# Patient Record
Sex: Female | Born: 1960
Health system: Southern US, Community
[De-identification: ages and names within clinical notes are randomized; demographics above are authoritative.]

## PROBLEM LIST (undated history)

## (undated) DIAGNOSIS — M1990 Unspecified osteoarthritis, unspecified site: Secondary | ICD-10-CM

## (undated) DIAGNOSIS — K5792 Diverticulitis of intestine, part unspecified, without perforation or abscess without bleeding: Secondary | ICD-10-CM

## (undated) DIAGNOSIS — F329 Major depressive disorder, single episode, unspecified: Secondary | ICD-10-CM

## (undated) DIAGNOSIS — M25551 Pain in right hip: Secondary | ICD-10-CM

## (undated) DIAGNOSIS — F419 Anxiety disorder, unspecified: Secondary | ICD-10-CM

## (undated) DIAGNOSIS — R51 Headache: Secondary | ICD-10-CM

## (undated) DIAGNOSIS — M549 Dorsalgia, unspecified: Secondary | ICD-10-CM

## (undated) DIAGNOSIS — M5030 Other cervical disc degeneration, unspecified cervical region: Secondary | ICD-10-CM

## (undated) DIAGNOSIS — M542 Cervicalgia: Secondary | ICD-10-CM

## (undated) DIAGNOSIS — G8929 Other chronic pain: Secondary | ICD-10-CM

## (undated) DIAGNOSIS — K219 Gastro-esophageal reflux disease without esophagitis: Secondary | ICD-10-CM

## (undated) DIAGNOSIS — D649 Anemia, unspecified: Secondary | ICD-10-CM

## (undated) DIAGNOSIS — K859 Acute pancreatitis without necrosis or infection, unspecified: Secondary | ICD-10-CM

## (undated) DIAGNOSIS — M797 Fibromyalgia: Secondary | ICD-10-CM

## (undated) DIAGNOSIS — R519 Headache, unspecified: Secondary | ICD-10-CM

## (undated) DIAGNOSIS — I10 Essential (primary) hypertension: Secondary | ICD-10-CM

## (undated) DIAGNOSIS — F32A Depression, unspecified: Secondary | ICD-10-CM

## (undated) HISTORY — DX: Acute pancreatitis without necrosis or infection, unspecified: K85.90

## (undated) HISTORY — PX: OOPHORECTOMY: SHX86

## (undated) HISTORY — DX: Dorsalgia, unspecified: M54.9

## (undated) HISTORY — DX: Other chronic pain: G89.29

## (undated) HISTORY — DX: Cervicalgia: M54.2

---

## 2011-04-19 ENCOUNTER — Emergency Department (HOSPITAL_COMMUNITY)
Admission: EM | Admit: 2011-04-19 | Discharge: 2011-04-19 | Disposition: A | Discharge status: Home / Self Care | Payer: BC Managed Care – PPO | Attending: Emergency Medicine | Admitting: Emergency Medicine

## 2011-04-19 ENCOUNTER — Encounter (HOSPITAL_COMMUNITY): Payer: Self-pay

## 2011-04-19 DIAGNOSIS — L259 Unspecified contact dermatitis, unspecified cause: Secondary | ICD-10-CM | POA: Insufficient documentation

## 2011-04-19 HISTORY — DX: Depression, unspecified: F32.A

## 2011-04-19 HISTORY — DX: Major depressive disorder, single episode, unspecified: F32.9

## 2011-04-19 MED ORDER — PREDNISONE 10 MG PO TABS: ORAL_TABLET | ORAL | Status: DC

## 2011-04-19 MED ORDER — DIPHENHYDRAMINE HCL 25 MG PO TABS: ORAL_TABLET | ORAL | Status: DC

## 2011-04-19 MED ORDER — PREDNISONE 20 MG PO TABS
60.0000 mg | ORAL_TABLET | Freq: Once | ORAL | Status: AC
  Administered 2011-04-19: 60 mg via ORAL
  Filled 2011-04-19: qty 3

## 2011-04-19 MED ORDER — DIPHENHYDRAMINE HCL 25 MG PO CAPS
25.0000 mg | ORAL_CAPSULE | Freq: Once | ORAL | Status: AC
  Administered 2011-04-19: 25 mg via ORAL
  Filled 2011-04-19: qty 1

## 2011-04-19 MED ORDER — PREDNISONE 20 MG PO TABS: ORAL_TABLET | ORAL | Status: DC

## 2011-04-19 MED ORDER — PREDNISONE 10 MG PO TABS: 20.0000 mg | ORAL_TABLET | Freq: Every day | ORAL | Status: DC

## 2011-04-19 NOTE — Discharge Instructions (Signed)
Contact Dermatitis Contact dermatitis is a reaction to certain substances that touch the skin. Contact dermatitis can be either irritant contact dermatitis or allergic contact dermatitis. Irritant contact dermatitis does not require previous exposure to the substance for a reaction to occur.Allergic contact dermatitis only occurs if you have been exposed to the substance before. Upon a repeat exposure, your body reacts to the substance.  CAUSES  Many substances can cause contact dermatitis. Irritant dermatitis is most commonly caused by repeated exposure to mildly irritating substances, such as:  Makeup.   Soaps.   Detergents.   Bleaches.   Acids.   Metal salts, such as nickel.  Allergic contact dermatitis is most commonly caused by exposure to:  Poisonous plants.   Chemicals (deodorants, shampoos).   Jewelry.   Latex.   Neomycin in triple antibiotic cream.   Preservatives in products, including clothing.  SYMPTOMS  The area of skin that is exposed may develop:  Dryness or flaking.   Redness.   Cracks.   Itching.   Pain or a burning sensation.   Blisters.  With allergic contact dermatitis, there may also be swelling in areas such as the eyelids, mouth, or genitals.  DIAGNOSIS  Your caregiver can usually tell what the problem is by doing a physical exam. In cases where the cause is uncertain and an allergic contact dermatitis is suspected, a patch skin test may be performed to help determine the cause of your dermatitis. TREATMENT Treatment includes protecting the skin from further contact with the irritating substance by avoiding that substance if possible. Barrier creams, powders, and gloves may be helpful. Your caregiver may also recommend:  Steroid creams or ointments applied 2 times daily. For best results, soak the rash area in cool water for 20 minutes. Then apply the medicine. Cover the area with a plastic wrap. You can store the steroid cream in the  refrigerator for a "chilly" effect on your rash. That may decrease itching. Oral steroid medicines may be needed in more severe cases.   Antibiotics or antibacterial ointments if a skin infection is present.   Antihistamine lotion or an antihistamine taken by mouth to ease itching.   Lubricants to keep moisture in your skin.   Burow's solution to reduce redness and soreness or to dry a weeping rash. Mix one packet or tablet of solution in 2 cups cool water. Dip a clean washcloth in the mixture, wring it out a bit, and put it on the affected area. Leave the cloth in place for 30 minutes. Do this as often as possible throughout the day.   Taking several cornstarch or baking soda baths daily if the area is too large to cover with a washcloth.  Harsh chemicals, such as alkalis or acids, can cause skin damage that is like a burn. You should flush your skin for 15 to 20 minutes with cold water after such an exposure. You should also seek immediate medical care after exposure. Bandages (dressings), antibiotics, and pain medicine may be needed for severely irritated skin.  HOME CARE INSTRUCTIONS  Avoid the substance that caused your reaction.   Keep the area of skin that is affected away from hot water, soap, sunlight, chemicals, acidic substances, or anything else that would irritate your skin.   Do not scratch the rash. Scratching may cause the rash to become infected.   You may take cool baths to help stop the itching.   Only take over-the-counter or prescription medicines as directed by your caregiver.     See your caregiver for follow-up care as directed to make sure your skin is healing properly.  SEEK MEDICAL CARE IF:   Your condition is not better after 3 days of treatment.   You seem to be getting worse.   You see signs of infection such as swelling, tenderness, redness, soreness, or warmth in the affected area.   You have any problems related to your medicines.  Document Released:  12/20/1999 Document Revised: 12/11/2010 Document Reviewed: 05/27/2010 [facility] Patient Information 2012 [place], [place].   Take your next dose of prednisone tomorrow afternoon and continue to use the Benadryl as needed for relief of itching.  You may also try topical Benadryl for times when you don't want to be drowsy as Benadryl can make you pretty sleepy.  Please call Dr. Margo Aye for further evaluation of this rash if this is not improving.  Expect gradual improvement over the next 5 days, if no improvement is noted please call for recheck appointment.

## 2011-04-19 NOTE — ED Notes (Signed)
Pt presents with red, hot, burning spots on right arm, ribs, buttocks, hip, and head. Pt believes symptoms are shingles.

## 2011-04-21 NOTE — ED Provider Notes (Signed)
History     CSN: [id number]  Arrival date & time 04/19/11  1417   First MD Initiated Contact with Patient 04/19/11 1511      Chief Complaint  Patient presents with  . Possible Shingles     (Consider location/radiation/quality/duration/timing/severity/associated sxs/prior treatment) HPI Comments: Patient presents for evaluation of rash.  She describes a chronic sore papular rash on her posterior neck and in her scalp which has been present for months.  She does endorse increased stress and finds herself picking at her scalp very frequently and does wonder if she is causing self-inflicted lesions to her scalp.  However, she has had a new rash developing on her right lateral chest, her right arm, and right hip and right buttock area.  These new areas started this past week and has been itchy.  She has used calamine lotion with no relief of symptoms.  Patient is a 51 y.o. female presenting with rash. The history is provided by the patient.  Rash  This is a new problem. The current episode started more than 2 days ago. The problem has been gradually worsening. The problem is associated with an unknown (She has spent more time outdoors, but no known exposures to poison ivy or poison oak.) factor. There has been no fever. The rash is present on the scalp, right arm and right buttock (Also affecting right lateral thigh and right chest wall.). The pain is at a severity of 5/10. Associated symptoms include itching, pain and weeping. Associated symptoms comments: The rash on her right arm specifically has been draining clear fluid, but has cleared since applying calamine lotion.. The treatment provided mild relief.    Past Medical History  Diagnosis Date  . Depression     History reviewed. No pertinent past surgical history.  No family history on file.  History  Substance Use Topics  . Smoking status: Never Smoker   . Smokeless tobacco: Not on file  . Alcohol Use: No    OB History    Grav  Para Term Preterm Abortions TAB SAB Ect Mult Living                  Review of Systems  Constitutional: Negative for fever.  HENT: Negative for congestion, sore throat and neck pain.   Eyes: Negative.   Respiratory: Negative for chest tightness and shortness of breath.   Cardiovascular: Negative for chest pain.  Gastrointestinal: Negative for nausea and abdominal pain.  Genitourinary: Negative.   Musculoskeletal: Negative for joint swelling and arthralgias.  Skin: Positive for itching and rash. Negative for wound.  Neurological: Negative for dizziness, weakness, light-headedness, numbness and headaches.  Hematological: Negative.   Psychiatric/Behavioral: Negative.     Allergies  Review of patient's allergies indicates no known allergies.  Home Medications   Current Outpatient Rx  Name Route Sig Dispense Refill  . DIPHENHYDRAMINE HCL 25 MG PO TABS  Take one or two tablets every 6 hours if needed for itching. 30 tablet 0  . PREDNISONE 10 MG PO TABS  Take 6 tabs daily by mouth for 1 day,  Then 5 tabs daily for 2 days,  4 tabs daily for 2 days,  3 tabs daily for 2 days,  2 tabs daily for 2 days,  Then 1 tab daily for 2 days. 36 tablet 0    BP 119/73  Pulse 96  Temp(Src) 98 F (36.7 C) (Oral)  Resp 18  Ht 5\' 3"  (1.6 m)  Wt 150 lb (68.04 kg)  BMI 26.57 kg/m2  SpO2 98%  LMP 04/03/2011  Physical Exam  Nursing note and vitals reviewed. Constitutional: She is oriented to person, place, and time. She appears well-developed and well-nourished.  HENT:  Head: Normocephalic and atraumatic.  Eyes: Conjunctivae are normal.  Neck: Normal range of motion.  Cardiovascular: Normal rate, regular rhythm, normal heart sounds and intact distal pulses.   Pulmonary/Chest: Effort normal and breath sounds normal. She has no wheezes.  Abdominal: Soft. Bowel sounds are normal. There is no tenderness.  Musculoskeletal: Normal range of motion.  Neurological: She is alert and oriented to person,  place, and time.  Skin: Skin is warm and dry.       Small scattered papules noted on posterior scalp and along the hairline at posterior neck most of them scabbed over with no drainage, surrounding erythema pustules or induration.  Linear mostly stable off vesicular-like rash on right volar forearm.  No current drainage, calamine lotion in place.  Similar lesions noted on right lateral thigh and.  Psychiatric: She has a normal mood and affect.    ED Course  Procedures (including critical care time)  Labs Reviewed - No data to display No results found.   1. Contact dermatitis       MDM  Suspect contact dermatitis such as poison ivy or poison oak on patient's right forearm and thigh.  Chronic papular lesions on scalp of unclear etiology.  Could very likely be a chronic condition from nervous habit scratching and picking at scalp.  No signs suggesting infectious etiology.  Patient also denies any new chemicals, shampoos or hair products.  She will be treated with prednisone and Benadryl for the contact dermatitis-like lesions on her arm and thigh.  She may need to see dermatology if her scalp lesions do not clear.  She has been referred for followup as needed.       Candis Musa, PA 04/21/11 1844

## 2011-04-21 NOTE — ED Provider Notes (Signed)
Medical screening examination/treatment/procedure(s) were performed by non-physician practitioner and as supervising physician I was immediately available for consultation/collaboration.   Jonita Hirota, MD 04/21/11 1931 

## 2012-07-22 ENCOUNTER — Ambulatory Visit: Payer: BC Managed Care – PPO | Admitting: *Deleted

## 2013-01-07 ENCOUNTER — Emergency Department (HOSPITAL_COMMUNITY)
Admission: EM | Admit: 2013-01-07 | Discharge: 2013-01-07 | Disposition: A | Discharge status: Home / Self Care | Payer: No Typology Code available for payment source | Attending: Emergency Medicine | Admitting: Emergency Medicine

## 2013-01-07 ENCOUNTER — Encounter (HOSPITAL_COMMUNITY): Payer: Self-pay | Admitting: Emergency Medicine

## 2013-01-07 DIAGNOSIS — Z791 Long term (current) use of non-steroidal anti-inflammatories (NSAID): Secondary | ICD-10-CM | POA: Insufficient documentation

## 2013-01-07 DIAGNOSIS — I10 Essential (primary) hypertension: Secondary | ICD-10-CM | POA: Insufficient documentation

## 2013-01-07 DIAGNOSIS — R519 Headache, unspecified: Secondary | ICD-10-CM

## 2013-01-07 DIAGNOSIS — F329 Major depressive disorder, single episode, unspecified: Secondary | ICD-10-CM | POA: Insufficient documentation

## 2013-01-07 DIAGNOSIS — F3289 Other specified depressive episodes: Secondary | ICD-10-CM | POA: Insufficient documentation

## 2013-01-07 DIAGNOSIS — R51 Headache: Secondary | ICD-10-CM | POA: Insufficient documentation

## 2013-01-07 DIAGNOSIS — Z79899 Other long term (current) drug therapy: Secondary | ICD-10-CM | POA: Insufficient documentation

## 2013-01-07 DIAGNOSIS — F411 Generalized anxiety disorder: Secondary | ICD-10-CM | POA: Insufficient documentation

## 2013-01-07 DIAGNOSIS — R609 Edema, unspecified: Secondary | ICD-10-CM | POA: Insufficient documentation

## 2013-01-07 HISTORY — DX: Anxiety disorder, unspecified: F41.9

## 2013-01-07 HISTORY — DX: Essential (primary) hypertension: I10

## 2013-01-07 MED ORDER — DIAZEPAM 5 MG PO TABS: 5.0000 mg | ORAL_TABLET | Freq: Four times a day (QID) | ORAL | Status: DC | PRN

## 2013-01-07 MED ORDER — OXYCODONE-ACETAMINOPHEN 5-325 MG PO TABS
1.0000 | ORAL_TABLET | Freq: Once | ORAL | Status: AC
  Administered 2013-01-07: 1 via ORAL
  Filled 2013-01-07: qty 1

## 2013-01-07 MED ORDER — PROMETHAZINE HCL 25 MG/ML IJ SOLN
12.5000 mg | Freq: Once | INTRAMUSCULAR | Status: AC
  Administered 2013-01-07: 12.5 mg via INTRAMUSCULAR
  Filled 2013-01-07: qty 1

## 2013-01-07 MED ORDER — OXYCODONE-ACETAMINOPHEN 5-325 MG PO TABS: 1.0000 | ORAL_TABLET | Freq: Four times a day (QID) | ORAL | Status: DC | PRN

## 2013-01-07 NOTE — Discharge Instructions (Signed)
Continue to take the Mobic for inflammation. Take the Percocet and Valium as needed for pain and muscle spasms.

## 2013-01-07 NOTE — ED Notes (Signed)
Pt c/o "knot" to left posterior head area that she noticed a few days ago, pt states that the pain is causing her to have a headache and nausea, denies any injury. Dr. Alvino Chapel at bedside, see edp assessment for further,

## 2013-01-07 NOTE — ED Notes (Signed)
Pt c/o pain/swelling to left posterior head. Pt also c/o nausea. nad noted.

## 2013-01-07 NOTE — ED Provider Notes (Signed)
CSN: [id number]     Arrival date & time 01/07/13  1258 History  This chart was scribed for [employer]. Alvino Chapel, MD,  by Stacy Gardner, ED Scribe. The patient was seen in room APA02/APA02 and the patient's care was started at 2:46 PM.    First MD Initiated Contact with Patient 01/07/13 1440     Chief Complaint  Patient presents with  . Edema  . Nausea   (Consider location/radiation/quality/duration/timing/severity/associated sxs/prior Treatment) The history is provided by the patient, medical records and a relative. No language interpreter was used.   HPI Comments: Brianna [name] is a 53 y.o. female who presents to the Emergency Department complaining of edema to the left base of her head that presented three days ago.  Pt states the edema feels like a "toothache" that radiates to the top of her head.  Pt has tried Excedrin Migraine for symptoms but she is not experiencing any relief. She has also tried massaging the area and applying cool compresses without any relief.She has the associated symptoms of nausea that presented today. Pt [facility] and he is currently taking medications to treat it.  She has a hx of migraines but she denies the pain is similar in nature. Pt denies vomiting.  Past Medical History  Diagnosis Date  . Depression   . Hypertension   . Anxiety    Past Surgical History  Procedure Laterality Date  . C sec    . Cesarean section    . Oophorectomy     No family history on file. History  Substance Use Topics  . Smoking status: Never Smoker   . Smokeless tobacco: Not on file  . Alcohol Use: No   OB History   Grav Para Term Preterm Abortions TAB SAB Ect Mult Living                 Review of Systems  Gastrointestinal: Positive for nausea. Negative for vomiting.  Skin:       Edema to the base of her head  All other systems reviewed and are negative.    Allergies  Review of patient's allergies indicates no known  allergies.  Home Medications   Current Outpatient Rx  Name  Route  Sig  Dispense  Refill  . citalopram (CELEXA) 20 MG tablet   Oral   Take 20 mg by mouth daily.         . hydrochlorothiazide (HYDRODIURIL) 25 MG tablet   Oral   Take 25 mg by mouth daily.         . meloxicam (MOBIC) 7.5 MG tablet   Oral   Take 7.5 mg by mouth daily.         . pantoprazole (PROTONIX) 40 MG tablet   Oral   Take 40 mg by mouth daily.         . diazepam (VALIUM) 5 MG tablet   Oral   Take 1 tablet (5 mg total) by mouth every 6 (six) hours as needed for muscle spasms.   10 tablet   0   . oxyCODONE-acetaminophen (PERCOCET/ROXICET) 5-325 MG per tablet   Oral   Take 1-2 tablets by mouth every 6 (six) hours as needed for severe pain.   8 tablet   0    BP 123/73  Pulse 77  Temp(Src) 97.5 F (36.4 C) (Oral)  Resp 20  Ht 5\' 3"  (1.6 m)  Wt 170 lb (77.111 kg)  BMI 30.12  kg/m2  SpO2 100% Physical Exam  Nursing note and vitals reviewed. Constitutional: She is oriented to person, place, and time. She appears well-developed and well-nourished. No distress.  HENT:  Head: Normocephalic and atraumatic.  Mouth/Throat: Oropharynx is clear and moist.  Tenderness to the base of the left occipital region of the skull  Eyes: Conjunctivae are normal. Pupils are equal, round, and reactive to light. No scleral icterus.  Neck: Neck supple.  Cardiovascular: Normal rate, regular rhythm, normal heart sounds and intact distal pulses.   No murmur heard. Pulmonary/Chest: Effort normal and breath sounds normal. No stridor. No respiratory distress. She has no rales.  Abdominal: Soft. Bowel sounds are normal. She exhibits no distension. There is no tenderness.  Musculoskeletal: Normal range of motion.  Neurological: She is alert and oriented to person, place, and time.  Skin: Skin is warm and dry. No rash noted.   No rash  Psychiatric: She has a normal mood and affect. Her behavior is normal.    ED  Course  Procedures (including critical care time) DIAGNOSTIC STUDIES: Oxygen Saturation is 99% on room air, normal by my interpretation.    COORDINATION OF CARE:  2:50 PM Discussed course of care with pt . Pt understands and agrees.   Labs Review Labs Reviewed - No data to display Imaging Review No results found.  EKG Interpretation   None       MDM   1. Headache    Patient with headache. Likely muscle spasm there is tenderness in the occipital area. Worse with movement. Feels better after treatment. The nausea is likely related to the pain and headache. Feels better after treatment and will be discharged home I personally performed the services described in this documentation, which was scribed in my presence. The recorded information has been reviewed and is accurate.      Jasper Riling. Alvino Chapel, MD 01/07/13 [id number]

## 2013-02-02 ENCOUNTER — Emergency Department (HOSPITAL_COMMUNITY): Payer: No Typology Code available for payment source

## 2013-02-02 ENCOUNTER — Encounter (HOSPITAL_COMMUNITY): Payer: Self-pay | Admitting: Emergency Medicine

## 2013-02-02 ENCOUNTER — Emergency Department (HOSPITAL_COMMUNITY)
Admission: EM | Admit: 2013-02-02 | Discharge: 2013-02-02 | Disposition: A | Discharge status: Home / Self Care | Payer: No Typology Code available for payment source | Attending: Emergency Medicine | Admitting: Emergency Medicine

## 2013-02-02 DIAGNOSIS — F411 Generalized anxiety disorder: Secondary | ICD-10-CM | POA: Insufficient documentation

## 2013-02-02 DIAGNOSIS — F3289 Other specified depressive episodes: Secondary | ICD-10-CM | POA: Insufficient documentation

## 2013-02-02 DIAGNOSIS — M25559 Pain in unspecified hip: Secondary | ICD-10-CM | POA: Insufficient documentation

## 2013-02-02 DIAGNOSIS — F329 Major depressive disorder, single episode, unspecified: Secondary | ICD-10-CM | POA: Insufficient documentation

## 2013-02-02 DIAGNOSIS — I10 Essential (primary) hypertension: Secondary | ICD-10-CM | POA: Insufficient documentation

## 2013-02-02 DIAGNOSIS — R51 Headache: Secondary | ICD-10-CM | POA: Insufficient documentation

## 2013-02-02 DIAGNOSIS — M129 Arthropathy, unspecified: Secondary | ICD-10-CM | POA: Insufficient documentation

## 2013-02-02 DIAGNOSIS — M25551 Pain in right hip: Secondary | ICD-10-CM

## 2013-02-02 DIAGNOSIS — K219 Gastro-esophageal reflux disease without esophagitis: Secondary | ICD-10-CM | POA: Insufficient documentation

## 2013-02-02 DIAGNOSIS — G8929 Other chronic pain: Secondary | ICD-10-CM | POA: Insufficient documentation

## 2013-02-02 DIAGNOSIS — R519 Headache, unspecified: Secondary | ICD-10-CM

## 2013-02-02 DIAGNOSIS — Z792 Long term (current) use of antibiotics: Secondary | ICD-10-CM | POA: Insufficient documentation

## 2013-02-02 DIAGNOSIS — G8921 Chronic pain due to trauma: Secondary | ICD-10-CM | POA: Insufficient documentation

## 2013-02-02 DIAGNOSIS — Z79899 Other long term (current) drug therapy: Secondary | ICD-10-CM | POA: Insufficient documentation

## 2013-02-02 DIAGNOSIS — Z791 Long term (current) use of non-steroidal anti-inflammatories (NSAID): Secondary | ICD-10-CM | POA: Insufficient documentation

## 2013-02-02 HISTORY — DX: Headache: R51

## 2013-02-02 HISTORY — DX: Unspecified osteoarthritis, unspecified site: M19.90

## 2013-02-02 HISTORY — DX: Gastro-esophageal reflux disease without esophagitis: K21.9

## 2013-02-02 HISTORY — DX: Pain in right hip: M25.551

## 2013-02-02 HISTORY — DX: Other chronic pain: G89.29

## 2013-02-02 HISTORY — DX: Headache, unspecified: R51.9

## 2013-02-02 MED ORDER — DIPHENHYDRAMINE HCL 50 MG/ML IJ SOLN
50.0000 mg | Freq: Once | INTRAMUSCULAR | Status: AC
  Administered 2013-02-02: 50 mg via INTRAMUSCULAR
  Filled 2013-02-02: qty 1

## 2013-02-02 MED ORDER — METOCLOPRAMIDE HCL 5 MG/ML IJ SOLN
10.0000 mg | Freq: Once | INTRAMUSCULAR | Status: AC
  Administered 2013-02-02: 10 mg via INTRAMUSCULAR
  Filled 2013-02-02: qty 2

## 2013-02-02 MED ORDER — KETOROLAC TROMETHAMINE 60 MG/2ML IM SOLN
60.0000 mg | Freq: Once | INTRAMUSCULAR | Status: AC
  Administered 2013-02-02: 60 mg via INTRAMUSCULAR
  Filled 2013-02-02: qty 2

## 2013-02-02 MED ORDER — METHOCARBAMOL 500 MG PO TABS: 1000.0000 mg | ORAL_TABLET | Freq: Four times a day (QID) | ORAL | Status: DC | PRN

## 2013-02-02 NOTE — ED Notes (Signed)
Patient with no complaints at this time. Respirations even and unlabored. Skin warm/dry. Discharge instructions reviewed with patient at this time. Patient given opportunity to voice concerns/ask questions. Patient discharged at this time and left Emergency Department with steady gait.   

## 2013-02-02 NOTE — ED Provider Notes (Signed)
CSN: [id number]     Arrival date & time 02/02/13  1126 History   First MD Initiated Contact with Patient 02/02/13 1308     Chief Complaint  Patient presents with  . Headache  . Hip Pain    HPI Pt was seen at 1320. Per pt, c/o gradual onset and persistence of constant acute flair of her chronic migraine headache since last night.  Describes the headache as per her usual chronic migraine headache pain pattern for several years.  Denies headache was sudden or maximal in onset or at any time.  Denies visual changes, no focal motor weakness, no tingling/numbness in extremities, no fevers, no neck pain, no rash.  Pt also c/o gradual onset and persistence of constant right hip "pain" for the past 4 months. Pt states the pain in her hip began after she "slipped" 4 months ago. Denies falling on right hip. Pt has been ambulatory immediately afterwards and since the incident. Pain worsens with palpation of the area and body position changes.States she has [facility], referred to an Orthopedic doctor, but has not f/u due to financial reasons. Denies new injury, no rash, no fevers, no abd pain, no low back pain, no incont/retention of bowel or bladder, no saddle anesthesia, no focal motor weakness, no tingling/numbness in extremities.    Past Medical History  Diagnosis Date  . Depression   . Hypertension   . Anxiety   . GERD (gastroesophageal reflux disease)   . Headache   . Arthritis   . Chronic right hip pain since 09/2012   Past Surgical History  Procedure Laterality Date  . Cesarean section    . Oophorectomy      History  Substance Use Topics  . Smoking status: Never Smoker   . Smokeless tobacco: Not on file  . Alcohol Use: No    Review of Systems ROS: Statement: All systems negative except as marked or noted in the HPI; Constitutional: Negative for fever and chills. ; ; Eyes: Negative for eye pain, redness and discharge. ; ; ENMT: Negative for ear pain,  hoarseness, nasal congestion, sinus pressure and sore throat. ; ; Cardiovascular: Negative for chest pain, palpitations, diaphoresis, dyspnea and peripheral edema. ; ; Respiratory: Negative for cough, wheezing and stridor. ; ; Gastrointestinal: Negative for nausea, vomiting, diarrhea, abdominal pain, blood in stool, hematemesis, jaundice and rectal bleeding. . ; ; Genitourinary: Negative for dysuria, flank pain and hematuria. ; ; Musculoskeletal: +right hip pain. Negative for back pain and neck pain. Negative for swelling and deformity.; ; Skin: Negative for pruritus, rash, abrasions, blisters, bruising and skin lesion.; ; Neuro: +headache. Negative for lightheadedness and neck stiffness. Negative for weakness, altered level of consciousness , altered mental status, extremity weakness, paresthesias, involuntary movement, seizure and syncope.      Allergies  Review of patient's allergies indicates no known allergies.  Home Medications   Current Outpatient Rx  Name  Route  Sig  Dispense  Refill  . cephALEXin (KEFLEX) 500 MG capsule   Oral   Take 500 mg by mouth 3 (three) times daily. Starting 01/09/2013 x 10 days.         . citalopram (CELEXA) 40 MG tablet   Oral   Take 40 mg by mouth daily.         . hydrochlorothiazide (HYDRODIURIL) 25 MG tablet   Oral   Take 25 mg by mouth daily.         Marland Kitchen ibuprofen (ADVIL,MOTRIN)  200 MG tablet   Oral   Take 400 mg by mouth every 6 (six) hours as needed for headache or moderate pain.         . meloxicam (MOBIC) 7.5 MG tablet   Oral   Take 7.5 mg by mouth daily.         . pantoprazole (PROTONIX) 40 MG tablet   Oral   Take 40 mg by mouth daily.          BP 107/82  Pulse 82  Temp(Src) 97.2 F (36.2 C) (Oral)  Resp 16  SpO2 99% Physical Exam 1325: Physical examination:  Nursing notes reviewed; Vital signs and O2 SAT reviewed;  Constitutional: Well developed, Well nourished, Well hydrated, In no acute distress; Head:  Normocephalic,  atraumatic; Eyes: EOMI, PERRL, No scleral icterus; ENMT: Mouth and pharynx normal, Mucous membranes moist; Neck: Supple, Full range of motion, No lymphadenopathy. No meningeal signs.; Cardiovascular: Regular rate and rhythm, No gallop; Respiratory: Breath sounds clear & equal bilaterally, No wheezes.  Speaking full sentences with ease, Normal respiratory effort/excursion; Chest: Nontender, Movement normal; Abdomen: Soft, Nontender, Nondistended, Normal bowel sounds; Genitourinary: No CVA tenderness; Spine:  No midline CS, TS, LS tenderness. +TTP hypertonic left cervical paraspinal muscles;; Extremities: Pulses normal, Pelvis stable. No deformity. No rash. No tenderness, No edema, No calf edema or asymmetry.; Neuro: AA&Ox3, Major CN grossly intact.  Speech clear. Strength 5/5 equal bilat UE's and LE's, including great toe dorsiflexion.  DTR 2/4 equal bilat UE's and LE's.  No gross sensory deficits.  Neg straight leg raises bilat. Climbs on and off stretcher easily by herself. Gait steady..; Skin: Color normal, Warm, Dry.   ED Course  Procedures    EKG Interpretation   None       MDM  MDM Reviewed: previous chart, nursing note and vitals Interpretation: x-ray    Dg Hip Complete Right 02/02/2013   CLINICAL DATA:  Fall  EXAM: RIGHT HIP - COMPLETE 2+ VIEW  COMPARISON:  None.  FINDINGS: No fracture or dislocation.  Unremarkable soft tissues.  IMPRESSION: No acute bony pathology.   Electronically Signed   By: Maryclare Bean M.D.   On: 02/02/2013 14:25    1540:  No acute findings on XR; will have pt f/u with Ortho. Headache improving after meds and wants to go home now. Hypertonic left cervical paraspinal muscles on exam that pt describes as "that feels like a knot." No LAN, no rash, neck supple. No red flags on H&P; will continue to tx symptomatically. Dx and testing d/w pt.  Questions answered.  Verb understanding, agreeable to d/c home with outpt f/u.   Alfonzo Feller, DO 02/05/13 [phone]

## 2013-02-02 NOTE — ED Notes (Signed)
persistent headache since beginning of this month and reports, " knot to back of my left head. [facility] and they reported they thought it was a swollen lymph node. My headaches are getting worse" c/o blurred vision. Pt also reports rt hip pain. Ambulated to triage without difficulty.

## 2013-02-02 NOTE — Discharge Instructions (Signed)
°Emergency Department Resource Guide °1) Find a Doctor and Pay Out of Pocket °Although you won't have to find out who is covered by your insurance plan, it is a good idea to ask around and get recommendations. You will then need to call the office and see if the doctor you have chosen will accept you as a new patient and what types of options they offer for patients who are self-pay. Some doctors offer discounts or will set up payment plans for their patients who do not have insurance, but you will need to ask so you aren't surprised when you get to your appointment. ° °2) Contact Your Local Health Department °Not all health departments have doctors that can see patients for sick visits, but many do, so it is worth a call to see if yours does. If you don't know where your local health department is, you can check in your phone book. The CDC also has a tool to help you locate your state's health department, and many state websites also have listings of all of their local health departments. ° °3) Find a Walk-in Clinic °If your illness is not likely to be very severe or complicated, you may want to try a walk in clinic. These are popping up all over the country in pharmacies, drugstores, and shopping centers. They're usually staffed by nurse practitioners or physician assistants that have been trained to treat common illnesses and complaints. They're usually fairly quick and inexpensive. However, if you have serious medical issues or chronic medical problems, these are probably not your best option. ° °No Primary Care Doctor: °- Call [facility] Connect at  832-8000 - they can help you locate a primary care doctor that  accepts your insurance, provides certain services, etc. °- Physician Referral Service- 1-800-533-3463 ° °Chronic Pain Problems: °Organization         Address  Phone   Notes  °[facility]  (336) 297-2271 Patients need to be referred by their primary care doctor.  ° °Medication  Assistance: °Organization         Address  Phone   Notes  °[place] [facility] [address] °[place], [place] [address] (336) 641-8030 --Must be a resident of [place] °-- Must have NO insurance coverage whatsoever (no Medicaid/ Medicare, etc.) °-- The pt. MUST have a primary care doctor that directs their care regularly and follows them in the community °  °[facility]  (866) 331-1348   °[employer]  (888) 892-1162   ° °Agencies that provide inexpensive medical care: °Organization         Address  Phone   Notes  °[facility]  (336) 832-8035   °[facility]    (336) 832-7272   °[facility] [address] °[place], [place] [address] (336) 832-4777   °[facility] [address], °[place] (336) 271-4999   °[facility]    (336) 373-0678   °[facility]    (336) 272-1050   °[facility] ° [address], [place] Phone:  (336) 832-4444, Fax:  (336) 832-4440 Hours of Operation:  9 am - 6 pm, M-F.  Also accepts Medicaid/Medicare and self-pay.  °[facility] ° [address], [place] Phone: (336) 832-3150, Fax: (336) [phone]. Hours of Operation:  8:30 am - 5:30 pm, M-F.  Also accepts Medicaid and self-pay.  °[facility] [place] [address], [place] Phone: (336) 878-6027   °[facility] [address], [place], [place] (336)723-1848, Ext. 123 Mondays & Thursdays: 7-9 AM.  First 15 patients are seen on a first come, first serve basis. °  ° °Medicaid-accepting [place] Providers: ° °Organization         Address  Phone   Notes  °[facility] [address], [place] (336) 641-2100 Also accepts self-pay patients.  °[facility] [address], [place] ° (336) 856-9996   °[facility] [address], [place]  (336) 288-8857   °[facility] [address] [place] [address], [place] (336) 299-7000   °Veita Bland [address], [place]  ° (336) 373-1557 Only accepts [place] Access Medicaid patients after they have their name applied to their card.  ° °Self-Pay (no insurance) in [place]: ° °Organization         Address  Phone   Notes  °Sickle Cell Patients, [facility] [address], [place] (336) 832-1970   °[facility] [address], [place] (336) 832-4400   °[facility] ° [address], [place] (336) 992-4800   °[facility]/Dr. Osei-Bonsu ° [address] [place] [address], [place] or [address], [place] (336) 841-8500 Phone number for both [place] and [place] locations is the same.  °[facility] [address], [place] (336) 299-0000   °[facility] [address] [place] [address], [place] or [address] (336) 852-7530 °(336) 878-2260   °[facility] [address], [place] (336) 350-1642, phone; (336) 294-5005, fax Sees patients 1st and 3rd Saturday of every month.  Must not qualify for public or private insurance (i.e. Medicaid, Medicare, [place] Health Choice, Veterans' Benefits) • Household income should be no more than 200% of the poverty level •The clinic cannot treat you if you are pregnant or think you are pregnant • Sexually transmitted diseases are not treated at the clinic.  ° ° °Dental Care: °Organization         Address  Phone  Notes  °[place] [facility] [address], [place] (336) 641-6152 Accepts children up to age 21 who are enrolled in Medicaid or [place] Health Choice; pregnant women with a Medicaid card; and children who have applied for Medicaid or [place] Health Choice, but were declined, whose parents can pay a reduced fee at time of service.  °[place]  [facility] [place]  [address], [place] (336) 641-7733 Accepts children up to age 21 who are enrolled in Medicaid or [place] Health Choice; pregnant women with a Medicaid card; and children who have applied for Medicaid or [place] Health Choice, but were declined, whose parents can pay a reduced fee at time of service.  °[facility] ° [address], [place] (336) 641-4533 Patients are seen by appointment only. Walk-ins are not accepted. [facility] will see patients 18 years of age and older. °Monday - Tuesday (8am-5pm) °Most Wednesdays (8:30-5pm) °$30 per visit, cash only  °[facility] ° [address], [place] (336) 641-4533 Patients are seen by appointment only. Walk-ins are not accepted. [facility] will see patients 18 years of age and older. °One   Wednesday Evening (Monthly: Volunteer Based).  $30 per visit, cash only  °[facility]  (919) 537-3737 for adults; Children under age 4, call Graduate Pediatric Dentistry at (919) [phone]. Children aged 4-14, please call (919) [phone] to request a pediatric application. ° Dental services are provided in all areas of dental care including fillings, crowns and bridges, complete and partial dentures, implants, gum treatment, root canals, and extractions. Preventive care is also provided. Treatment is provided to both adults and children. °Patients are selected via a lottery and there is often a waiting list. °  °[facility] [address], °[place] ° (336) 763-8833 [email or web address] °  °[facility] [address], [place], [place] (336)723-1848, Ext. 123 Second and Fourth Thursday of each month, opens at 6:30 AM; Clinic ends at 9 AM.  Patients are seen on a first-come first-served basis, and a limited number are seen during each clinic.  ° °[facility] ° [address], [place], [place] (336) 723-7904    Eligibility Requirements °You must have lived in [place], or [place] counties for at least the last three months. °  You cannot be eligible for state or federal sponsored healthcare insurance, including Veterans Administration, Medicaid, or Medicare. °  You generally cannot be eligible for healthcare insurance through your employer.  °  How to apply: °Eligibility screenings are held every Tuesday and Wednesday afternoon from 1:00 pm until 4:00 pm. You do not need an appointment for the interview!  °[facility] [address], [place], [place] 336-631-2330   °[facility]  336-342-8273   °[facility]  336-703-3100   °[facility]  336-570-6415   ° °Behavioral Health Resources in the Community: °Intensive Outpatient Programs °Organization         Address  Phone  Notes  °[facility] [address], [place], [place] 336-878-6098   °[facility] [address], [place], [place] 336-832-9800   °[facility] [address], [place], [place] ° 336-882-2125   °[facility] [address],  °[place], [place] 1-800-853-5163 or 336-641-4981   °Substance Abuse Resources °Organization         Address  Phone  Notes  °[facility]  336-882-2125   °[facility]  336-784-9470   °[facility]  336-285-9073   °[facility]  336-845-3988   °Residential & Outpatient Substance Abuse Program  1-800-659-3381   °Psychological Services °Organization         Address  Phone  Notes  °[facility]  336- 832-9600   °[facility]  336- 378-7881   °[facility] [address], [place] 1-800-853-5163 or 336-641-4981   ° °Mobile Crisis Teams °Organization         Address  Phone  Notes  °[facility], Mobile Crisis Care Unit  1-877-626-1772   °Assertive °[facility] ° [address]  [place], [place] 336-834-9664   °Sharon DeEsch [address] °[place] 336-554-5454   ° °Self-Help/Support Groups °Organization         Address  Phone             Notes  °[facility] [place] - variety of support groups  336- 373-1402 Call for more information  °Narcotics Anonymous (NA), [facility] [address], °[place]  2 meetings at this location  ° °  Residential Treatment Programs Organization         Address  Phone  Notes  [facility] [address]    [place]  1-(458) 830-6292   [facility]  [address] [place] [address], [place], [place]   [place], [place] (207)400-2968 Admissions: 8am-3pm M-F  [facility] [address]    [place], [place] 062-376-2831   [facility] [address] [place], [place], [place]   [facility] [address],  [place], [place]   [facility] Intensive Outpatient [address], [place], [place]   [facility] [address].,  [place], [place] 1-5701108564 or 646-813-3354   [facility] [address] [place], [place] Accepts Medicaid  [facility] [address]  [place] 1-847-784-5047 Substance Abuse/Addiction Treatment   [facility]         Address  Phone  Notes  [facility]  878-553-6768   Domenic Schwab, PhD [address] [place], [place]   (726)328-4804 or 281-465-7204   [facility] [place], [place] 445-711-4545   [facility] [address], [place], [place] (562)464-2062 Insurance/Medicaid/sponsorship through [facility] [address] [place]                                    [place], [place] [phone] [place] [address] [place] [address], [place] [phone]    Dr. Adele Schilder  [phone]   [facility]. 1) [address] [place] 2) [place] 3)  [address], [place] [phone]  [phone]   [place] 830-296-1851 or (737) 703-3446 (After Hours)       Take the prescription as directed.  Apply moist heat or ice to the area(s) of discomfort, for 15 minutes at a time, several times per day for the next few days.  Do not fall asleep on a heating or ice pack.  Take over the counter tylenol and ibuprofen (OR Excedrin) and benadryl, as directed on packaging, with the prescription given to you today, as needed for headache.  Keep a headache diary, as discussed.  Call your regular medical doctor today to schedule a follow up appointment within the next 3  days.  Call the Orthopedic doctor and the Headache Management doctor today to schedule a follow up appointment within the next week. Return to the Emergency Department immediately sooner if worsening.

## 2013-07-27 ENCOUNTER — Encounter (HOSPITAL_COMMUNITY): Payer: Self-pay | Admitting: Emergency Medicine

## 2013-07-27 ENCOUNTER — Emergency Department (HOSPITAL_COMMUNITY): Payer: No Typology Code available for payment source

## 2013-07-27 ENCOUNTER — Emergency Department (HOSPITAL_COMMUNITY)
Admission: EM | Admit: 2013-07-27 | Discharge: 2013-07-27 | Disposition: A | Discharge status: Home / Self Care | Payer: No Typology Code available for payment source | Attending: Emergency Medicine | Admitting: Emergency Medicine

## 2013-07-27 DIAGNOSIS — R1084 Generalized abdominal pain: Secondary | ICD-10-CM

## 2013-07-27 DIAGNOSIS — K219 Gastro-esophageal reflux disease without esophagitis: Secondary | ICD-10-CM | POA: Insufficient documentation

## 2013-07-27 DIAGNOSIS — F3289 Other specified depressive episodes: Secondary | ICD-10-CM | POA: Insufficient documentation

## 2013-07-27 DIAGNOSIS — F329 Major depressive disorder, single episode, unspecified: Secondary | ICD-10-CM | POA: Insufficient documentation

## 2013-07-27 DIAGNOSIS — R109 Unspecified abdominal pain: Secondary | ICD-10-CM | POA: Insufficient documentation

## 2013-07-27 DIAGNOSIS — Z791 Long term (current) use of non-steroidal anti-inflammatories (NSAID): Secondary | ICD-10-CM | POA: Insufficient documentation

## 2013-07-27 DIAGNOSIS — G8929 Other chronic pain: Secondary | ICD-10-CM | POA: Insufficient documentation

## 2013-07-27 DIAGNOSIS — F411 Generalized anxiety disorder: Secondary | ICD-10-CM | POA: Insufficient documentation

## 2013-07-27 DIAGNOSIS — Z3202 Encounter for pregnancy test, result negative: Secondary | ICD-10-CM | POA: Insufficient documentation

## 2013-07-27 DIAGNOSIS — Z79899 Other long term (current) drug therapy: Secondary | ICD-10-CM | POA: Insufficient documentation

## 2013-07-27 DIAGNOSIS — Z9889 Other specified postprocedural states: Secondary | ICD-10-CM | POA: Insufficient documentation

## 2013-07-27 DIAGNOSIS — I10 Essential (primary) hypertension: Secondary | ICD-10-CM | POA: Insufficient documentation

## 2013-07-27 DIAGNOSIS — M129 Arthropathy, unspecified: Secondary | ICD-10-CM | POA: Insufficient documentation

## 2013-07-27 LAB — URINALYSIS, ROUTINE W REFLEX MICROSCOPIC
BILIRUBIN URINE: NEGATIVE
Glucose, UA: NEGATIVE mg/dL
KETONES UR: NEGATIVE mg/dL
Leukocytes, UA: NEGATIVE
NITRITE: NEGATIVE
PROTEIN: NEGATIVE mg/dL
Specific Gravity, Urine: 1.025 (ref 1.005–1.030)
UROBILINOGEN UA: 0.2 mg/dL (ref 0.0–1.0)
pH: 5 (ref 5.0–8.0)

## 2013-07-27 LAB — COMPREHENSIVE METABOLIC PANEL
ALT: 30 U/L (ref 0–35)
ANION GAP: 12 (ref 5–15)
AST: 30 U/L (ref 0–37)
Albumin: 3.6 g/dL (ref 3.5–5.2)
Alkaline Phosphatase: 90 U/L (ref 39–117)
BILIRUBIN TOTAL: 0.3 mg/dL (ref 0.3–1.2)
BUN: 21 mg/dL (ref 6–23)
CALCIUM: 9.6 mg/dL (ref 8.4–10.5)
CHLORIDE: 106 meq/L (ref 96–112)
CO2: 25 meq/L (ref 19–32)
CREATININE: 0.85 mg/dL (ref 0.50–1.10)
GFR calc Af Amer: 90 mL/min — ABNORMAL LOW (ref >90)
GFR, EST NON AFRICAN AMERICAN: 77 mL/min — AB (ref >90)
Glucose, Bld: 95 mg/dL (ref 70–99)
Potassium: 4.4 mEq/L (ref 3.7–5.3)
Sodium: 143 mEq/L (ref 137–147)
Total Protein: 7.2 g/dL (ref 6.0–8.3)

## 2013-07-27 LAB — CBC WITH DIFFERENTIAL/PLATELET
BASOS PCT: 1 % (ref 0–1)
Basophils Absolute: 0 10*3/uL (ref 0.0–0.1)
EOS PCT: 2 % (ref 0–5)
Eosinophils Absolute: 0.1 10*3/uL (ref 0.0–0.7)
HEMATOCRIT: 38.8 % (ref 36.0–46.0)
HEMOGLOBIN: 13.4 g/dL (ref 12.0–15.0)
LYMPHS PCT: 45 % (ref 12–46)
Lymphs Abs: 2.4 10*3/uL (ref 0.7–4.0)
MCH: 29.7 pg (ref 26.0–34.0)
MCHC: 34.5 g/dL (ref 30.0–36.0)
MCV: 86 fL (ref 78.0–100.0)
MONO ABS: 0.5 10*3/uL (ref 0.1–1.0)
MONOS PCT: 10 % (ref 3–12)
NEUTROS ABS: 2.2 10*3/uL (ref 1.7–7.7)
Neutrophils Relative %: 42 % — ABNORMAL LOW (ref 43–77)
Platelets: 289 10*3/uL (ref 150–400)
RBC: 4.51 MIL/uL (ref 3.87–5.11)
RDW: 12.6 % (ref 11.5–15.5)
WBC: 5.3 10*3/uL (ref 4.0–10.5)

## 2013-07-27 LAB — PREGNANCY, URINE: Preg Test, Ur: NEGATIVE

## 2013-07-27 LAB — URINE MICROSCOPIC-ADD ON

## 2013-07-27 LAB — LIPASE, BLOOD: LIPASE: 19 U/L (ref 11–59)

## 2013-07-27 MED ORDER — ONDANSETRON HCL 4 MG/2ML IJ SOLN
4.0000 mg | Freq: Once | INTRAMUSCULAR | Status: AC
  Administered 2013-07-27: 4 mg via INTRAVENOUS
  Filled 2013-07-27: qty 2

## 2013-07-27 MED ORDER — GI COCKTAIL ~~LOC~~
30.0000 mL | Freq: Once | ORAL | Status: AC
  Administered 2013-07-27: 30 mL via ORAL
  Filled 2013-07-27: qty 30

## 2013-07-27 MED ORDER — ONDANSETRON 4 MG PO TBDP: 4.0000 mg | ORAL_TABLET | Freq: Three times a day (TID) | ORAL | Status: DC | PRN

## 2013-07-27 MED ORDER — SODIUM CHLORIDE 0.9 % IV SOLN
1000.0000 mL | Freq: Once | INTRAVENOUS | Status: AC
  Administered 2013-07-27: 1000 mL via INTRAVENOUS

## 2013-07-27 NOTE — ED Notes (Signed)
Pt states pain was better for 20 minutes following GI cocktail, but now pain has returned.

## 2013-07-27 NOTE — ED Notes (Signed)
Pt alert & oriented x4, stable gait. Patient given discharge instructions, paperwork & prescription(s). Patient  instructed to stop at the registration desk to finish any additional paperwork. Patient verbalized understanding. Pt left department w/ no further questions. 

## 2013-07-27 NOTE — ED Provider Notes (Signed)
CSN: [id number]     Arrival date & time 07/27/13  1232 History   First MD Initiated Contact with Patient 07/27/13 1235     Chief Complaint  Patient presents with  . Abdominal Pain     HPI  Patient presents with concerns of ongoing abdominal pain, nausea, anorexia, weight gain. Symptoms have been present for approximately 7 months and she presents today 2 to sleeplessness last night, increasing discomfort. She denies new fevers, chills, vomiting. She had some relief previously with PPI, but symptoms have come progressive recently. She has seen her primary care physicians, no specialists. She states that prior to the onset of symptoms she was generally well.     Past Medical History  Diagnosis Date  . Depression   . Hypertension   . Anxiety   . GERD (gastroesophageal reflux disease)   . Headache   . Arthritis   . Chronic right hip pain since 09/2012   Past Surgical History  Procedure Laterality Date  . Cesarean section    . Oophorectomy     History reviewed. No pertinent family history. History  Substance Use Topics  . Smoking status: Never Smoker   . Smokeless tobacco: Not on file  . Alcohol Use: No   OB History   Grav Para Term Preterm Abortions TAB SAB Ect Mult Living                 Review of Systems  Constitutional:       Per HPI, otherwise negative  HENT:       Per HPI, otherwise negative  Respiratory:       Per HPI, otherwise negative  Cardiovascular:       Per HPI, otherwise negative  Gastrointestinal: Negative for vomiting.  Endocrine:       Negative aside from HPI  Genitourinary:       Neg aside from HPI   Musculoskeletal:       Per HPI, otherwise negative  Skin: Negative.   Neurological: Negative for syncope.      Allergies  Review of patient's allergies indicates no known allergies.  Home Medications   Prior to Admission medications   Medication Sig Start Date End Date Taking? Authorizing Provider  citalopram (CELEXA) 40 MG tablet  Take 40 mg by mouth daily.   Yes Historical Provider, MD  hydrochlorothiazide (HYDRODIURIL) 25 MG tablet Take 25 mg by mouth daily.   Yes Historical Provider, MD  meloxicam (MOBIC) 7.5 MG tablet Take 7.5 mg by mouth daily.   Yes Historical Provider, MD  pantoprazole (PROTONIX) 40 MG tablet Take 40 mg by mouth daily.   Yes Historical Provider, MD   BP 134/86  Pulse 67  Temp(Src) 97.1 F (36.2 C) (Oral)  Resp 17  Ht 5\' 3"  (1.6 m)  Wt 180 lb (81.647 kg)  BMI 31.89 kg/m2  SpO2 99% Physical Exam  Nursing note and vitals reviewed. Constitutional: She is oriented to person, place, and time. She appears well-developed and well-nourished. No distress.  HENT:  Head: Normocephalic and atraumatic.  Eyes: Conjunctivae and EOM are normal.  Cardiovascular: Normal rate and regular rhythm.   Pulmonary/Chest: Effort normal and breath sounds normal. No stridor. No respiratory distress.  Abdominal: She exhibits no distension. There is no hepatosplenomegaly. There is tenderness in the epigastric area. There is no rigidity, no rebound and no guarding.  Musculoskeletal: She exhibits no edema.  Neurological: She is alert and oriented to person, place, and time. No cranial nerve deficit.  Skin:  Skin is warm and dry.  Psychiatric: She has a normal mood and affect.    ED Course  Procedures (including critical care time) Labs Review Labs Reviewed  CBC WITH DIFFERENTIAL - Abnormal; Notable for the following:    Neutrophils Relative % 42 (*)    All other components within normal limits  COMPREHENSIVE METABOLIC PANEL - Abnormal; Notable for the following:    GFR calc non Af Amer 77 (*)    GFR calc Af Amer 90 (*)    All other components within normal limits  URINALYSIS, ROUTINE W REFLEX MICROSCOPIC - Abnormal; Notable for the following:    Hgb urine dipstick TRACE (*)    All other components within normal limits  URINE MICROSCOPIC-ADD ON - Abnormal; Notable for the following:    Squamous Epithelial / LPF  FEW (*)    All other components within normal limits  LIPASE, BLOOD  PREGNANCY, URINE    Imaging Review Dg Abd Acute W/chest  07/27/2013   CLINICAL DATA:  Abdominal pain and constipation  EXAM: ACUTE ABDOMEN SERIES (ABDOMEN 2 VIEW & CHEST 1 VIEW)  COMPARISON:  02/23/2013  FINDINGS: Normal heart size.  Minimal atelectasis at the lateral left base.  No free intraperitoneal gas. No disproportionate dilatation of bowel. Phleboliths project over the pelvis.  IMPRESSION: Negative abdominal radiographs.  No acute cardiopulmonary disease.   Electronically Signed   By: Maryclare Bean M.D.   On: 07/27/2013 14:55     EKG Interpretation   Date/Time:  Thursday July 27 2013 12:43:22 EDT Ventricular Rate:  69 PR Interval:  147 QRS Duration: 93 QT Interval:  426 QTC Calculation: 456 R Axis:   30 Text Interpretation:  Sinus rhythm Low voltage, precordial leads Sinus  rhythm Low voltage QRS Artifact Abnormal ekg Confirmed by Carmin Muskrat   MD ([id number]) on 07/27/2013 1:09:25 PM     3:12 PM On repeat exam the patient is awake and alert, and in no distress.  I discussed all findings with the patient and her husband.  MDM   Patient presents with almost half year of abdominal pain.  The patient is awake and alert, was soft, non-peritoneal abdomen, no fever, no evidence of distress. Patient's evaluation here is largely reassuring, with only minor lab abnormalities. Patient describes no urinary symptoms, no vomiting, diarrhea or other suggestion of occult infection. With reassuring findings, the patient was provided referral to gastroenterology for further evaluation and management.  Carmin Muskrat, MD 07/27/13 [phone]

## 2013-07-27 NOTE — ED Notes (Signed)
Pt co upper abdominal/epigastric pain x 2 months, [facility]. Pt states burning is worse after eating or drinking. Pt states she has been nauseated and has "hot spells" often. Denies emesis at this time.

## 2013-07-27 NOTE — Discharge Instructions (Signed)
As discussed, your evaluation today has been largely reassuring.  But, it is important that you monitor your condition carefully, and do not hesitate to return to the ED if you develop new, or concerning changes in your condition. ° °Otherwise, please follow-up with your physician for appropriate ongoing care. ° ° °Abdominal Pain °Many things can cause abdominal pain. Usually, abdominal pain is not caused by a disease and will improve without treatment. It can often be observed and treated at home. Your health care provider will do a physical exam and possibly order blood tests and X-rays to help determine the seriousness of your pain. However, in many cases, more time must pass before a clear cause of the pain can be found. Before that point, your health care provider may not know if you need more testing or further treatment. °HOME CARE INSTRUCTIONS  °Monitor your abdominal pain for any changes. The following actions may help to alleviate any discomfort you are experiencing: °· Only take over-the-counter or prescription medicines as directed by your health care provider. °· Do not take laxatives unless directed to do so by your health care provider. °· Try a clear liquid diet (broth, tea, or water) as directed by your health care provider. Slowly move to a bland diet as tolerated. °SEEK MEDICAL CARE IF: °· You have unexplained abdominal pain. °· You have abdominal pain associated with nausea or diarrhea. °· You have pain when you urinate or have a bowel movement. °· You experience abdominal pain that wakes you in the night. °· You have abdominal pain that is worsened or improved by eating food. °· You have abdominal pain that is worsened with eating fatty foods. °· You have a fever. °SEEK IMMEDIATE MEDICAL CARE IF:  °· Your pain does not go away within 2 hours. °· You keep throwing up (vomiting). °· Your pain is felt only in portions of the abdomen, such as the right side or the left lower portion of the  abdomen. °· You pass bloody or black tarry stools. °MAKE SURE YOU: °· Understand these instructions.   °· Will watch your condition.   °· Will get help right away if you are not doing well or get worse.   °Document Released: 10/01/2004 Document Revised: 12/27/2012 Document Reviewed: 08/31/2012 °ExitCare® Patient Information ©2015 [employer]. This information is not intended to replace advice given to you by your health care provider. Make sure you discuss any questions you have with your health care provider. ° °

## 2013-08-03 ENCOUNTER — Telehealth: Payer: Self-pay | Admitting: Gastroenterology

## 2013-08-03 NOTE — Telephone Encounter (Signed)
Patient is scheduled here on 09/04/13 first available, shes been in the ER 3 times, c/o abdominal pain/burning/swelling/blood in stool/constipation, NEW PATIENT, ER  Referral

## 2013-08-04 NOTE — Telephone Encounter (Signed)
I called and scheduled pt for first available urgent OV on 08/11/2013 at 10:30 AM. I told her to keep her phone close by and if we have cancellation early in week next week we can give her a call.

## 2013-08-04 NOTE — Telephone Encounter (Signed)
Offer urgent ov

## 2013-08-04 NOTE — Telephone Encounter (Signed)
I called pt. She said she has a lot of heartburn when she eats, and hurts in the chest and all the way down.  Has a lot of swelling. Irregular BM's and yesterday she saw bright red blood in stool x 1 .  Brianna [name] she is just miserable and would like appt earlier than 09/04/2013.  She said she has ben to the ED at [facility] x 2 in the last several months. She also went to [facility] ED. They did a CT and it was normal. Please advise!

## 2013-08-04 NOTE — Telephone Encounter (Signed)
I have added pt to the schedule on 8/7 at 1030 with AS and cancelled previous OV

## 2013-08-11 ENCOUNTER — Ambulatory Visit (INDEPENDENT_AMBULATORY_CARE_PROVIDER_SITE_OTHER): Payer: Self-pay | Admitting: Gastroenterology

## 2013-08-11 ENCOUNTER — Encounter: Payer: Self-pay | Admitting: Gastroenterology

## 2013-08-11 ENCOUNTER — Other Ambulatory Visit: Payer: Self-pay | Admitting: Internal Medicine

## 2013-08-11 VITALS — BP 106/75 | HR 77 | Temp 98.1°F | Ht 63.0 in | Wt 180.2 lb

## 2013-08-11 DIAGNOSIS — G8929 Other chronic pain: Secondary | ICD-10-CM

## 2013-08-11 DIAGNOSIS — R1013 Epigastric pain: Secondary | ICD-10-CM

## 2013-08-11 DIAGNOSIS — K219 Gastro-esophageal reflux disease without esophagitis: Secondary | ICD-10-CM

## 2013-08-11 DIAGNOSIS — K5900 Constipation, unspecified: Secondary | ICD-10-CM

## 2013-08-11 DIAGNOSIS — Z1211 Encounter for screening for malignant neoplasm of colon: Secondary | ICD-10-CM

## 2013-08-11 MED ORDER — PEG 3350-KCL-NA BICARB-NACL 420 G PO SOLR: 4000.0000 mL | ORAL | Status: DC

## 2013-08-11 MED ORDER — LINACLOTIDE 145 MCG PO CAPS: 145.0000 ug | ORAL_CAPSULE | Freq: Every day | ORAL | Status: DC

## 2013-08-11 NOTE — Progress Notes (Signed)
Primary Care Physician:  No PCP Per Patient Primary Gastroenterologist:  Dr. Gala Romney   Chief Complaint  Patient presents with  . Abdominal Pain    HPI:   Brianna [name] is a 53 year old female referred by the ED secondary to abdominal pain, nausea.. Symptoms over a year. Every time she eats notes "hurting, burning, or uncomfortable". Has gone from a size 8 to a 14 over the past year. States she is bloated, abdomen feels swollen. Told to take fiber. On Protonix once daily since Jan 2015.  Abdominal pain located in epigastric region, feels like something is stuck in there and can't breathe. Notes significant esophageal burning, feels like entire upper abdomen is burning. Any time she eats, it "blows up". Feels nauseated all the time. No significant vomiting. Nausea worsened in past 6 months. Notes history of chronic intermittent constipation. Mostly hard balls, has to manually disimpact. Had blood in stool a few times last week.  No melena. No prior colonoscopy or EGD.   Mobic daily. Every once in awhile will take Excedrin migraine.   Past Medical History  Diagnosis Date  . Depression   . Hypertension   . Anxiety   . GERD (gastroesophageal reflux disease)   . Headache   . Arthritis   . Chronic right hip pain since 09/2012    Past Surgical History  Procedure Laterality Date  . Cesarean section    . Oophorectomy      Current Outpatient Prescriptions  Medication Sig Dispense Refill  . citalopram (CELEXA) 40 MG tablet Take 40 mg by mouth daily.      . hydrochlorothiazide (HYDRODIURIL) 25 MG tablet Take 25 mg by mouth daily.      . meloxicam (MOBIC) 7.5 MG tablet Take 7.5 mg by mouth daily.      . pantoprazole (PROTONIX) 40 MG tablet Take 40 mg by mouth daily.       No current facility-administered medications for this visit.    Allergies as of 08/11/2013  . (No Known Allergies)    Family History  Problem Relation Age of Onset  . Colon cancer Neg Hx   . Ovarian  cancer Mother     History   Social History  . Marital Status: Widowed    Spouse Name: N/A    Number of Children: N/A  . Years of Education: N/A   Occupational History  .      hasn't worked since October 2014. Filing for disability   Social History Main Topics  . Smoking status: Never Smoker   . Smokeless tobacco: Not on file     Comment: Quit in 2009  . Alcohol Use: No  . Drug Use: No  . Sexual Activity: Not on file   Other Topics Concern  . Not on file   Social History Narrative  . No narrative on file    Review of Systems: Gen: see HPI CV: Denies chest pain, heart palpitations, peripheral edema, syncope.  Resp: Denies shortness of breath at rest or with exertion. Denies wheezing or cough.  GI: see HPI GU : Denies urinary burning, urinary frequency, urinary hesitancy MS: right hip pain Derm: Denies rash, itching, dry skin Psych: has anxiety attacks Heme: Denies bruising, bleeding, and enlarged lymph nodes.  Physical Exam: BP 106/75  Pulse 77  Temp(Src) 98.1 F (36.7 C) (Oral)  Ht 5\' 3"  (1.6 m)  Wt 180 lb 3.2 oz (81.738 kg)  BMI 31.93 kg/m2 General:   Alert and oriented.  Pleasant and cooperative. Well-nourished and well-developed.  Head:  Normocephalic and atraumatic. Eyes:  Without icterus, sclera clear and conjunctiva pink.  Ears:  Normal auditory acuity. Nose:  No deformity, discharge,  or lesions. Mouth:  No deformity or lesions, oral mucosa pink.  Lungs:  Clear to auscultation bilaterally. No wheezes, rales, or rhonchi. No distress.  Heart:  S1, S2 present without murmurs appreciated.  Abdomen:  +BS, soft, TTP diffusely but without peritoneal signs.  non-distended. No HSM noted. No guarding or rebound. No masses appreciated.  Rectal:  Deferred  Msk:  Symmetrical without gross deformities. Normal posture. Extremities:  Without clubbing or edema. Neurologic:  Alert and  oriented x4;  grossly normal neurologically. Skin:  Intact without significant  lesions or rashes. Psych:  Alert and cooperative. Normal mood and affect.  Lab Results  Component Value Date   WBC 5.3 07/27/2013   HGB 13.4 07/27/2013   HCT 38.8 07/27/2013   MCV 86.0 07/27/2013   PLT 289 07/27/2013   Lab Results  Component Value Date   ALT 30 07/27/2013   AST 30 07/27/2013   ALKPHOS 90 07/27/2013   BILITOT 0.3 07/27/2013   Imaging at [facility]: requested records.

## 2013-08-11 NOTE — Patient Instructions (Signed)
For reflux: stop Protonix. Start samples of Dexilant once daily each morning. We have provided patient assistance forms to complete for this.  For constipation: start Linzess 1 capsule each morning, 30 minutes before breakfast to avoid diarrhea. We have provided a voucher for a month supply free.   Start taking IBgard daily or as needed.   Please complete blood work when [employer] is processed.  We have scheduled you for a colonoscopy and upper endoscopy with Dr. Gala Romney in the near future!

## 2013-08-14 NOTE — Assessment & Plan Note (Signed)
Start Dexilant once daily.

## 2013-08-14 NOTE — Assessment & Plan Note (Signed)
With low-volume hematochezia, likely benign anorectal source. No prior colonoscopy . Linzess 145 mcg daily prescribed.   Proceed with TCS with Dr. Gala Romney in near future: the risks, benefits, and alternatives have been discussed with the patient in detail. The patient states understanding and desires to proceed.

## 2013-08-14 NOTE — Assessment & Plan Note (Addendum)
53 year old female with epigastric pain since at least Jan 2015, exacerbated significantly by eating and associated with chronic underlying nausea. Bloating symptoms significant. No improvement with Protonix daily since Jan 2015. On Mobic daily. Differentials broad, especially with chronic constipation underlying. In setting of NSAIDs, query gastritis, possible PUD, possible abdominal pain and bloating secondary to chronic constipation, less likely biliary etiology but gallbladder does remain in situ. Needs EGD for further evaluation along with changing PPI and aggressive constipation regimen.   Start Dexilant once daily. Samples provided and patient assistance forms due to lack of insurance (has [employer]).  Linzess 145 mcg voucher. Patient assistance forms provided. Trial of IBgard Obtain outside imaging from [facility] Proceed with upper endoscopy in the near future with Dr. Gala Romney. The risks, benefits, and alternatives have been discussed in detail with patient. They have stated understanding and desire to proceed.

## 2013-08-15 ENCOUNTER — Encounter (HOSPITAL_COMMUNITY): Payer: Self-pay | Admitting: Pharmacy Technician

## 2013-08-15 NOTE — Progress Notes (Signed)
NO PCP

## 2013-08-16 ENCOUNTER — Encounter (HOSPITAL_COMMUNITY): Payer: Self-pay

## 2013-08-22 ENCOUNTER — Encounter (HOSPITAL_COMMUNITY)
Admission: RE | Disposition: A | Discharge status: Home / Self Care | Payer: Self-pay | Source: Ambulatory Visit | Attending: Internal Medicine

## 2013-08-22 ENCOUNTER — Ambulatory Visit (HOSPITAL_COMMUNITY)
Admission: RE | Admit: 2013-08-22 | Discharge: 2013-08-22 | Disposition: A | Discharge status: Home / Self Care | Payer: Self-pay | Source: Ambulatory Visit | Attending: Internal Medicine | Admitting: Internal Medicine

## 2013-08-22 ENCOUNTER — Encounter (HOSPITAL_COMMUNITY): Payer: Self-pay | Admitting: *Deleted

## 2013-08-22 DIAGNOSIS — R1013 Epigastric pain: Secondary | ICD-10-CM | POA: Insufficient documentation

## 2013-08-22 DIAGNOSIS — K449 Diaphragmatic hernia without obstruction or gangrene: Secondary | ICD-10-CM | POA: Insufficient documentation

## 2013-08-22 DIAGNOSIS — F341 Dysthymic disorder: Secondary | ICD-10-CM | POA: Insufficient documentation

## 2013-08-22 DIAGNOSIS — K5730 Diverticulosis of large intestine without perforation or abscess without bleeding: Secondary | ICD-10-CM | POA: Insufficient documentation

## 2013-08-22 DIAGNOSIS — K219 Gastro-esophageal reflux disease without esophagitis: Secondary | ICD-10-CM

## 2013-08-22 DIAGNOSIS — K2100 Gastro-esophageal reflux disease with esophagitis, without bleeding: Secondary | ICD-10-CM | POA: Insufficient documentation

## 2013-08-22 DIAGNOSIS — Z79899 Other long term (current) drug therapy: Secondary | ICD-10-CM | POA: Insufficient documentation

## 2013-08-22 DIAGNOSIS — K222 Esophageal obstruction: Secondary | ICD-10-CM | POA: Insufficient documentation

## 2013-08-22 DIAGNOSIS — K3189 Other diseases of stomach and duodenum: Secondary | ICD-10-CM | POA: Insufficient documentation

## 2013-08-22 DIAGNOSIS — Z1211 Encounter for screening for malignant neoplasm of colon: Secondary | ICD-10-CM

## 2013-08-22 DIAGNOSIS — K921 Melena: Secondary | ICD-10-CM | POA: Insufficient documentation

## 2013-08-22 DIAGNOSIS — K648 Other hemorrhoids: Secondary | ICD-10-CM | POA: Insufficient documentation

## 2013-08-22 DIAGNOSIS — K5900 Constipation, unspecified: Secondary | ICD-10-CM | POA: Insufficient documentation

## 2013-08-22 DIAGNOSIS — R110 Nausea: Secondary | ICD-10-CM | POA: Insufficient documentation

## 2013-08-22 HISTORY — PX: ESOPHAGOGASTRODUODENOSCOPY: SHX5428

## 2013-08-22 HISTORY — PX: COLONOSCOPY: SHX5424

## 2013-08-22 SURGERY — COLONOSCOPY
Anesthesia: Moderate Sedation

## 2013-08-22 MED ORDER — MEPERIDINE HCL 100 MG/ML IJ SOLN
INTRAMUSCULAR | Status: DC | PRN
  Administered 2013-08-22: 50 mg via INTRAVENOUS
  Administered 2013-08-22: 25 mg via INTRAVENOUS

## 2013-08-22 MED ORDER — ONDANSETRON HCL 4 MG/2ML IJ SOLN
INTRAMUSCULAR | Status: DC | PRN
  Administered 2013-08-22: 4 mg via INTRAVENOUS

## 2013-08-22 MED ORDER — ONDANSETRON HCL 4 MG/2ML IJ SOLN
INTRAMUSCULAR | Status: AC
  Filled 2013-08-22: qty 2

## 2013-08-22 MED ORDER — MEPERIDINE HCL 100 MG/ML IJ SOLN
INTRAMUSCULAR | Status: AC
  Filled 2013-08-22: qty 2

## 2013-08-22 MED ORDER — MIDAZOLAM HCL 5 MG/5ML IJ SOLN
INTRAMUSCULAR | Status: DC | PRN
  Administered 2013-08-22 (×4): 1 mg via INTRAVENOUS
  Administered 2013-08-22 (×2): 2 mg via INTRAVENOUS

## 2013-08-22 MED ORDER — MIDAZOLAM HCL 5 MG/5ML IJ SOLN
INTRAMUSCULAR | Status: AC
  Filled 2013-08-22: qty 10

## 2013-08-22 MED ORDER — LIDOCAINE VISCOUS 2 % MT SOLN
OROMUCOSAL | Status: AC
  Filled 2013-08-22: qty 15

## 2013-08-22 MED ORDER — STERILE WATER FOR IRRIGATION IR SOLN
Status: DC | PRN
  Administered 2013-08-22: 13:00:00

## 2013-08-22 MED ORDER — LIDOCAINE VISCOUS 2 % MT SOLN
OROMUCOSAL | Status: DC | PRN
  Administered 2013-08-22: 3 mL via OROMUCOSAL

## 2013-08-22 MED ORDER — SODIUM CHLORIDE 0.9 % IV SOLN
INTRAVENOUS | Status: DC
  Administered 2013-08-22: 12:00:00 via INTRAVENOUS

## 2013-08-22 NOTE — Discharge Instructions (Signed)
Colonoscopy Discharge Instructions  Read the instructions outlined below and refer to this sheet in the next few weeks. These discharge instructions provide you with general information on caring for yourself after you leave the hospital. Your doctor may also give you specific instructions. While your treatment has been planned according to the most current medical practices available, unavoidable complications occasionally occur. If you have any problems or questions after discharge, call Dr. Gala Romney at [phone]. ACTIVITY  You may resume your regular activity, but move at a slower pace for the next 24 hours.   Take frequent rest periods for the next 24 hours.   Walking will help get rid of the air and reduce the bloated feeling in your belly (abdomen).   No driving for 24 hours (because of the medicine (anesthesia) used during the test).    Do not sign any important legal documents or operate any machinery for 24 hours (because of the anesthesia used during the test).  NUTRITION  Drink plenty of fluids.   You may resume your normal diet as instructed by your doctor.   Begin with a light meal and progress to your normal diet. Heavy or fried foods are harder to digest and may make you feel sick to your stomach (nauseated).   Avoid alcoholic beverages for 24 hours or as instructed.  MEDICATIONS  You may resume your normal medications unless your doctor tells you otherwise.  WHAT YOU CAN EXPECT TODAY  Some feelings of bloating in the abdomen.   Passage of more gas than usual.   Spotting of blood in your stool or on the toilet paper.  IF YOU HAD POLYPS REMOVED DURING THE COLONOSCOPY:  No aspirin products for 7 days or as instructed.   No alcohol for 7 days or as instructed.   Eat a soft diet for the next 24 hours.  FINDING OUT THE RESULTS OF YOUR TEST Not all test results are available during your visit. If your test results are not back during the visit, make an appointment  with your caregiver to find out the results. Do not assume everything is normal if you have not heard from your caregiver or the medical facility. It is important for you to follow up on all of your test results.  SEEK IMMEDIATE MEDICAL ATTENTION IF:  You have more than a spotting of blood in your stool.   Your belly is swollen (abdominal distention).   You are nauseated or vomiting.   You have a temperature over 101.  You have abdominal pain or discomfort that is severe or gets worse throughout the day. EGD Discharge instructions Please read the instructions outlined below and refer to this sheet in the next few weeks. These discharge instructions provide you with general information on caring for yourself after you leave the hospital. Your doctor may also give you specific instructions. While your treatment has been planned according to the most current medical practices available, unavoidable complications occasionally occur. If you have any problems or questions after discharge, please call your doctor. ACTIVITY You may resume your regular activity but move at a slower pace for the next 24 hours.  Take frequent rest periods for the next 24 hours.  Walking will help expel (get rid of) the air and reduce the bloated feeling in your abdomen.  No driving for 24 hours (because of the anesthesia (medicine) used during the test).  You may shower.  Do not sign any important legal documents or operate any machinery for 24  hours (because of the anesthesia used during the test).  NUTRITION Drink plenty of fluids.  You may resume your normal diet.  Begin with a light meal and progress to your normal diet.  Avoid alcoholic beverages for 24 hours or as instructed by your caregiver.  MEDICATIONS You may resume your normal medications unless your caregiver tells you otherwise.  WHAT YOU CAN EXPECT TODAY You may experience abdominal discomfort such as a feeling of fullness or gas pains.   FOLLOW-UP Your doctor will discuss the results of your test with you.  SEEK IMMEDIATE MEDICAL ATTENTION IF ANY OF THE FOLLOWING OCCUR: Excessive nausea (feeling sick to your stomach) and/or vomiting.  Severe abdominal pain and distention (swelling).  Trouble swallowing.  Temperature over 101 F (37.8 C).  Rectal bleeding or vomiting of blood.    GERD and constipation information provided  Diverticulosis information provided  Begin Linzess 145 daily as previously recommended  Begin Benefiber 2 teaspoons twice daily  Dexilant 60 mg daily-continue taking  Office visit with [place] in 3 months

## 2013-08-22 NOTE — Op Note (Signed)
[facility] [address] [place], [address]   ENDOSCOPY PROCEDURE REPORT  PATIENT: Brianna, [name]  MR#: [id number] BIRTHDATE: 14-Jun-1960 , 52  yrs. old GENDER: Female ENDOSCOPIST: R.  Garfield Cornea, MD FACP [facility] REFERRED BY:     none PROCEDURE DATE:  08/22/2013 PROCEDURE:     diagnostic EGD  INDICATIONS:    Dyspepsia;  recently improved dramatically with a one-week course of daily Dexilant  INFORMED CONSENT:   The risks, benefits, limitations, alternatives and imponderables have been discussed.  The potential for biopsy, esophogeal dilation, etc. have also been reviewed.  Questions have been answered.  All parties agreeable.  Please see the history and physical in the medical record for more information.  MEDICATIONS:    Versed 5 mg IV and Demerol 75 mg IV in divided doses. Xylocaine gel orally. Zofran 4 mg IV.  DESCRIPTION OF PROCEDURE:   The EG-[id number] ([id number])  endoscope was introduced through the mouth and advanced to the second portion of the duodenum without difficulty or limitations.  The mucosal surfaces were surveyed very carefully during advancement of the scope and upon withdrawal.  Retroflexion view of the proximal stomach and esophagogastric junction was performed.      FINDINGS:  patent /  noncritical Schatzki's ring. 3 quadrant distal esophagealerosions within 4 mm of the GE junction.  no tumor. No Barrett's esophagus. Stomach empty. 3 cm hiatal hernia. Gastric mucosa normal. Patent pylorus.  Normal first and second portion of the duodenum  THERAPEUTIC / DIAGNOSTIC MANEUVERS PERFORMED:  none   COMPLICATIONS:  None  IMPRESSION:  Erosive reflux esophagitis. Noncritical Schatzki's ring and hiatal hernia-not manipulated.  RECOMMENDATIONS:  Continue Dexilant 60 mg daily. See colonoscopy report.    _______________________________ R. Garfield Cornea, MD FACP [facility] eSigned:  R. Garfield Cornea, MD FACP [facility] 08/22/2013 1:23  PM     CC:  PATIENT NAME:  Brianna, [name] MR#: [id number]

## 2013-08-22 NOTE — H&P (View-Only) (Signed)
Primary Care Physician:  No PCP Per Patient Primary Gastroenterologist:  Dr. Gala Romney   Chief Complaint  Patient presents with  . Abdominal Pain    HPI:   Brianna [name] is a 53 year old female referred by the ED secondary to abdominal pain, nausea.. Symptoms over a year. Every time she eats notes "hurting, burning, or uncomfortable". Has gone from a size 8 to a 14 over the past year. States she is bloated, abdomen feels swollen. Told to take fiber. On Protonix once daily since Jan 2015.  Abdominal pain located in epigastric region, feels like something is stuck in there and can't breathe. Notes significant esophageal burning, feels like entire upper abdomen is burning. Any time she eats, it "blows up". Feels nauseated all the time. No significant vomiting. Nausea worsened in past 6 months. Notes history of chronic intermittent constipation. Mostly hard balls, has to manually disimpact. Had blood in stool a few times last week.  No melena. No prior colonoscopy or EGD.   Mobic daily. Every once in awhile will take Excedrin migraine.   Past Medical History  Diagnosis Date  . Depression   . Hypertension   . Anxiety   . GERD (gastroesophageal reflux disease)   . Headache   . Arthritis   . Chronic right hip pain since 09/2012    Past Surgical History  Procedure Laterality Date  . Cesarean section    . Oophorectomy      Current Outpatient Prescriptions  Medication Sig Dispense Refill  . citalopram (CELEXA) 40 MG tablet Take 40 mg by mouth daily.      . hydrochlorothiazide (HYDRODIURIL) 25 MG tablet Take 25 mg by mouth daily.      . meloxicam (MOBIC) 7.5 MG tablet Take 7.5 mg by mouth daily.      . pantoprazole (PROTONIX) 40 MG tablet Take 40 mg by mouth daily.       No current facility-administered medications for this visit.    Allergies as of 08/11/2013  . (No Known Allergies)    Family History  Problem Relation Age of Onset  . Colon cancer Neg Hx   . Ovarian  cancer Mother     History   Social History  . Marital Status: Widowed    Spouse Name: N/A    Number of Children: N/A  . Years of Education: N/A   Occupational History  .      hasn't worked since October 2014. Filing for disability   Social History Main Topics  . Smoking status: Never Smoker   . Smokeless tobacco: Not on file     Comment: Quit in 2009  . Alcohol Use: No  . Drug Use: No  . Sexual Activity: Not on file   Other Topics Concern  . Not on file   Social History Narrative  . No narrative on file    Review of Systems: Gen: see HPI CV: Denies chest pain, heart palpitations, peripheral edema, syncope.  Resp: Denies shortness of breath at rest or with exertion. Denies wheezing or cough.  GI: see HPI GU : Denies urinary burning, urinary frequency, urinary hesitancy MS: right hip pain Derm: Denies rash, itching, dry skin Psych: has anxiety attacks Heme: Denies bruising, bleeding, and enlarged lymph nodes.  Physical Exam: BP 106/75  Pulse 77  Temp(Src) 98.1 F (36.7 C) (Oral)  Ht 5\' 3"  (1.6 m)  Wt 180 lb 3.2 oz (81.738 kg)  BMI 31.93 kg/m2 General:   Alert and oriented.  Pleasant and cooperative. Well-nourished and well-developed.  Head:  Normocephalic and atraumatic. Eyes:  Without icterus, sclera clear and conjunctiva pink.  Ears:  Normal auditory acuity. Nose:  No deformity, discharge,  or lesions. Mouth:  No deformity or lesions, oral mucosa pink.  Lungs:  Clear to auscultation bilaterally. No wheezes, rales, or rhonchi. No distress.  Heart:  S1, S2 present without murmurs appreciated.  Abdomen:  +BS, soft, TTP diffusely but without peritoneal signs.  non-distended. No HSM noted. No guarding or rebound. No masses appreciated.  Rectal:  Deferred  Msk:  Symmetrical without gross deformities. Normal posture. Extremities:  Without clubbing or edema. Neurologic:  Alert and  oriented x4;  grossly normal neurologically. Skin:  Intact without significant  lesions or rashes. Psych:  Alert and cooperative. Normal mood and affect.  Lab Results  Component Value Date   WBC 5.3 07/27/2013   HGB 13.4 07/27/2013   HCT 38.8 07/27/2013   MCV 86.0 07/27/2013   PLT 289 07/27/2013   Lab Results  Component Value Date   ALT 30 07/27/2013   AST 30 07/27/2013   ALKPHOS 90 07/27/2013   BILITOT 0.3 07/27/2013   Imaging at [facility]: requested records.

## 2013-08-22 NOTE — Op Note (Signed)
[facility] [address] [place], [address]   COLONOSCOPY PROCEDURE REPORT  PATIENT: Brianna [name], Brianna [name]  MR#:         [id number] BIRTHDATE: [birth year] , 52  yrs. old GENDER: Female ENDOSCOPIST: R.  Garfield Cornea, MD FACP [facility] REFERRED BY:     none PROCEDURE DATE:  08/22/2013 PROCEDURE:     Diagnostic colonoscopy  INDICATIONS: paper hematochezia; constipation  INFORMED CONSENT:  The risks, benefits, alternatives and imponderables including but not limited to bleeding, perforation as well as the possibility of a missed lesion have been reviewed.  The potential for biopsy, lesion removal, etc. have also been discussed.  Questions have been answered.  All parties agreeable. Please see the history and physical in the medical record for more information.  MEDICATIONS: Versed 8 mg IV and Demerol 75 mg IV in divided doses. Zofran 4 mg IV  DESCRIPTION OF PROCEDURE:  After a digital rectal exam was performed, the EC-[id number] ([id number])  colonoscope was advanced from the anus through the rectum and colon to the area of the cecum, ileocecal valve and appendiceal orifice.  The cecum was deeply intubated.  These structures were well-seen and photographed for the record.  From the level of the cecum and ileocecal valve, the scope was slowly and cautiously withdrawn.  The mucosal surfaces were carefully surveyed utilizing scope tip deflection to facilitate fold flattening as needed.  The scope was pulled down into the rectum where a thorough examination was performed.    FINDINGS:  Adequate preparation. Minimal anal canal hemorrhoids; Rectal vault small-unable to retroflex, however, mucosa seen well on-face otherwise, rectal mucosa appeared normal. Scattered left-sided diverticula; otherwise, the remainder of the colonic mucosa appeared normal.  THERAPEUTIC / DIAGNOSTIC MANEUVERS PERFORMED:  none  COMPLICATIONS: none  CECAL WITHDRAWAL TIME:  6 minutes  IMPRESSION:   Colonic diverticulosis; paper hematochezia likely from benign anorectal irritation.  RECOMMENDATIONS:   Begin Linzess 145 daily as previously recommended. Add Benefiber 2 teaspoons twice a day to her regimen. Office visit in 3 months.   _______________________________ eSigned:  R. Garfield Cornea, MD FACP [facility] 08/22/2013 1:44 PM   CC:

## 2013-08-22 NOTE — Interval H&P Note (Signed)
History and Physical Interval Note:  08/22/2013 1:00 PM  Brianna [name]  has presented today for surgery, with the diagnosis of GERD, ABDOMINAL PAIN, SCREENING COLONOSCOPY  The various methods of treatment have been discussed with the patient and family. After consideration of risks, benefits and other options for treatment, the patient has consented to  Procedure(s) with comments: COLONOSCOPY (N/A) - 1245 ESOPHAGOGASTRODUODENOSCOPY (EGD) (N/A) as a surgical intervention .  The patient's history has been reviewed, patient examined, no change in status, stable for surgery.  I have reviewed the patient's chart and labs.  Questions were answered to the patient's satisfaction.     Brianna [name]  No change. EGD colonoscopy per plan.The risks, benefits, limitations, imponderables and alternatives regarding both EGD and colonoscopy have been reviewed with the patient. Questions have been answered. All parties agreeable.

## 2013-08-25 ENCOUNTER — Encounter (HOSPITAL_COMMUNITY): Payer: Self-pay | Admitting: Internal Medicine

## 2013-08-28 ENCOUNTER — Telehealth: Payer: Self-pay | Admitting: *Deleted

## 2013-08-28 NOTE — Telephone Encounter (Signed)
Gave #3 bottles of dexilant. Pt is aware. She is not sure if she got the patient assistance forms for dexilant or not. I have put more paperwork in the bag with the samples. Pt also requesting information about diverticulosis. Answered her questions and I also included a handout of diverticulosis.

## 2013-08-28 NOTE — Telephone Encounter (Signed)
Pt called stating she only has 5 pills of dexilant left and she would like more. Please advise [phone]

## 2013-08-29 NOTE — Telephone Encounter (Signed)
noted 

## 2013-09-04 ENCOUNTER — Ambulatory Visit: Payer: Self-pay | Admitting: Gastroenterology

## 2013-09-06 ENCOUNTER — Encounter: Payer: Self-pay | Admitting: Internal Medicine

## 2013-09-06 ENCOUNTER — Ambulatory Visit: Payer: Self-pay | Attending: Internal Medicine | Admitting: Internal Medicine

## 2013-09-06 VITALS — BP 120/83 | HR 80 | Temp 98.1°F | Resp 16 | Ht 63.0 in | Wt 178.0 lb

## 2013-09-06 DIAGNOSIS — F3289 Other specified depressive episodes: Secondary | ICD-10-CM | POA: Insufficient documentation

## 2013-09-06 DIAGNOSIS — G8929 Other chronic pain: Secondary | ICD-10-CM | POA: Insufficient documentation

## 2013-09-06 DIAGNOSIS — M129 Arthropathy, unspecified: Secondary | ICD-10-CM | POA: Insufficient documentation

## 2013-09-06 DIAGNOSIS — K219 Gastro-esophageal reflux disease without esophagitis: Secondary | ICD-10-CM | POA: Insufficient documentation

## 2013-09-06 DIAGNOSIS — M25551 Pain in right hip: Secondary | ICD-10-CM

## 2013-09-06 DIAGNOSIS — F411 Generalized anxiety disorder: Secondary | ICD-10-CM | POA: Insufficient documentation

## 2013-09-06 DIAGNOSIS — M545 Low back pain, unspecified: Secondary | ICD-10-CM | POA: Insufficient documentation

## 2013-09-06 DIAGNOSIS — M542 Cervicalgia: Secondary | ICD-10-CM | POA: Insufficient documentation

## 2013-09-06 DIAGNOSIS — M25559 Pain in unspecified hip: Secondary | ICD-10-CM | POA: Insufficient documentation

## 2013-09-06 DIAGNOSIS — F329 Major depressive disorder, single episode, unspecified: Secondary | ICD-10-CM | POA: Insufficient documentation

## 2013-09-06 DIAGNOSIS — R51 Headache: Secondary | ICD-10-CM | POA: Insufficient documentation

## 2013-09-06 DIAGNOSIS — M79609 Pain in unspecified limb: Secondary | ICD-10-CM | POA: Insufficient documentation

## 2013-09-06 DIAGNOSIS — I10 Essential (primary) hypertension: Secondary | ICD-10-CM | POA: Insufficient documentation

## 2013-09-06 MED ORDER — CYCLOBENZAPRINE HCL 10 MG PO TABS: 10.0000 mg | ORAL_TABLET | Freq: Three times a day (TID) | ORAL | Status: DC | PRN

## 2013-09-06 NOTE — Progress Notes (Signed)
Patient ID: Brianna [name], female   DOB: 04/20/1960, 53 y.o.   MRN: [id number]  [id number]:[id number]  [id number]  DOB - [birth year]  CC:  Chief Complaint  Patient presents with  . Establish Care       HPI: Brianna [name] is a 53 y.o. female here today to establish medical care.  Patient reports that she was having severe acid reflux and was sent to GI.  She was found to have diverticulosis and a small hernia causing her to have acid reflux.  She was given dexilant and linezess.  She reports the linzess has been causing diarrhea so she stopped taking it, she plans to follow up with the GI doctor today about this.    Right hip pain--constant pain for 3 months.  Has had normal xrays.  She has been to several doctors about this pain and was given meloxicam.  Now she c/o of left hip pain for three weeks.  Has tried heating pads. Bilateral foot pain when getting up in morning-achy pain.  Lower back pain that feels like it gives way and feels like she may be falling.  Xrays of hip and back in past,  Neck pain as well, had bulging disc in past and has had cortisone injections.  Difficulty sleeping due to pain.  No Known Allergies Past Medical History  Diagnosis Date  . Depression   . Hypertension   . Anxiety   . GERD (gastroesophageal reflux disease)   . Headache   . Arthritis   . Chronic right hip pain since 09/2012   Current Outpatient Prescriptions on File Prior to Visit  Medication Sig Dispense Refill  . citalopram (CELEXA) 40 MG tablet Take 40 mg by mouth daily.      Marland Kitchen dexlansoprazole (DEXILANT) 60 MG capsule Take 60 mg by mouth daily.      . hydrochlorothiazide (HYDRODIURIL) 25 MG tablet Take 25 mg by mouth daily.      . polyethylene glycol-electrolytes (TRILYTE) 420 G solution Take 4,000 mLs by mouth as directed.  4000 mL  0   No current facility-administered medications on file prior to visit.   Family History  Problem Relation Age of Onset  . Colon cancer Neg Hx   . Ovarian  cancer Mother    History   Social History  . Marital Status: Widowed    Spouse Name: N/A    Number of Children: N/A  . Years of Education: N/A   Occupational History  .      hasn't worked since October 2014. Filing for disability   Social History Main Topics  . Smoking status: Never Smoker   . Smokeless tobacco: Not on file     Comment: Quit in 2009  . Alcohol Use: No  . Drug Use: No  . Sexual Activity: Not on file   Other Topics Concern  . Not on file   Social History Narrative  . No narrative on file   Review of Systems  Gastrointestinal: Positive for abdominal pain. Negative for diarrhea and constipation.  Genitourinary: Negative.   Musculoskeletal: Positive for back pain and neck pain. Negative for falls.       Hip pain   Neurological: Negative for dizziness.      Objective:   Filed Vitals:   09/06/13 1139  BP: 120/83  Pulse: 80  Temp: 98.1 F (36.7 C)  Resp: 16   Physical Exam  Cardiovascular: Normal rate, regular rhythm and normal heart sounds.   Pulmonary/Chest: Effort normal and breath  sounds normal.  Abdominal: Soft. Bowel sounds are normal.  Musculoskeletal: She exhibits no edema and no tenderness.  Normal ROM of left hip, patient is very tense with passive ROM of right hip   Skin: Skin is warm.     Lab Results  Component Value Date   WBC 5.3 07/27/2013   HGB 13.4 07/27/2013   HCT 38.8 07/27/2013   MCV 86.0 07/27/2013   PLT 289 07/27/2013   Lab Results  Component Value Date   CREATININE 0.85 07/27/2013   BUN 21 07/27/2013   NA 143 07/27/2013   K 4.4 07/27/2013   CL 106 07/27/2013   CO2 25 07/27/2013    No results found for this basename: HGBA1C   Lipid Panel  No results found for this basename: chol, trig, hdl, cholhdl, vldl, ldlcalc       Assessment and plan:   Kanyia was seen today for establish care.  Diagnoses and associated orders for this visit:  Hip pain, chronic, right - MR Hip Right Wo Contrast;  Future - cyclobenzaprine (FLEXERIL) 10 MG tablet; Take 1 tablet (10 mg total) by mouth 3 (three) times daily as needed for muscle spasms.   Return if symptoms worsen or fail to improve.  The patient was given clear instructions to go to ER or return to medical center if symptoms don't improve, worsen or new problems develop. The patient verbalized understanding.   Chari Manning, NP-C [facility] [phone] 09/11/2013, 2:12 PM

## 2013-09-06 NOTE — Progress Notes (Signed)
Pt is here to establish care. Pt reports having severe chronic pain in her hips and legs. Pt wants to be tested for fibromyalgia.

## 2013-09-06 NOTE — Patient Instructions (Signed)
Osteoarthritis Osteoarthritis is a disease that causes soreness and inflammation of a joint. It occurs when the cartilage at the affected joint wears down. Cartilage acts as a cushion, covering the ends of bones where they meet to form a joint. Osteoarthritis is the most common form of arthritis. It often occurs in older people. The joints affected most often by this condition include those in the:  Ends of the fingers.  Thumbs.  Neck.  Lower back.  Knees.  Hips. CAUSES  Over time, the cartilage that covers the ends of bones begins to wear away. This causes bone to rub on bone, producing pain and stiffness in the affected joints.  RISK FACTORS Certain factors can increase your chances of having osteoarthritis, including:  Older age.  Excessive body weight.  Overuse of joints.  Previous joint injury. SIGNS AND SYMPTOMS   Pain, swelling, and stiffness in the joint.  Over time, the joint may lose its normal shape.  Small deposits of bone (osteophytes) may grow on the edges of the joint.  Bits of bone or cartilage can break off and float inside the joint space. This may cause more pain and damage. DIAGNOSIS  Your health care provider will do a physical exam and ask about your symptoms. Various tests may be ordered, such as:  X-rays of the affected joint.  An MRI scan.  Blood tests to rule out other types of arthritis.  Joint fluid tests. This involves using a needle to draw fluid from the joint and examining the fluid under a microscope. TREATMENT  Goals of treatment are to control pain and improve joint function. Treatment plans may include:  A prescribed exercise program that allows for rest and joint relief.  A weight control plan.  Pain relief techniques, such as:  Properly applied heat and cold.  Electric pulses delivered to nerve endings under the skin (transcutaneous electrical nerve stimulation [TENS]).  Massage.  Certain nutritional  supplements.  Medicines to control pain, such as:  Acetaminophen.  Nonsteroidal anti-inflammatory drugs (NSAIDs), such as naproxen.  Narcotic or central-acting agents, such as tramadol.  Corticosteroids. These can be given orally or as an injection.  Surgery to reposition the bones and relieve pain (osteotomy) or to remove loose pieces of bone and cartilage. Joint replacement may be needed in advanced states of osteoarthritis. HOME CARE INSTRUCTIONS   Take medicines only as directed by your health care provider.  Maintain a healthy weight. Follow your health care provider's instructions for weight control. This may include dietary instructions.  Exercise as directed. Your health care provider can recommend specific types of exercise. These may include:  Strengthening exercises. These are done to strengthen the muscles that support joints affected by arthritis. They can be performed with weights or with exercise bands to add resistance.  Aerobic activities. These are exercises, such as brisk walking or low-impact aerobics, that get your heart pumping.  Range-of-motion activities. These keep your joints limber.  Balance and agility exercises. These help you maintain daily living skills.  Rest your affected joints as directed by your health care provider.  Keep all follow-up visits as directed by your health care provider. SEEK MEDICAL CARE IF:   Your skin turns red.  You develop a rash in addition to your joint pain.  You have worsening joint pain.  You have a fever along with joint or muscle aches. SEEK IMMEDIATE MEDICAL CARE IF:  You have a significant loss of weight or appetite.  You have night sweats. FOR MORE  INFORMATION  °· National Institute of Arthritis and Musculoskeletal and Skin Diseases: www.niams.nih.gov °· National Institute on Aging: www.nia.nih.gov °· American College of Rheumatology: www.rheumatology.org °Document Released: 12/22/2004 Document Revised:  05/08/2013 Document Reviewed: 08/29/2012 °ExitCare® Patient Information ©2015 [employer]. This information is not intended to replace advice given to you by your health care provider. Make sure you discuss any questions you have with your health care provider. °Hip Pain °Your hip is the joint between your upper legs and your lower pelvis. The bones, cartilage, tendons, and muscles of your hip joint perform a lot of work each day supporting your body weight and allowing you to move around. °Hip pain can range from a minor ache to severe pain in one or both of your hips. Pain may be felt on the inside of the hip joint near the groin, or the outside near the buttocks and upper thigh. You may have swelling or stiffness as well.  °HOME CARE INSTRUCTIONS  °· Take medicines only as directed by your health care provider. °· Apply ice to the injured area: °· Put ice in a plastic bag. °· Place a towel between your skin and the bag. °· Leave the ice on for 15-20 minutes at a time, 3-4 times a day. °· Keep your leg raised (elevated) when possible to lessen swelling. °· Avoid activities that cause pain. °· Follow specific exercises as directed by your health care provider. °· Sleep with a pillow between your legs on your most comfortable side. °· Record how often you have hip pain, the location of the pain, and what it feels like. °SEEK MEDICAL CARE IF:  °· You are unable to put weight on your leg. °· Your hip is red or swollen or very tender to touch. °· Your pain or swelling continues or worsens after 1 week. °· You have increasing difficulty walking. °· You have a fever. °SEEK IMMEDIATE MEDICAL CARE IF:  °· You have fallen. °· You have a sudden increase in pain and swelling in your hip. °MAKE SURE YOU:  °· Understand these instructions. °· Will watch your condition. °· Will get help right away if you are not doing well or get worse. °Document Released: 06/11/2009 Document Revised: 05/08/2013 Document Reviewed:  08/18/2012 °ExitCare® Patient Information ©2015 [employer]. This information is not intended to replace advice given to you by your health care provider. Make sure you discuss any questions you have with your health care provider. ° °

## 2013-09-11 ENCOUNTER — Encounter: Payer: Self-pay | Admitting: Internal Medicine

## 2013-09-15 ENCOUNTER — Ambulatory Visit (HOSPITAL_COMMUNITY)
Admission: RE | Admit: 2013-09-15 | Discharge: 2013-09-15 | Disposition: A | Discharge status: Home / Self Care | Payer: No Typology Code available for payment source | Source: Ambulatory Visit | Attending: Internal Medicine | Admitting: Internal Medicine

## 2013-09-15 DIAGNOSIS — M25551 Pain in right hip: Secondary | ICD-10-CM

## 2013-09-15 DIAGNOSIS — G8929 Other chronic pain: Secondary | ICD-10-CM | POA: Insufficient documentation

## 2013-09-15 DIAGNOSIS — M25559 Pain in unspecified hip: Secondary | ICD-10-CM | POA: Insufficient documentation

## 2013-09-18 ENCOUNTER — Telehealth: Payer: Self-pay | Admitting: Internal Medicine

## 2013-09-18 NOTE — Telephone Encounter (Signed)
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

## 2013-09-19 NOTE — Telephone Encounter (Signed)
Pt requesting MRI results. Please post results

## 2013-09-20 NOTE — Telephone Encounter (Signed)
Pt. Is calling back again in regards to MRI, pt. Was notified PCP is out for the week... Pt. Would like to receive a call from nurse not only for MRI results but also to discuss the medication she was prescribed (Flexeril).....Marland Kitchenplease follow up with patient

## 2013-09-25 NOTE — Telephone Encounter (Signed)
MRI results revealed left trochanteric bursitis, which is iflammation/fluid of the bursa in her hip.  She may have a short course of NSAIDS such as Meloxicam 7.5mg  QD for 10-14 days. If that does not improve the pain then she may have to be referred to Sports Medicine for steroid injections to relieve inflammation.

## 2013-09-26 ENCOUNTER — Telehealth: Payer: Self-pay | Admitting: Emergency Medicine

## 2013-09-26 DIAGNOSIS — M719 Bursopathy, unspecified: Secondary | ICD-10-CM

## 2013-09-26 NOTE — Telephone Encounter (Signed)
Status: Signed            MRI results revealed left trochanteric bursitis, which is iflammation/fluid of the bursa in her hip.  She may have a short course of NSAIDS such as Meloxicam 7.5mg  QD for 10-14 days. If that does not improve the pain then she may have to be referred to Sports Medicine for steroid injections to relieve inflammation.   Pt given  MRI results. Pt states she has been taking Meloxicam for 1 year now without relief. Sports Medicine referral placed

## 2013-09-27 ENCOUNTER — Telehealth: Payer: Self-pay | Admitting: Internal Medicine

## 2013-09-27 NOTE — Telephone Encounter (Signed)
Pt called today to let us know that she hasnt heard from the patient assistance regarding her dexilant and she is out. She is asking for samples. Per JL we are out of Dexilant samples and she should try calling the patient assistance to follow up with them. Pt does not have their number to call. If there is any paper work on this patient regarding this can you give her the number so she can call them. I told her that they probably wouldn't talk with JL but the patient.

## 2013-09-29 ENCOUNTER — Ambulatory Visit
Admission: RE | Admit: 2013-09-29 | Discharge: 2013-09-29 | Disposition: A | Discharge status: Home / Self Care | Payer: No Typology Code available for payment source | Source: Ambulatory Visit | Attending: Sports Medicine | Admitting: Sports Medicine

## 2013-09-29 ENCOUNTER — Encounter: Payer: Self-pay | Admitting: Sports Medicine

## 2013-09-29 ENCOUNTER — Ambulatory Visit (INDEPENDENT_AMBULATORY_CARE_PROVIDER_SITE_OTHER): Payer: No Typology Code available for payment source | Admitting: Sports Medicine

## 2013-09-29 VITALS — BP 108/76 | Ht 63.0 in | Wt 174.0 lb

## 2013-09-29 DIAGNOSIS — M5416 Radiculopathy, lumbar region: Secondary | ICD-10-CM

## 2013-09-29 DIAGNOSIS — M76899 Other specified enthesopathies of unspecified lower limb, excluding foot: Secondary | ICD-10-CM

## 2013-09-29 DIAGNOSIS — M7061 Trochanteric bursitis, right hip: Secondary | ICD-10-CM | POA: Insufficient documentation

## 2013-09-29 DIAGNOSIS — IMO0002 Reserved for concepts with insufficient information to code with codable children: Secondary | ICD-10-CM

## 2013-09-29 MED ORDER — METHYLPREDNISOLONE ACETATE 80 MG/ML IJ SUSP
80.0000 mg | Freq: Once | INTRAMUSCULAR | Status: AC
  Administered 2013-09-29: 80 mg via INTRA_ARTICULAR

## 2013-09-29 NOTE — Assessment & Plan Note (Signed)
Although not present on MRI the patient had evidence of greater trochanter bursitis on the right hip based on exam  Recommendations: -Recommended greater trochanter bursa injection with corticosteroid to relieve inflammation and pain -Provided patient with back and hip stabilization exercises -Followup in 2 weeks to reevaluate and consideration for a possible second injection if needed.

## 2013-09-29 NOTE — Progress Notes (Signed)
Brianna [name] - 53 y.o. female MRN [id number]  Date of birth: [birth year]  SUBJECTIVE:  Including CC & ROS.  Brianna [name] is a 53 year old female who recently presented to PCPs office to establish care and had several chronic conditions that have been untreated for some time. One in particular was her right hip pain has been present for the past 3-4 months. She also complained of chronic low back pain with radicular symptoms. Patient had a x-ray of the right hip in January 2015 was unremarkable. PCP obtained a MRI of the hip that was showed a normal-appearing hip no significant evidence of arthritis with evidence of left greater trochanter or situs but not on the right. Patient was referred to our office today for right hip bursitis.  Right hip: Patient reports that she localizes her pain over her right greater trochanter with point tenderness at this location. This pain radiates down into her lateral knee is not described as a numbness and tingling but more a general soreness. She tried anti-inflammatories with no relief and feels the dissecting her ambulation.  Lumbar spine: Patient also reports a history of chronic low back pain for several years. Reports a history of known disc herniation and arthritis in the past. Had also been treated with ESI several years ago I was hopeful. She has not had any recent lumbar spine x-rays or MRI. She describes low back pain with radicular symptoms down the right leg described as numbness and tingling into the toes. This numbness and tingling sensation or intermittent. She feels it affects her ambulation and activities of daily living.   ROS: Review of systems otherwise negative except for information present in HPI  HISTORY: Past Medical, Surgical, Social, and Family History Reviewed & Updated per EMR. Pertinent Historical Findings include: Patient reported history of lumbar disc herniation and osteoarthritis Depression Hypertension  DATA REVIEWED: Personally  reviewed patient's pelvic MRI 09/2013 revealed a normal right hip with no signs of degenerative changes. There is a small simple left greater trochanter  Bursitis. Right hip x-rays from January 2015 to use her unremarkable for any bony abnormalities.  PHYSICAL EXAM:  VS: BP:108/76 mmHg  HR: bpm  TEMP: ( )  RESP:   HT:5\' 3"  (160 cm)   WT:174 lb (78.926 kg)  BMI:30.9 General: well nourished,  alert and oriented x3, Appropriate affect/ mood Skin of LE: warm; dry, no rashes, lesions, ecchymosis or erythema. Vascular: Dorsal pedal and femoral pulses 2+ bilaterally HIP EXAM:  Right side: Observation - no ecchymosis, edema, or hematoma present over the anterior, lateral, or posterior soft tissue surrounding the hip Palpation:  No anterior hip joint tenderness No tenderness over the pubic synthesis,   Significant severe tenderness of the lateral right greater trochanter and bursa  No PSIS tenderness or SI joint pain ROM: Normal Hip motion in flexion, extension, internal and external rotation bilaterally  Muscle strength: Significant bilateral weakness in hip flexors, abductors and abductors do to deconditioning  LOW BACK EXAM: Observation: Normal curvature and no kyphosis or lordosis, no scoliosis.  Iliac crests are symmetric Palpation:  No step off defects noted in the thoracic or lumbar spine.   Mild muscle spasm and tenderness along the paraspinal musculature of the lumbar spine. Range of motion:  Normal flexion on forward bending, no pain with extension, no pain with one leg hyperextension. Neuromuscular: Positive pain and radiculopathy with straight leg raise right, SLR negative on left, normal gait walking with walking on heels or walking on toes, normal tandem gait.  Nerve root intervention:   L2 and L3: Normal hip flexion with no motion weakness L2, L3, L4: Normal hip abduction bilaterally.  Normal patellar DTR +3 bilaterally L4, L5, S1: Normal hip abduction bilaterally. Decreased  sensation to light touch S1 and S2: Normal ankle plantar-flexion bilaterally.  Normal Achilles tendon DTR +3 bilaterally L5: Normal extensor hallucis longus bilaterally  Injection procedure: Consent obtained and verified. Sterile betadine prep. Furthur cleansed with alcohol. Topical analgesic spray: Ethyl chloride. Injection Indication: Right greater trochanter bursitis Approached in typical fashion with: Completed without difficulty Meds: 1 cc of 80 mg Depo-Medrol and 3 cc of lidocaine Needle:  3 inch spinal needle was used Aftercare instructions and Red flags advised. Advised to call if fevers/chills, erythema, induration, drainage, or persistent bleeding.   ASSESSMENT & PLAN: See problem based charting & AVS for pt instructions.  Greater than 40 minutes was spent face-to-face with the patient discussing treatment plan, providing  Therapeutic intervention, and ranging continued diagnostic care  Patient also mentioned some intermittent shoulder pain which I notified her we would discuss this at her followup appointment.

## 2013-09-29 NOTE — Assessment & Plan Note (Addendum)
Patient describes symptoms of lumbar radiculopathy down the right side with additional exam finds of a positive straight leg raise test and decreased sensation but intact motor/ reflex function  Recommendations: -Given her known history of disc bulge and response to ESI in the past I recommended repeating her MRI since this has been several years to reevaluate the significance of her lumbar spine arthritis and D DD -Will followup with patient after MRI results and based on these results will either refer her for [facility] versus an option to be evaluated by spinal surgery -Patient was agreeable with this plan

## 2013-09-29 NOTE — Patient Instructions (Signed)
It was great to see you today! Call the office if you have any questions [phone]

## 2013-10-02 ENCOUNTER — Ambulatory Visit
Admission: RE | Admit: 2013-10-02 | Discharge: 2013-10-02 | Disposition: A | Discharge status: Home / Self Care | Payer: No Typology Code available for payment source | Source: Ambulatory Visit | Attending: Sports Medicine | Admitting: Sports Medicine

## 2013-10-02 ENCOUNTER — Telehealth: Payer: Self-pay | Admitting: Internal Medicine

## 2013-10-02 DIAGNOSIS — M5416 Radiculopathy, lumbar region: Secondary | ICD-10-CM

## 2013-10-02 NOTE — Telephone Encounter (Signed)
Patient here inquiring about dexilant samples

## 2013-10-02 NOTE — Telephone Encounter (Signed)
Patient assistance forms have been sent to the company and pt was given samples of dexilant.

## 2013-10-02 NOTE — Telephone Encounter (Signed)
Gave #3 boxes of dexilant.

## 2013-10-03 ENCOUNTER — Telehealth: Payer: Self-pay | Admitting: Sports Medicine

## 2013-10-03 DIAGNOSIS — M5416 Radiculopathy, lumbar region: Secondary | ICD-10-CM

## 2013-10-03 NOTE — Telephone Encounter (Signed)
The patient over the phone to discuss the results of her MRI  Notify the patient there is evidence of disc bulge at L4-L5 and L5-S1  Disc herniation at L5-S1 is causing stenosis at the right foramen consistent with her right-sided radicular symptoms.  Provided patient with options of referral to spinal surgery versus referral for ESI. Patient was agreeable with referral to ESI's. Referral was made to interventional radiology

## 2013-10-05 ENCOUNTER — Other Ambulatory Visit: Payer: Self-pay | Admitting: Sports Medicine

## 2013-10-05 DIAGNOSIS — M5440 Lumbago with sciatica, unspecified side: Secondary | ICD-10-CM

## 2013-10-06 ENCOUNTER — Other Ambulatory Visit: Payer: Self-pay | Admitting: *Deleted

## 2013-10-06 DIAGNOSIS — G8929 Other chronic pain: Secondary | ICD-10-CM

## 2013-10-06 DIAGNOSIS — M25551 Pain in right hip: Principal | ICD-10-CM

## 2013-10-06 MED ORDER — TRAMADOL HCL 50 MG PO TABS: 50.0000 mg | ORAL_TABLET | Freq: Four times a day (QID) | ORAL | Status: DC | PRN

## 2013-10-11 ENCOUNTER — Ambulatory Visit
Admission: RE | Admit: 2013-10-11 | Discharge: 2013-10-11 | Disposition: A | Discharge status: Home / Self Care | Payer: No Typology Code available for payment source | Source: Ambulatory Visit | Attending: Sports Medicine | Admitting: Sports Medicine

## 2013-10-11 ENCOUNTER — Other Ambulatory Visit: Payer: Self-pay | Admitting: Sports Medicine

## 2013-10-11 DIAGNOSIS — M5440 Lumbago with sciatica, unspecified side: Secondary | ICD-10-CM

## 2013-10-11 MED ORDER — METHYLPREDNISOLONE ACETATE 40 MG/ML INJ SUSP (RADIOLOG
120.0000 mg | Freq: Once | INTRAMUSCULAR | Status: AC
  Administered 2013-10-11: 120 mg via EPIDURAL

## 2013-10-11 MED ORDER — IOHEXOL 180 MG/ML  SOLN
1.0000 mL | Freq: Once | INTRAMUSCULAR | Status: AC | PRN
  Administered 2013-10-11: 1 mL via EPIDURAL

## 2013-10-11 NOTE — Discharge Instructions (Signed)

## 2013-10-19 ENCOUNTER — Ambulatory Visit (INDEPENDENT_AMBULATORY_CARE_PROVIDER_SITE_OTHER): Payer: Self-pay | Admitting: Sports Medicine

## 2013-10-19 ENCOUNTER — Encounter: Payer: Self-pay | Admitting: Sports Medicine

## 2013-10-19 VITALS — Ht 63.0 in | Wt 174.0 lb

## 2013-10-19 DIAGNOSIS — G5601 Carpal tunnel syndrome, right upper limb: Secondary | ICD-10-CM

## 2013-10-19 DIAGNOSIS — M5416 Radiculopathy, lumbar region: Secondary | ICD-10-CM

## 2013-10-19 DIAGNOSIS — M25511 Pain in right shoulder: Secondary | ICD-10-CM

## 2013-10-19 DIAGNOSIS — M7061 Trochanteric bursitis, right hip: Secondary | ICD-10-CM

## 2013-10-19 DIAGNOSIS — S46091A Other injury of muscle(s) and tendon(s) of the rotator cuff of right shoulder, initial encounter: Secondary | ICD-10-CM

## 2013-10-19 MED ORDER — GABAPENTIN (ONCE-DAILY) 300 MG PO TABS: ORAL_TABLET | ORAL | Status: DC

## 2013-10-19 MED ORDER — TRAMADOL HCL 50 MG PO TABS: 50.0000 mg | ORAL_TABLET | Freq: Four times a day (QID) | ORAL | Status: DC | PRN

## 2013-10-19 NOTE — Assessment & Plan Note (Signed)
Impression: Patient presented today with right hand numbness.   History and physical exam are most consistent with carpal tunnel nerve impingement.   PE did reveal thenar eminence atrophy A positive Phalen's maneuver which is 80% sensitive for carpal tunnel.    PLAN:Recommended continuing night splints. Patient cannot afford EMG testing given that she has no current insurance. We'll attempt to see if the gabapentin we are starting for her neuropathic lumbar pain we'll provide some clinical relief for her median nerve radiculopathy. If she does not find clinical benefit would consider proceeding with carpal tunnel injection.

## 2013-10-19 NOTE — Assessment & Plan Note (Signed)
Patient received some clinical improvement from [facility]. Discussed options of repeating injection to see if she can get further improvement versus medication management. Patient would prefer medication management at this point. We'll start her on gabapentin to help with some of this neuropathic type pain. Also recommended using tramadol when necessary throughout the day for additional pain control.

## 2013-10-19 NOTE — Assessment & Plan Note (Signed)
Clinically improved following injection at last visit. Expect the majority of her lateral hip pain is coming from her lumbar spine

## 2013-10-19 NOTE — Assessment & Plan Note (Addendum)
Is on patient's exam today I cannot clearly classify her shoulder pain as solely from rotator cuff tendinitis versus from her known cervical spine degenerative disc. Exam was consistent with some rotator cuff tendinitis. Offer patient treatment with a subacromial injection which she refused at this time. As the patient up with some physical therapy to work on rotator cuff strengthening and scapular stabilization.  Will followup in 4-6 weeks to see if she has clinical improvement from this intervention

## 2013-10-19 NOTE — Progress Notes (Signed)
Brianna [name] - 53 y.o. female MRN [id number]  Date of birth: 1960/11/06  SUBJECTIVE:  Including CC & ROS.  Brianna [name] is a 53 year old female who recently presented to PCPs office to establish care and had several chronic MSK conditions that have been untreated for some time.   Right hip pain: Patient's last visit we discussed that this is likely multifactorial part of her pain was related to greater trochanter bursitis to to the location of her tenderness on exam and results of her up for an MRI ordered by her PCP. Patient reports she had a clinical response from this greater trochanter injection however the neuropathic type pain she has at the posterior hip and down the back of her right leg is likely continuing to come from her back. Patient continues to deny any pain in her groin. She has no difficulty with ambulation. Most of her pain is with driving and sitting for long periods of time.  Lumbar spine: At her last office visit patient reported history of disc herniation in her lumbar spine with previous history of ESI in the past. We repeated her lumbar xray and MRI to further classify the progression of her degenerative changes. X-ray showed multilevel facet hypertrophy and disc space narrowing. MRI revealed chronic lumbar disc and endplate degenerative changes at L4-L5 L5-S1 resulting in foraminal stenosis affecting the right L5-S1 nerve root. Patient was sent for [facility] with radiology. Reports a fall and this injection she received approximately 50% improvement in her lumbar back pain and right-sided radiculopathy.   Right shoulder and arm: Patient wanted to discuss a new problem today concerning her right shoulder. She's had global tenderness in her shoulder, pain with overhead activities, and pain at night. She also reports a history of carpal tunnel syndrome. She's been wearing wrist splints at night with little to no relief. She's not had any nerve conduction studies in the past. She denies  any history of carpal tunnel injections. Her symptoms of numbness and tingling is particularly in her first second and third digits. She's been on anti-inflammatory with Mobic for many years with little to no relief. She also reports a history of known cervical disc disease as well.  ROS: Review of systems otherwise negative except for information present in HPI  HISTORY: Past Medical, Surgical, Social, and Family History Reviewed & Updated per EMR. Pertinent Historical Findings include: Patient reported history of lumbar disc herniation and osteoarthritis Depression Hypertension  DATA REVIEWED: Personally reviewed patient's pelvic MRI 09/2013 revealed a normal right hip with no signs of degenerative changes. There is a small simple left greater trochanter  Bursitis. Right hip x-rays from January 2015 to use her unremarkable for any bony abnormalities.  PHYSICAL EXAM:  VS: BP:   HR: bpm  TEMP: ( )  RESP:   HT:5\' 3"  (160 cm)   WT:174 lb (78.926 kg)  BMI:30.9 General: well nourished,  alert and oriented x3, Appropriate affect/ mood Skin of LE: warm; dry, no rashes, lesions, ecchymosis or erythema. Vascular: Dorsal pedal and femoral pulses 2+ bilaterally HIP EXAM:  Right side: Observation - no ecchymosis, edema, or hematoma present over the anterior, lateral, or posterior soft tissue surrounding the hip Palpation:  No anterior hip joint tenderness No tenderness over the pubic synthesis,   Resolution tenderness of the lateral right greater trochanter and bursa  No PSIS tenderness or SI joint pain ROM: Normal Hip motion in flexion, extension, internal and external rotation bilaterally  Muscle strength: Significant bilateral weakness in hip  flexors, abductors and abductors do to deconditioning  LOW BACK EXAM: Observation: Normal curvature and no kyphosis or lordosis, no scoliosis.  Iliac crests are symmetric Palpation:  No step off defects noted in the thoracic or lumbar spine.   Mild  muscle spasm and tenderness along the paraspinal musculature of the lumbar spine. Range of motion:  Normal flexion on forward bending, no pain with extension, no pain with one leg hyperextension. Neuromuscular: Positive resolution of pain and radicular symptoms with straight leg raise right, SLR negative on left, normal gait walking with walking on heels or walking on toes, normal tandem gait.  Nerve root intervention:   L2 and L3: Normal hip flexion with no motion weakness L2, L3, L4: Normal hip abduction bilaterally.  Normal patellar DTR +3 bilaterally L4, L5, S1: Normal hip abduction bilaterally. Decreased sensation to light touch S1 and S2: Normal ankle plantar-flexion bilaterally.  Normal Achilles tendon DTR +3 bilaterally L5: Normal extensor hallucis longus bilaterally  Upper extremity EXAM:  General: well nourished Skin of UE: warm; dry, no rashes, lesions, ecchymosis or erythema. Vascular: radial pulses 2+ bilaterally Neurologically: Normal sensation with no sensory or motor defects in C4-C8, bilateral Palpation: no tenderness over the [facility] joint, acromion, no bicipital grove tenderness, no supraspinatus tenderness ROM active/passive: symmetric full 180 degree of abduction and forward flexion Right shoulder pain at end ROM, symmetric internal (80-90) and decreased external rotation 60 deg on right and 90 with left. Appley's scratch test work on the right Strength testing: Right shoulder has 4/5 strength in internal and external rotation, forward flexion, adduction and abduction     Special Test: Positive Neer's, Positive Hawkins, positive empty can, neg O'Brien, neg speed's  Negative Spurling sign bilaterally.  Range of motion: Full range of motion of the wrist and wrist flexion, extension, supination, pronation.   Muscle strength: No weakness or pain with intact flexor digitorum superficialis and profundus.  No pain or weakness with wrist flexion of the flexor carpi ulnaris and  radialis 5/5 strength.  Neurologic: Normal sensation C5-T1, normal deep tendon reflexes at the brachial radialis and triceps.    PE reveal mild thenar eminence atrophy, A positive Phalen's maneuver which is 80% sensitive for carpal tunnel.    ASSESSMENT & PLAN: See problem based charting & AVS for pt instructions.  Greater than 25 minutes was spent face-to-face with the patient discussing treatment plan, providing therapeutic intervention, and arranging continued diagnostic care

## 2013-10-19 NOTE — Patient Instructions (Signed)
Gabapentin (nerve pain medication) -  Start taking 1/2 at night for 5 days then increase to 1 tablet at bedtime for 1 week, then increase to 2 tablets at bedtime  It was great to see you today! Call the office if you have any questions [phone]

## 2013-10-23 ENCOUNTER — Encounter: Payer: Self-pay | Admitting: Internal Medicine

## 2013-10-26 ENCOUNTER — Ambulatory Visit: Payer: No Typology Code available for payment source | Attending: Internal Medicine | Admitting: Internal Medicine

## 2013-10-26 ENCOUNTER — Encounter: Payer: Self-pay | Admitting: Internal Medicine

## 2013-10-26 ENCOUNTER — Other Ambulatory Visit: Payer: Self-pay | Admitting: *Deleted

## 2013-10-26 VITALS — BP 116/84 | HR 82 | Temp 98.6°F | Resp 14 | Ht 63.0 in | Wt 169.0 lb

## 2013-10-26 DIAGNOSIS — F32A Depression, unspecified: Secondary | ICD-10-CM

## 2013-10-26 DIAGNOSIS — G8929 Other chronic pain: Secondary | ICD-10-CM | POA: Insufficient documentation

## 2013-10-26 DIAGNOSIS — K219 Gastro-esophageal reflux disease without esophagitis: Secondary | ICD-10-CM | POA: Insufficient documentation

## 2013-10-26 DIAGNOSIS — M25551 Pain in right hip: Secondary | ICD-10-CM | POA: Insufficient documentation

## 2013-10-26 DIAGNOSIS — I10 Essential (primary) hypertension: Secondary | ICD-10-CM | POA: Insufficient documentation

## 2013-10-26 DIAGNOSIS — M1990 Unspecified osteoarthritis, unspecified site: Secondary | ICD-10-CM | POA: Insufficient documentation

## 2013-10-26 DIAGNOSIS — F329 Major depressive disorder, single episode, unspecified: Secondary | ICD-10-CM | POA: Insufficient documentation

## 2013-10-26 DIAGNOSIS — M545 Low back pain: Secondary | ICD-10-CM | POA: Insufficient documentation

## 2013-10-26 DIAGNOSIS — N819 Female genital prolapse, unspecified: Secondary | ICD-10-CM

## 2013-10-26 DIAGNOSIS — R32 Unspecified urinary incontinence: Secondary | ICD-10-CM | POA: Insufficient documentation

## 2013-10-26 LAB — POCT URINALYSIS DIPSTICK
Blood, UA: NEGATIVE
Glucose, UA: NEGATIVE
Ketones, UA: NEGATIVE
LEUKOCYTES UA: NEGATIVE
NITRITE UA: NEGATIVE
PROTEIN UA: NEGATIVE
Spec Grav, UA: 1.025
Urobilinogen, UA: 0.2
pH, UA: 5.5

## 2013-10-26 MED ORDER — GABAPENTIN (ONCE-DAILY) 300 MG PO TABS: ORAL_TABLET | ORAL | Status: DC

## 2013-10-26 MED ORDER — CITALOPRAM HYDROBROMIDE 40 MG PO TABS: 40.0000 mg | ORAL_TABLET | Freq: Every day | ORAL | Status: DC

## 2013-10-26 MED ORDER — HYDROCHLOROTHIAZIDE 25 MG PO TABS: 25.0000 mg | ORAL_TABLET | Freq: Every day | ORAL | Status: DC

## 2013-10-26 NOTE — Progress Notes (Signed)
Patient ID: Brianna [name], female   DOB: 27-Jun-1960, 53 y.o.   MRN: [id number]  CC: Followup  HPI: Patient presents to clinic today for followup of lower back pain.  Patient reports that she has been seen sports medicines which have given her several steroid injections in her hip and lower back.  Patient reports that she is now beginning to have some urinary incontinence where she is not able to get to the restroom in time before voiding on herself. The lower back pain is aggravated by physical labor and is described as 'sciatic nerve pain'. Patient reports that she has a history of depression and is concerned that her depression may causing her more pain. Patient is currently on Celexa but feels like the Celexa is not working for her as much as before. Patient reports loss of weight do to flares of her diverticulitis.   No Known Allergies Past Medical History  Diagnosis Date  . Depression   . Hypertension   . Anxiety   . GERD (gastroesophageal reflux disease)   . Headache   . Arthritis   . Chronic right hip pain since 09/2012   Current Outpatient Prescriptions on File Prior to Visit  Medication Sig Dispense Refill  . citalopram (CELEXA) 40 MG tablet Take 40 mg by mouth daily.      . cyclobenzaprine (FLEXERIL) 10 MG tablet Take 1 tablet (10 mg total) by mouth 3 (three) times daily as needed for muscle spasms.  30 tablet  2  . dexlansoprazole (DEXILANT) 60 MG capsule Take 60 mg by mouth daily.      . hydrochlorothiazide (HYDRODIURIL) 25 MG tablet Take 25 mg by mouth daily.      . traMADol (ULTRAM) 50 MG tablet Take 1 tablet (50 mg total) by mouth every 6 (six) hours as needed.  90 tablet  0  . polyethylene glycol-electrolytes (TRILYTE) 420 G solution Take 4,000 mLs by mouth as directed.  4000 mL  0   No current facility-administered medications on file prior to visit.   Family History  Problem Relation Age of Onset  . Colon cancer Neg Hx   . Ovarian cancer Mother    History    Social History  . Marital Status: Widowed    Spouse Name: N/A    Number of Children: N/A  . Years of Education: N/A   Occupational History  .      hasn't worked since October 2014. Filing for disability   Social History Main Topics  . Smoking status: Never Smoker   . Smokeless tobacco: Not on file     Comment: Quit in 2009  . Alcohol Use: No  . Drug Use: No  . Sexual Activity: Not on file   Other Topics Concern  . Not on file   Social History Narrative  . No narrative on file    Review of Systems: See history of present illness   Objective:   Filed Vitals:   10/26/13 1425  BP: 116/84  Pulse: 82  Temp: 98.6 F (37 C)  Resp: 14    Physical Exam  Constitutional: She is oriented to person, place, and time.  Cardiovascular: Normal rate, regular rhythm and normal heart sounds.   Pulmonary/Chest: Effort normal and breath sounds normal.  Abdominal: Soft. Bowel sounds are normal. She exhibits no distension.  Genitourinary: Rectal exam shows anal tone normal (normal rectal tone).  Patient has pelvic organ prolapse  Musculoskeletal: Normal range of motion.  Able to walk on tip toes  Neurological: She is alert and oriented to person, place, and time. No cranial nerve deficit. Coordination normal.  Skin: Skin is warm and dry.     Lab Results  Component Value Date   WBC 5.3 07/27/2013   HGB 13.4 07/27/2013   HCT 38.8 07/27/2013   MCV 86.0 07/27/2013   PLT 289 07/27/2013   Lab Results  Component Value Date   CREATININE 0.85 07/27/2013   BUN 21 07/27/2013   NA 143 07/27/2013   K 4.4 07/27/2013   CL 106 07/27/2013   CO2 25 07/27/2013    No results found for this basename: HGBA1C   Lipid Panel  No results found for this basename: chol, trig, hdl, cholhdl, vldl, ldlcalc       Assessment and plan:   Temprence was seen today for follow-up.  Diagnoses and associated orders for this visit:  Essential hypertension - Refill hydrochlorothiazide (HYDRODIURIL) 25 MG  tablet; Take 1 tablet (25 mg total) by mouth daily.  Urinary incontinence, unspecified incontinence type/Prolapse of female pelvic organs - POCT urinalysis dipstick - Ambulatory referral to Urology   Unlikely to be cauda equina due to having symptoms for few weeks and MRI during that time from was negative  Low back pain Continue with sports medicine  Depression - citalopram (CELEXA) 40 MG tablet; Take 1 tablet (40 mg total) by mouth daily. Patient referred to [facility] for psychiatry services. Patient has been on several antidepressants in the past   Return in about 6 months (around 04/27/2014). Patient given strict followup precautions that she wanted immediate return to clinic or local emergency department      Chari Manning, [facility] [phone] 10/26/2013, 2:56 PM

## 2013-10-26 NOTE — Progress Notes (Signed)
Pt is here following up on her pain in her hips, back, arms, arthritis and her GERD. Pt states that she has moments that she does not make it to the bathroom in time.

## 2013-10-27 MED ORDER — GABAPENTIN 100 MG PO CAPS: ORAL_CAPSULE | ORAL | Status: DC

## 2013-10-27 NOTE — Addendum Note (Signed)
Addended by: Louie Casa on: 10/27/2013 05:18 PM   Modules accepted: Orders, Medications

## 2013-10-30 ENCOUNTER — Ambulatory Visit: Payer: No Typology Code available for payment source | Attending: Internal Medicine

## 2013-10-31 ENCOUNTER — Telehealth: Payer: Self-pay | Admitting: Internal Medicine

## 2013-10-31 NOTE — Telephone Encounter (Signed)
Pt called to see if she could get some dexilant samples. Pt last seen in AUG 2015. Per GF patient is able to get samples. Pt said she would stop by before 5pm today to pick them up.

## 2013-10-31 NOTE — Telephone Encounter (Signed)
Samples up front 

## 2013-11-09 ENCOUNTER — Ambulatory Visit: Payer: No Typology Code available for payment source | Admitting: Sports Medicine

## 2013-11-27 ENCOUNTER — Telehealth: Payer: Self-pay | Admitting: Internal Medicine

## 2013-11-27 NOTE — Telephone Encounter (Signed)
We are out of dexilant samples at this time.

## 2013-11-27 NOTE — Telephone Encounter (Signed)
Pt called to make PP FU and is aware of her OV and then asked if she could get some dexilant samples. I told her that I would have to check with the nurse, but she was at lunch at the moment. Please advise. [phone]

## 2013-11-28 ENCOUNTER — Telehealth: Payer: Self-pay | Admitting: Internal Medicine

## 2013-11-28 ENCOUNTER — Emergency Department (HOSPITAL_COMMUNITY)
Admission: EM | Admit: 2013-11-28 | Discharge: 2013-11-28 | Disposition: A | Discharge status: Home / Self Care | Payer: No Typology Code available for payment source | Attending: Emergency Medicine | Admitting: Emergency Medicine

## 2013-11-28 ENCOUNTER — Encounter (HOSPITAL_COMMUNITY): Payer: Self-pay | Admitting: Emergency Medicine

## 2013-11-28 DIAGNOSIS — Z87891 Personal history of nicotine dependence: Secondary | ICD-10-CM | POA: Insufficient documentation

## 2013-11-28 DIAGNOSIS — M1990 Unspecified osteoarthritis, unspecified site: Secondary | ICD-10-CM | POA: Insufficient documentation

## 2013-11-28 DIAGNOSIS — I10 Essential (primary) hypertension: Secondary | ICD-10-CM | POA: Insufficient documentation

## 2013-11-28 DIAGNOSIS — K219 Gastro-esophageal reflux disease without esophagitis: Secondary | ICD-10-CM | POA: Insufficient documentation

## 2013-11-28 DIAGNOSIS — Z791 Long term (current) use of non-steroidal anti-inflammatories (NSAID): Secondary | ICD-10-CM | POA: Insufficient documentation

## 2013-11-28 DIAGNOSIS — F419 Anxiety disorder, unspecified: Secondary | ICD-10-CM | POA: Insufficient documentation

## 2013-11-28 DIAGNOSIS — Z79899 Other long term (current) drug therapy: Secondary | ICD-10-CM | POA: Insufficient documentation

## 2013-11-28 DIAGNOSIS — T7840XA Allergy, unspecified, initial encounter: Secondary | ICD-10-CM | POA: Insufficient documentation

## 2013-11-28 DIAGNOSIS — F329 Major depressive disorder, single episode, unspecified: Secondary | ICD-10-CM | POA: Insufficient documentation

## 2013-11-28 DIAGNOSIS — G8929 Other chronic pain: Secondary | ICD-10-CM | POA: Insufficient documentation

## 2013-11-28 MED ORDER — PREDNISONE 10 MG PO TABS: ORAL_TABLET | ORAL | Status: DC

## 2013-11-28 MED ORDER — FAMOTIDINE 20 MG PO TABS
20.0000 mg | ORAL_TABLET | Freq: Once | ORAL | Status: AC
  Administered 2013-11-28: 20 mg via ORAL
  Filled 2013-11-28: qty 1

## 2013-11-28 MED ORDER — PREDNISONE 10 MG PO TABS
60.0000 mg | ORAL_TABLET | Freq: Once | ORAL | Status: AC
  Administered 2013-11-28: 60 mg via ORAL
  Filled 2013-11-28 (×2): qty 1

## 2013-11-28 MED ORDER — FAMOTIDINE 20 MG PO TABS: 20.0000 mg | ORAL_TABLET | Freq: Two times a day (BID) | ORAL | Status: DC

## 2013-11-28 MED ORDER — HYDROXYZINE HCL 25 MG PO TABS: 25.0000 mg | ORAL_TABLET | Freq: Four times a day (QID) | ORAL | Status: DC

## 2013-11-28 MED ORDER — HYDROXYZINE HCL 25 MG PO TABS
25.0000 mg | ORAL_TABLET | Freq: Once | ORAL | Status: AC
  Administered 2013-11-28: 25 mg via ORAL
  Filled 2013-11-28: qty 1

## 2013-11-28 NOTE — Telephone Encounter (Signed)
Gave three boxes of samples

## 2013-11-28 NOTE — ED Provider Notes (Signed)
CSN: [id number]     Arrival date & time 11/28/13  1437 History   First MD Initiated Contact with Patient 11/28/13 1457     Chief Complaint  Patient presents with  . Rash     (Consider location/radiation/quality/duration/timing/severity/associated sxs/prior Treatment) HPI   Brianna [name] is a 53 y.o. female who presents to the Emergency Department complaining of rash to most of her body for one month.  She noticed the rash on her arms initially, but has now spread to her face, neck, hands and legs.  She has tried OTC medications without relief.  She states that she began noticing the rash after starting on gabapentin and tramadol.  She denies fever, chills, other family medications with similar symptoms or joint pains.  She also denies shortness of breath or swelling   Past Medical History  Diagnosis Date  . Depression   . Hypertension   . Anxiety   . GERD (gastroesophageal reflux disease)   . Headache   . Arthritis   . Chronic right hip pain since 09/2012   Past Surgical History  Procedure Laterality Date  . Cesarean section    . Oophorectomy    . Colonoscopy N/A 08/22/2013    Procedure: COLONOSCOPY;  Surgeon: Daneil Dolin, MD;  Location: [facility];  Service: Endoscopy;  Laterality: N/A;  1245  . Esophagogastroduodenoscopy N/A 08/22/2013    Procedure: ESOPHAGOGASTRODUODENOSCOPY (EGD);  Surgeon: Daneil Dolin, MD;  Location: [facility];  Service: Endoscopy;  Laterality: N/A;   Family History  Problem Relation Age of Onset  . Colon cancer Neg Hx   . Ovarian cancer Mother    History  Substance Use Topics  . Smoking status: Former Smoker -- 0.50 packs/day for 2 years    Types: Cigarettes    Quit date: 01/06/2007  . Smokeless tobacco: Never Used  . Alcohol Use: No   OB History    Gravida Para Term Preterm AB TAB SAB Ectopic Multiple Living   4 4 4       4      Review of Systems  Constitutional: Negative for fever, chills, activity change and appetite  change.  HENT: Negative for facial swelling, sore throat and trouble swallowing.   Respiratory: Negative for chest tightness, shortness of breath and wheezing.   Musculoskeletal: Negative for neck pain and neck stiffness.  Skin: Positive for rash. Negative for wound.  Neurological: Negative for dizziness, weakness, numbness and headaches.  All other systems reviewed and are negative.     Allergies  Review of patient's allergies indicates no known allergies.  Home Medications   Prior to Admission medications   Medication Sig Start Date End Date Taking? Authorizing Provider  citalopram (CELEXA) 40 MG tablet Take 1 tablet (40 mg total) by mouth daily. 10/26/13  Yes Lance Bosch, NP  dexlansoprazole (DEXILANT) 60 MG capsule Take 60 mg by mouth daily.   Yes Historical Provider, MD  gabapentin (NEURONTIN) 100 MG capsule Start 2 capsule at bedtime and gradually increase to 3 capsule after 1 week. Patient taking differently: Take 100 mg by mouth at bedtime. *may take 3 capsules at bedtime 10/27/13  Yes Deanna M Didiano, DO  hydrochlorothiazide (HYDRODIURIL) 25 MG tablet Take 1 tablet (25 mg total) by mouth daily. 10/26/13  Yes Lance Bosch, NP  meloxicam (MOBIC) 7.5 MG tablet Take 7.5 mg by mouth daily.   Yes Historical Provider, MD  traMADol (ULTRAM) 50 MG tablet Take 1 tablet (50 mg total) by mouth  every 6 (six) hours as needed. 10/19/13  Yes Deanna M Didiano, DO  cyclobenzaprine (FLEXERIL) 10 MG tablet Take 1 tablet (10 mg total) by mouth 3 (three) times daily as needed for muscle spasms. Patient not taking: Reported on 11/28/2013 09/06/13   Lance Bosch, NP  polyethylene glycol-electrolytes (TRILYTE) 420 G solution Take 4,000 mLs by mouth as directed. Patient not taking: Reported on 11/28/2013 08/11/13   Daneil Dolin, MD   BP 113/76 mmHg  Pulse 75  Temp(Src) 98.2 F (36.8 C) (Oral)  Resp 16  Ht 5\' 3"  (1.6 m)  Wt 165 lb (74.844 kg)  BMI 29.24 kg/m2  SpO2 98% Physical Exam   Constitutional: She is oriented to person, place, and time. She appears well-developed and well-nourished. No distress.  HENT:  Head: Normocephalic and atraumatic.  Mouth/Throat: Oropharynx is clear and moist.  Neck: Normal range of motion. Neck supple.  Cardiovascular: Normal rate, regular rhythm, normal heart sounds and intact distal pulses.   No murmur heard. Pulmonary/Chest: Effort normal and breath sounds normal. No respiratory distress. She has no wheezes.  Musculoskeletal: She exhibits no edema or tenderness.  Lymphadenopathy:    She has no cervical adenopathy.  Neurological: She is alert and oriented to person, place, and time. She exhibits normal muscle tone. Coordination normal.  Skin: Skin is warm. Rash noted. There is erythema.  Scattered erythematous papules and occasional welts to most of the body,  Palms and feet are spared.  Moderate excoriation present.  No edema.  Nursing note and vitals reviewed.   ED Course  Procedures (including critical care time) Labs Review Labs Reviewed - No data to display  Imaging Review No results found.   EKG Interpretation None      MDM   Final diagnoses:  Allergic reaction, initial encounter    Patient is well appearing.  No edema.  VSS.  No airway compromise.  pruritic rash that began after starting gabapentin and tramadol.  I have advised her to d/c the medication  and follow-up with her PMD.  Appears stable for d/c and agrees to plan.  rx for vistaril, prednisone and pepcid    Merek Niu L. Vanessa Pala, PA-C 11/30/13 2141  Nat Christen, MD 12/02/13 1055

## 2013-11-28 NOTE — Discharge Instructions (Signed)
Rash A rash is a change in the color or feel of your skin. There are many different types of rashes. You may have other problems along with your rash. HOME CARE  Avoid the thing that caused your rash.  Do not scratch your rash.  You may take cools baths to help stop itching.  Only take medicines as told by your doctor.  Keep all doctor visits as told. GET HELP RIGHT AWAY IF:   Your pain, puffiness (swelling), or redness gets worse.  You have a fever.  You have new or severe problems.  You have body aches, watery poop (diarrhea), or you throw up (vomit).  Your rash is not better after 3 days. MAKE SURE YOU:   Understand these instructions.  Will watch your condition.  Will get help right away if you are not doing well or get worse. Document Released: 06/10/2007 Document Revised: 03/16/2011 Document Reviewed: 10/06/2010 Exit[employer] Patient Information 2015 [employer]. This information is not intended to replace advice given to you by your health care provider. Make sure you discuss any questions you have with your health care provider.  

## 2013-11-28 NOTE — Telephone Encounter (Signed)
PATIENT CAME IN OFFICE INQUIRING ABOUT DEXILANT SAMPLES

## 2013-11-28 NOTE — Telephone Encounter (Signed)
Noted  

## 2013-11-28 NOTE — ED Notes (Addendum)
Patient c/o rash on hands, arms, neck, and legs x1 month. Patient reports treating granddaughter for lice 2-3 weeks ago. Patient now states that she has sores in her head. Per patient started tramadol and gabapentin 2 months ago.

## 2014-01-03 ENCOUNTER — Telehealth: Payer: Self-pay

## 2014-01-03 NOTE — Telephone Encounter (Signed)
Noted  

## 2014-01-03 NOTE — Telephone Encounter (Signed)
Pt called requesting dexilant samples. Gave #2 boxes. Pt has ov next week. She has filled out patient assistance forms for dexilant but did not turn in any income verification. Had Manuela Schwartz ask the patient to bring in any kind of income verification that she has.

## 2014-01-05 ENCOUNTER — Other Ambulatory Visit: Payer: Self-pay | Admitting: Sports Medicine

## 2014-01-09 ENCOUNTER — Ambulatory Visit (INDEPENDENT_AMBULATORY_CARE_PROVIDER_SITE_OTHER): Payer: No Typology Code available for payment source | Admitting: Urology

## 2014-01-09 DIAGNOSIS — N3281 Overactive bladder: Secondary | ICD-10-CM

## 2014-01-09 DIAGNOSIS — N3941 Urge incontinence: Secondary | ICD-10-CM

## 2014-01-12 ENCOUNTER — Ambulatory Visit: Payer: No Typology Code available for payment source | Admitting: Gastroenterology

## 2014-02-01 ENCOUNTER — Telehealth: Payer: Self-pay | Admitting: Internal Medicine

## 2014-02-01 ENCOUNTER — Encounter: Payer: Self-pay | Admitting: Nurse Practitioner

## 2014-02-01 ENCOUNTER — Ambulatory Visit: Payer: No Typology Code available for payment source | Admitting: Nurse Practitioner

## 2014-02-01 NOTE — Telephone Encounter (Signed)
Noted  

## 2014-02-01 NOTE — Telephone Encounter (Signed)
PATIENT WAS A NO SHOW 02/01/14 AND LETTER WAS SENT

## 2014-02-08 ENCOUNTER — Encounter: Payer: Self-pay | Admitting: Sports Medicine

## 2014-02-08 ENCOUNTER — Ambulatory Visit (INDEPENDENT_AMBULATORY_CARE_PROVIDER_SITE_OTHER): Payer: Self-pay | Admitting: Sports Medicine

## 2014-02-08 VITALS — BP 101/72 | Ht 63.0 in | Wt 174.0 lb

## 2014-02-08 DIAGNOSIS — M5116 Intervertebral disc disorders with radiculopathy, lumbar region: Secondary | ICD-10-CM

## 2014-02-08 DIAGNOSIS — S46091A Other injury of muscle(s) and tendon(s) of the rotator cuff of right shoulder, initial encounter: Secondary | ICD-10-CM

## 2014-02-08 MED ORDER — TRAMADOL HCL 50 MG PO TABS: ORAL_TABLET | ORAL | Status: DC

## 2014-02-08 MED ORDER — METHYLPREDNISOLONE ACETATE 40 MG/ML IJ SUSP
40.0000 mg | Freq: Once | INTRAMUSCULAR | Status: AC
  Administered 2014-02-08: 40 mg via INTRA_ARTICULAR

## 2014-02-08 MED ORDER — GABAPENTIN 300 MG PO CAPS: 300.0000 mg | ORAL_CAPSULE | Freq: Every day | ORAL | Status: DC

## 2014-02-08 NOTE — Assessment & Plan Note (Signed)
Discussed in length with patient that the degenerative changes in her back could potentially be controlled from a pain perspective as well as calming down the radicular symptoms with a repeat injection however given the severity of her MRI she could consider consultation with surgery. Patient like to hold off on either option at this time. Advised her to call office if she would like to repeat injection [facility].  We'll titrate up patient's gabapentin to 300 mg at bedtime and refilled her tramadol.

## 2014-02-08 NOTE — Progress Notes (Signed)
Brianna [name] - 54 y.o. female MRN [id number]  Date of birth: 1960-04-19  SUBJECTIVE:  Including CC & ROS.  Brianna [name] is a 54 year old female who recently presented for f/u on several chronic MSK conditions that have been untreated for some time.   Lumbar spine: Patient has a long-standing history of lumbar back pain. With x-ray and MRI evidence of disc and vertebral degenerative changes as well as disc dissection and facet hypertrophy at the L5-S1 level. Patient was sent for [facility] injection done by radiology which the patient reported receiving up to 50% improvement in her back pain for approximately 3 months. Patient would not like to proceed with a repeat injection at this time given that she had difficulty receiving the shot initially. I suspect the majority of patients back pain has cause the radiation of pain to her hip which is all related to degenerative changes within her spine. Her back pain has affected patient's activities of daily living including her ability to work.  Right shoulder and arm: Patient previously complained of right shoulder pain for about 6 months now with global tenderness. Difficulty with overhead activities such as brushing her hair. Pain at night when rolling on the right side. Previously suspected be rotator cuff tendinitis. At that time the patient preferred conservative management with anti-inflammatories which has not provided her much relief.  ROS: Review of systems otherwise negative except for information present in HPI  HISTORY: Past Medical, Surgical, Social, and Family History Reviewed & Updated per EMR. Pertinent Historical Findings include: Patient reported history of lumbar disc herniation and osteoarthritis Depression Hypertension  DATA REVIEWED: Personally reviewed patient's lumbar MRI which revealed disc bulging at L4-L5 with disc herniation L5-S1 causing stenosis the right foramen  PHYSICAL EXAM:  VS: BP:101/72 mmHg  HR: bpm  TEMP: ( )  RESP:    HT:5\' 3"  (160 cm)   WT:174 lb (78.926 kg)  BMI:30.9 General: well nourished,  alert and oriented x3, Appropriate affect/ mood Skin of LE: warm; dry, no rashes, lesions, ecchymosis or erythema. Vascular: Dorsal pedal and femoral pulses 2+ bilaterally  LOW BACK EXAM: Observation: Normal curvature and no kyphosis or lordosis, no scoliosis.  Iliac crests are symmetric Palpation:  No step off defects noted in the thoracic or lumbar spine.   Mild muscle spasm and tenderness along the paraspinal musculature of the lumbar spine. Range of motion:  Normal flexion on forward bending, no pain with extension, no pain with one leg hyperextension. Neuromuscular: Positive resolution of pain and radicular symptoms with straight leg raise right, SLR negative on left, normal gait walking with walking on heels or walking on toes, normal tandem gait.  Nerve root intervention:   L2 and L3: Normal hip flexion with no motion weakness L2, L3, L4: Normal hip abduction bilaterally.  Normal patellar DTR +3 bilaterally L4, L5, S1: Normal hip abduction bilaterally. Decreased sensation to light touch S1 and S2: Normal ankle plantar-flexion bilaterally.  Normal Achilles tendon DTR +3 bilaterally L5: Normal extensor hallucis longus bilaterally  Right Upper extremity EXAM:  General: well nourished Skin of UE: warm; dry, no rashes, lesions, ecchymosis or erythema. Vascular: radial pulses 2+ bilaterally Neurologically: Normal sensation with no sensory or motor defects in C4-C8, bilateral Palpation: no tenderness over the [facility] joint, acromion, no bicipital grove tenderness, no supraspinatus tenderness ROM active/passive: symmetric full 180 degree of abduction and forward flexion Right shoulder pain at end ROM, symmetric internal (80-90) and decreased external rotation 60 deg on right and 90 with left.  Appley's scratch test work on the right Strength testing: Right shoulder has 4/5 strength in internal and external  rotation, forward flexion, adduction and abduction     Special Test: Positive Neer's, Positive Hawkins, positive empty can, neg O'Brien, neg speed's  Negative Spurling sign bilaterally.   ASSESSMENT & PLAN: See problem based charting & AVS for pt instructions. Injection procedure: Consent obtained and verified. Sterile betadine prep. Furthur cleansed with alcohol and betadine. Topical analgesic spray: Ethyl chloride. Injection Indication: Rotator cuff tendonitis of the right shoulder Approached in typical fashion with:  Posterior lateral subacromial injection Meds:  1 mL of 40 mg Depo-Medrol and 3 mL of lidocaine Needle:  1-1/2 inch 25-gauge Completed without difficulty Aftercare instructions and Red flags advised. Advised to call if fevers/chills, erythema, induration, drainage, or persistent bleeding.   Greater than 25 minutes was spent face-to-face with the patient discussing treatment plan, providing therapeutic intervention, and arranging continued diagnostic care

## 2014-02-08 NOTE — Assessment & Plan Note (Signed)
Patient has persistent rotator cuff tendinitis symptoms. She is failed conservative management with anti-inflammatories and home exercise. She would be interested in proceeding with a more aggressive therapy today. Recommended a subacromial injection followed by home exercises. Patient was provided with instructions on the some exercises. Patient tolerated the injection well and will follow-up for this issue as needed.

## 2014-04-10 ENCOUNTER — Ambulatory Visit: Payer: No Typology Code available for payment source | Admitting: Urology

## 2014-04-19 ENCOUNTER — Telehealth: Payer: Self-pay | Admitting: Internal Medicine

## 2014-04-19 NOTE — Telephone Encounter (Signed)
Patient has called in today to get a medication refill for two medications; please f/u with patient about these two requests

## 2014-04-20 ENCOUNTER — Ambulatory Visit: Payer: Medicaid Other | Attending: Internal Medicine | Admitting: Internal Medicine

## 2014-04-20 ENCOUNTER — Encounter: Payer: Self-pay | Admitting: Internal Medicine

## 2014-04-20 VITALS — BP 115/77 | HR 83 | Temp 98.0°F | Resp 16 | Ht 63.0 in | Wt 163.0 lb

## 2014-04-20 DIAGNOSIS — G8929 Other chronic pain: Secondary | ICD-10-CM | POA: Diagnosis not present

## 2014-04-20 DIAGNOSIS — F329 Major depressive disorder, single episode, unspecified: Secondary | ICD-10-CM | POA: Insufficient documentation

## 2014-04-20 DIAGNOSIS — Z79899 Other long term (current) drug therapy: Secondary | ICD-10-CM | POA: Insufficient documentation

## 2014-04-20 DIAGNOSIS — I10 Essential (primary) hypertension: Secondary | ICD-10-CM | POA: Insufficient documentation

## 2014-04-20 DIAGNOSIS — F32A Depression, unspecified: Secondary | ICD-10-CM

## 2014-04-20 MED ORDER — HYDROCHLOROTHIAZIDE 25 MG PO TABS: 25.0000 mg | ORAL_TABLET | Freq: Every day | ORAL | Status: DC

## 2014-04-20 MED ORDER — ACETAMINOPHEN-CODEINE #3 300-30 MG PO TABS: 1.0000 | ORAL_TABLET | Freq: Three times a day (TID) | ORAL | Status: DC | PRN

## 2014-04-20 MED ORDER — CITALOPRAM HYDROBROMIDE 40 MG PO TABS: 40.0000 mg | ORAL_TABLET | Freq: Every day | ORAL | Status: DC

## 2014-04-20 NOTE — Progress Notes (Signed)
Pt is here following up on her HTN and her chronic widespread pain.

## 2014-04-20 NOTE — Progress Notes (Signed)
Patient ID: Brianna [name], female   DOB: 1960-08-26, 54 y.o.   MRN: [id number] Subjective:  Brianna [name] is a 54 y.o. female with hypertension.  Patient reports that she has been to sports medicine for chronic pain. She states that she has had several injections but has now been told that she needs to go to a back Psychologist, sport and exercise. Has not had BP medication or Celexa in one week.  She has filed for disability for her chronic pain. She states that she is miserable because she is always in pain and her house is falling apart and she does not have any money for repairs. She states that she was evaluated by other providers and was told that she had a hital hernia and diverticulitis. Still reporting right heel pain in the morning.   Current Outpatient Prescriptions  Medication Sig Dispense Refill  . citalopram (CELEXA) 40 MG tablet Take 1 tablet (40 mg total) by mouth daily. 30 tablet 3  . dexlansoprazole (DEXILANT) 60 MG capsule Take 60 mg by mouth daily.    Marland Kitchen gabapentin (NEURONTIN) 300 MG capsule Take 1 capsule (300 mg total) by mouth at bedtime. 30 capsule 2  . hydrochlorothiazide (HYDRODIURIL) 25 MG tablet Take 1 tablet (25 mg total) by mouth daily. 30 tablet 3  . traMADol (ULTRAM) 50 MG tablet TAKE 2 TABS Q8HRS PRN PAIN 180 tablet 2  . cyclobenzaprine (FLEXERIL) 10 MG tablet Take 1 tablet (10 mg total) by mouth 3 (three) times daily as needed for muscle spasms. (Patient not taking: Reported on 11/28/2013) 30 tablet 2  . famotidine (PEPCID) 20 MG tablet Take 1 tablet (20 mg total) by mouth 2 (two) times daily. (Patient not taking: Reported on 04/20/2014) 30 tablet 0  . hydrOXYzine (ATARAX/VISTARIL) 25 MG tablet Take 1 tablet (25 mg total) by mouth every 6 (six) hours. As needed for itching (Patient not taking: Reported on 04/20/2014) 20 tablet 0  . polyethylene glycol-electrolytes (TRILYTE) 420 G solution Take 4,000 mLs by mouth as directed. (Patient not taking: Reported on 11/28/2013) 4000 mL 0   No  current facility-administered medications for this visit.    Hypertension ROS: taking medications as instructed, no medication side effects noted, no TIA's, no chest pain on exertion, no dyspnea on exertion, no swelling of ankles and no palpitations.    Objective:  BP 115/77 mmHg  Pulse 83  Temp(Src) 98 F (36.7 C) (Oral)  Resp 16  Ht 5\' 3"  (1.6 m)  Wt 163 lb (73.936 kg)  BMI 28.88 kg/m2  SpO2 99%  Appearance alert, well appearing, and in no distress and oriented to person, place, and time. General exam BP noted to be well controlled today in office, S1, S2 normal, no gallop, no murmur, chest clear, no JVD, no HSM, no edema, CVS exam  - normal rate, regular rhythm, normal S1, S2, no murmurs, rubs, clicks or gallops, normal bilateral carotid upstroke without bruits, no JVD.  Lab review: orders written for new lab studies as appropriate; see orders.     [name] was seen today for follow-up.  Diagnoses and all orders for this visit:  Essential hypertension Orders: -     hydrochlorothiazide (HYDRODIURIL) 25 MG tablet; Take 1 tablet (25 mg total) by mouth daily. Hypertension well controlled.  Current treatment plan is effective, no change in therapy.   Depression Orders: -     Refilled citalopram (CELEXA) 40 MG tablet; Take 1 tablet (40 mg total) by mouth daily. I have explained to patient that  she should speak to her therapist and see if she they will manage her depression medication and to determine if medication changes are appropriate at this time   Chronic pain Orders: -     acetaminophen-codeine (TYLENOL #3) 300-30 MG per tablet; Take 1 tablet by mouth every 8 (eight) hours as needed for moderate pain. I have explained to patient that some of her pain may be due to depression. May help to switch to cymbalta that may help with depression and pain.   Return in about 6 months (around 10/20/2014).  Chari Manning, NP 04/22/2014 10:23 AM

## 2014-04-23 ENCOUNTER — Telehealth: Payer: Self-pay | Admitting: *Deleted

## 2014-04-23 NOTE — Telephone Encounter (Signed)
Patient left message that Ms. Feliciana Rossetti had given her Tylenol #3 on Friday for her chronic pain and that she has had a horrible headache all weekend that she believe is being caused by the medication.  She is asking for Ms. Feliciana Rossetti to give her something else for pain other than the Tylenol 3.  I see in PCP note for 04/20/14 visit that she mentioned Cymbalta as a possibility to treat patient's pain.  Will consult provider and call patient as soon as orders given.

## 2014-04-23 NOTE — Telephone Encounter (Signed)
Patient may have tylenol for pain.

## 2014-04-23 NOTE — Telephone Encounter (Signed)
Left HIPPA compliant medication on patient's voicemail.  If patient calls back please tell her she may take Tylenol for pain-per Chari Manning

## 2014-04-25 ENCOUNTER — Other Ambulatory Visit: Payer: Self-pay | Admitting: Internal Medicine

## 2014-05-03 ENCOUNTER — Telehealth: Payer: Self-pay

## 2014-05-03 MED ORDER — DEXLANSOPRAZOLE 60 MG PO CPDR: 60.0000 mg | DELAYED_RELEASE_CAPSULE | Freq: Every day | ORAL | Status: DC

## 2014-05-03 NOTE — Telephone Encounter (Signed)
Printed prescription  

## 2014-05-03 NOTE — Telephone Encounter (Signed)
rx faxed to the pt assistance company

## 2014-05-03 NOTE — Telephone Encounter (Signed)
Pt is getting patient assistance for dexilant. The patient assistance company needs a new rx printed and faxed to them to continue her refills.

## 2014-05-07 ENCOUNTER — Ambulatory Visit (INDEPENDENT_AMBULATORY_CARE_PROVIDER_SITE_OTHER): Payer: Self-pay | Admitting: Nurse Practitioner

## 2014-05-07 ENCOUNTER — Encounter: Payer: Self-pay | Admitting: Nurse Practitioner

## 2014-05-07 VITALS — BP 119/80 | HR 84 | Temp 97.6°F | Ht 63.0 in | Wt 159.2 lb

## 2014-05-07 DIAGNOSIS — R109 Unspecified abdominal pain: Secondary | ICD-10-CM | POA: Insufficient documentation

## 2014-05-07 DIAGNOSIS — K5900 Constipation, unspecified: Secondary | ICD-10-CM

## 2014-05-07 DIAGNOSIS — R1084 Generalized abdominal pain: Secondary | ICD-10-CM

## 2014-05-07 MED ORDER — LUBIPROSTONE 24 MCG PO CAPS
24.0000 ug | ORAL_CAPSULE | Freq: Two times a day (BID) | ORAL | Status: DC
Start: 2014-05-07 — End: 2015-01-09

## 2014-05-07 NOTE — Assessment & Plan Note (Signed)
Patient with a history of constipation, has been on Linzess but states she is unable to take this regularly and can only take it at night when she doesn't have anywhere to be the next morning due to adverse effects of diarrhea and fecal urgency. Likely decreased effectiveness due to intermittent use related to adverse effects. Will have her stop Linzess and trial Amitiza 24 mcg bid on a full stomach for improvement. Will provide samples to trial. Follow-up in 3 months.

## 2014-05-07 NOTE — Patient Instructions (Addendum)
1.  Stop taking Linzess. 2.  start taking Amitiza 24 g twice a day.  3.  we will provide yu with samples of Amitza for 2 weeks. Please call and let us know how it is working. 4. Return for follow-up in 3 months.

## 2014-05-07 NOTE — Assessment & Plan Note (Signed)
Patient describes crampy lower abdominal pain with increased constipation and inability to take Linzess regularly (see constipation A&P for further details.) Recent colonoscopy 08/22/13 with colonic diverticulosis and anorectal irritatiin likely source of paper hematochezia. Anticipate abdominal pain will improve with improvement in constipation. Will seitch patient from [place] to Amitiza 24 mcg bid to fewer adverse effects and increased compliance. Return for follow-up in 3 months.

## 2014-05-07 NOTE — Progress Notes (Signed)
Referring Provider: Lance Bosch, NP Primary Care Physician:  Chari Manning, NP Primary GI: Dr. Gala Romney  Chief Complaint  Patient presents with  . Follow-up    HPI:   54 year old female here for follow-up visit. Last seen in our office 08/11/2013 for epigastric pain and constipation. Started on IBGuard, Dexilant 60 mg daily, and continue Linzess. She was also set up for a colonoscopy and endoscopy for further evaluation. On endoscopy note patient was noted to have improved dramatically with 1 week of Dexilant. Findings found patent/noncritical Schatzki's ring, 3 quadrant distal esophageal erosions within 4 mm the GE junction, no tumor, no Barrett's, 3 cm hiatal hernia, normal gastric mucosa. Recommended continue Dexilant daily. Colonoscopy performed the same day found clonic diverticulosis, paper hematochezia likely from benign anorectal irritation. Recommended to begin Linzess 145 as previously recommended, add Benefiber twice a day, repeat office visit in 3 months. Patient had been receiving samples of Dexilant till patient could complete colon assistance paperwork. She was a no-show for office visit on 02/01/2014. Pharmaceutical patient assistance was granted 05/03/2014 and a new prescription was faxed to the company.  Today she states she's better in some ways and not better in others. Symptoms that have improved on Dexilant include esophageal burning and reflux. However, she does continue having abdominal pain described as crampy. Has tried to take OTC fiber but has filed for disability. She is unemployed currently due to her disability and is due for administrative hearing. Is being helped by family, friends, and church. Is having continued constipation. She is taking Linzess at night if she's not feeling well, but cannot take during the day due to fecal urgency and diarrhea. Her bowel movements are typically a 1-2 on the [facility] scale. She only takes it on some evening due to having to be  somewhere early in the morning. Denies hematochezia, melena, fever, chills, chest pain, dyspnea. Denies any other upper or lower GI symptoms.  Past Medical History  Diagnosis Date  . Depression   . Hypertension   . Anxiety   . GERD (gastroesophageal reflux disease)   . Headache   . Arthritis   . Chronic right hip pain since 09/2012    Past Surgical History  Procedure Laterality Date  . Cesarean section    . Oophorectomy    . Colonoscopy N/A 08/22/2013    IOM:BTDHRCB diverticulosis paper hemtochezia from anorectal irritation  . Esophagogastroduodenoscopy N/A 08/22/2013    ULA:GTXMIWO reflx esophagitis/noncritical schatzki's ring and hiatial hernia    Current Outpatient Prescriptions  Medication Sig Dispense Refill  . acetaminophen-codeine (TYLENOL #3) 300-30 MG per tablet Take 1 tablet by mouth every 8 (eight) hours as needed for moderate pain. 30 tablet 0  . citalopram (CELEXA) 40 MG tablet Take 1 tablet (40 mg total) by mouth daily. 30 tablet 3  . cyclobenzaprine (FLEXERIL) 10 MG tablet Take 1 tablet (10 mg total) by mouth 3 (three) times daily as needed for muscle spasms. 30 tablet 2  . dexlansoprazole (DEXILANT) 60 MG capsule Take 1 capsule (60 mg total) by mouth daily. 30 capsule 11  . gabapentin (NEURONTIN) 300 MG capsule Take 1 capsule (300 mg total) by mouth at bedtime. 30 capsule 2  . hydrochlorothiazide (HYDRODIURIL) 25 MG tablet Take 1 tablet (25 mg total) by mouth daily. 30 tablet 3  . hydrOXYzine (ATARAX/VISTARIL) 25 MG tablet Take 1 tablet (25 mg total) by mouth every 6 (six) hours. As needed for itching 20 tablet 0  . Linaclotide (LINZESS) 145 MCG  CAPS capsule Take 145 mcg by mouth daily.    . traMADol (ULTRAM) 50 MG tablet TAKE 2 TABS Q8HRS PRN PAIN 180 tablet 2  . famotidine (PEPCID) 20 MG tablet Take 1 tablet (20 mg total) by mouth 2 (two) times daily. (Patient not taking: Reported on 04/20/2014) 30 tablet 0  . polyethylene glycol-electrolytes (TRILYTE) 420 G solution  Take 4,000 mLs by mouth as directed. (Patient not taking: Reported on 11/28/2013) 4000 mL 0   No current facility-administered medications for this visit.    Allergies as of 05/07/2014  . (No Known Allergies)    Family History  Problem Relation Age of Onset  . Colon cancer Neg Hx   . Ovarian cancer Mother     History   Social History  . Marital Status: Widowed    Spouse Name: N/A  . Number of Children: N/A  . Years of Education: N/A   Occupational History  .      hasn't worked since October 2014. Filing for disability   Social History Main Topics  . Smoking status: Former Smoker -- 0.50 packs/day for 2 years    Types: Cigarettes    Quit date: 01/06/2007  . Smokeless tobacco: Never Used  . Alcohol Use: No  . Drug Use: No  . Sexual Activity: Not on file   Other Topics Concern  . None   Social History Narrative    Review of Systems: General: Negative for anorexia, weight loss, fever, chills, fatigue, weakness. Eyes: Negative for vision changes.  ENT: Negative for hoarseness, difficulty swallowing. CV: Negative for chest pain, angina, palpitations, peripheral edema.  Respiratory: Negative for dyspnea at rest, cough, sputum, wheezing.  GI: See history of present illness. MS: Negative for joint pain, low back pain.  Derm: Negative for rash or itching.  Neuro: Negative for weakness, seizure, memory loss, confusion.  Psych: Negative for anxiety, depression.  Endo: Negative for unusual weight change.  Heme: Negative for bruising or bleeding. Allergy: Negative for rash or hives.   Physical Exam: BP 119/80 mmHg  Pulse 84  Temp(Src) 97.6 F (36.4 C) (Oral)  Ht 5\' 3"  (1.6 m)  Wt 159 lb 3.2 oz (72.213 kg)  BMI 28.21 kg/m2 General:   Alert and oriented. No distress noted. Pleasant and cooperative.  Head:  Normocephalic and atraumatic. Eyes:  Conjuctiva clear without scleral icterus. Mouth:  Oral mucosa pink and moist. Good No lesions. No OP edema. Neck:  Supple,  without mass or thyromegaly. Lungs:  Clear to auscultation bilaterally. No wheezes, rales, or rhonchi. No distress.  Heart:  S1, S2 present without murmurs, rubs, or gallops. Regular rate and rhythm. Abdomen:  +BS, soft, non-tender and non-distended. No rebound or guarding. No HSM or masses noted. Msk:  Symmetrical without gross deformities. Normal posture. Extremities:  Without edema. Neurologic:  Alert and  oriented x4;  grossly normal neurologically. Skin:  Intact without significant lesions or rashes. Psych:  Alert and cooperative. Normal mood and affect.    05/07/2014 11:27 AM

## 2014-05-08 NOTE — Progress Notes (Signed)
CC'ED TO PCP 

## 2014-05-24 ENCOUNTER — Emergency Department (HOSPITAL_COMMUNITY)
Admission: EM | Admit: 2014-05-24 | Discharge: 2014-05-25 | Disposition: A | Discharge status: Home / Self Care | Payer: Medicaid Other | Attending: Emergency Medicine | Admitting: Emergency Medicine

## 2014-05-24 ENCOUNTER — Emergency Department (HOSPITAL_COMMUNITY): Payer: Medicaid Other

## 2014-05-24 ENCOUNTER — Encounter (HOSPITAL_COMMUNITY): Payer: Self-pay | Admitting: Emergency Medicine

## 2014-05-24 DIAGNOSIS — I10 Essential (primary) hypertension: Secondary | ICD-10-CM | POA: Insufficient documentation

## 2014-05-24 DIAGNOSIS — W2209XA Striking against other stationary object, initial encounter: Secondary | ICD-10-CM | POA: Insufficient documentation

## 2014-05-24 DIAGNOSIS — Y9289 Other specified places as the place of occurrence of the external cause: Secondary | ICD-10-CM | POA: Diagnosis not present

## 2014-05-24 DIAGNOSIS — S4991XA Unspecified injury of right shoulder and upper arm, initial encounter: Secondary | ICD-10-CM | POA: Diagnosis not present

## 2014-05-24 DIAGNOSIS — M25511 Pain in right shoulder: Secondary | ICD-10-CM

## 2014-05-24 DIAGNOSIS — Y998 Other external cause status: Secondary | ICD-10-CM | POA: Insufficient documentation

## 2014-05-24 DIAGNOSIS — K219 Gastro-esophageal reflux disease without esophagitis: Secondary | ICD-10-CM | POA: Diagnosis not present

## 2014-05-24 DIAGNOSIS — G8929 Other chronic pain: Secondary | ICD-10-CM | POA: Diagnosis not present

## 2014-05-24 DIAGNOSIS — Y9389 Activity, other specified: Secondary | ICD-10-CM | POA: Insufficient documentation

## 2014-05-24 DIAGNOSIS — F329 Major depressive disorder, single episode, unspecified: Secondary | ICD-10-CM | POA: Diagnosis not present

## 2014-05-24 DIAGNOSIS — Z87891 Personal history of nicotine dependence: Secondary | ICD-10-CM | POA: Diagnosis not present

## 2014-05-24 DIAGNOSIS — M1990 Unspecified osteoarthritis, unspecified site: Secondary | ICD-10-CM | POA: Diagnosis not present

## 2014-05-24 DIAGNOSIS — F419 Anxiety disorder, unspecified: Secondary | ICD-10-CM | POA: Diagnosis not present

## 2014-05-24 LAB — CBC WITH DIFFERENTIAL/PLATELET
BASOS PCT: 1 % (ref 0–1)
Basophils Absolute: 0 10*3/uL (ref 0.0–0.1)
Eosinophils Absolute: 0.2 10*3/uL (ref 0.0–0.7)
Eosinophils Relative: 2 % (ref 0–5)
HCT: 42.2 % (ref 36.0–46.0)
Hemoglobin: 14.4 g/dL (ref 12.0–15.0)
LYMPHS ABS: 2.8 10*3/uL (ref 0.7–4.0)
Lymphocytes Relative: 36 % (ref 12–46)
MCH: 29 pg (ref 26.0–34.0)
MCHC: 34.1 g/dL (ref 30.0–36.0)
MCV: 85.1 fL (ref 78.0–100.0)
Monocytes Absolute: 0.7 10*3/uL (ref 0.1–1.0)
Monocytes Relative: 9 % (ref 3–12)
NEUTROS ABS: 4.1 10*3/uL (ref 1.7–7.7)
NEUTROS PCT: 52 % (ref 43–77)
Platelets: 346 10*3/uL (ref 150–400)
RBC: 4.96 MIL/uL (ref 3.87–5.11)
RDW: 12.7 % (ref 11.5–15.5)
WBC: 7.9 10*3/uL (ref 4.0–10.5)

## 2014-05-24 LAB — TROPONIN I: Troponin I: <0.03 ng/mL (ref <0.031)

## 2014-05-24 LAB — BASIC METABOLIC PANEL
ANION GAP: 9 (ref 5–15)
BUN: 15 mg/dL (ref 6–20)
CALCIUM: 9.6 mg/dL (ref 8.9–10.3)
CO2: 29 mmol/L (ref 22–32)
Chloride: 99 mmol/L — ABNORMAL LOW (ref 101–111)
Creatinine, Ser: 0.96 mg/dL (ref 0.44–1.00)
Glucose, Bld: 95 mg/dL (ref 65–99)
POTASSIUM: 3.5 mmol/L (ref 3.5–5.1)
SODIUM: 137 mmol/L (ref 135–145)

## 2014-05-24 NOTE — ED Notes (Signed)
Patient reports mid-sternal chest pain that she described as heaviness radiating to right shoulder blade and down right arm. Reports she has had shoulder pain to right shoulder for over a year. Patient also reports has had aching to legs bilaterally.

## 2014-05-25 ENCOUNTER — Encounter: Payer: Self-pay | Admitting: Family Medicine

## 2014-05-25 ENCOUNTER — Ambulatory Visit (INDEPENDENT_AMBULATORY_CARE_PROVIDER_SITE_OTHER): Payer: Self-pay | Admitting: Family Medicine

## 2014-05-25 VITALS — BP 92/72 | Ht 63.0 in | Wt 174.0 lb

## 2014-05-25 DIAGNOSIS — M5412 Radiculopathy, cervical region: Secondary | ICD-10-CM

## 2014-05-25 DIAGNOSIS — M5416 Radiculopathy, lumbar region: Secondary | ICD-10-CM

## 2014-05-25 DIAGNOSIS — G5601 Carpal tunnel syndrome, right upper limb: Secondary | ICD-10-CM

## 2014-05-25 DIAGNOSIS — S46091A Other injury of muscle(s) and tendon(s) of the rotator cuff of right shoulder, initial encounter: Secondary | ICD-10-CM

## 2014-05-25 MED ORDER — KETOROLAC TROMETHAMINE 60 MG/2ML IM SOLN
60.0000 mg | Freq: Once | INTRAMUSCULAR | Status: AC
  Administered 2014-05-25: 60 mg via INTRAMUSCULAR
  Filled 2014-05-25: qty 2

## 2014-05-25 MED ORDER — OXYCODONE-ACETAMINOPHEN 5-325 MG PO TABS
1.0000 | ORAL_TABLET | Freq: Once | ORAL | Status: AC
  Administered 2014-05-25: 1 via ORAL
  Filled 2014-05-25: qty 1

## 2014-05-25 MED ORDER — OXYCODONE-ACETAMINOPHEN 5-325 MG PO TABS: 1.0000 | ORAL_TABLET | Freq: Four times a day (QID) | ORAL | Status: DC | PRN

## 2014-05-25 MED ORDER — NAPROXEN 500 MG PO TABS: 500.0000 mg | ORAL_TABLET | Freq: Two times a day (BID) | ORAL | Status: DC

## 2014-05-25 MED ORDER — METHYLPREDNISOLONE 4 MG PO TBPK: ORAL_TABLET | Freq: Once | ORAL | Status: DC

## 2014-05-25 MED ORDER — DULOXETINE HCL 20 MG PO CPEP: 20.0000 mg | ORAL_CAPSULE | Freq: Every day | ORAL | Status: DC

## 2014-05-25 NOTE — ED Notes (Signed)
Discharge instructions given, pt demonstrated teach back and verbal understanding. No concerns voiced.  

## 2014-05-25 NOTE — Assessment & Plan Note (Signed)
Symptoms most consistent with cervical spine etiology. Conservative treatment for rotator cuff etiology as well as injection for rotator cuff did not provide significant lasting relief. Especially given distribution along the C8 dermatome suspect she has some significant cervical stenosis and potential for nerve root impingement. She does have both cervical and lumbar causes of her pain that seemed to be organic in nature not consistent with fibromyalgia. However given the fact this is neuropathic Cymbalta, steroid dose pack and she'll continue the Flexeril at this time.  MRI to evaluate for cervical spine given the fact this is been persistent for greater than a year and half. She has had prior workup for urologic issues that was reported as normal. You think this is a myelopathy however this needs to be further evaluated.

## 2014-05-25 NOTE — Progress Notes (Signed)
Brianna [name] - 54 y.o. female MRN [id number]  Date of birth: Feb 08, 1960  SUBJECTIVE: CC: 1.  right shoulder pain, follow-up      HPI:   one half years of worsening right arm and shoulder pain. Previously diagnosed with rotator cuff impingement, right carpal tunnel and right lumbar degenerative disc disease.   Reports acute worsening of her pain over the past 2 days. She did fall prior to this and has had worsening muscle spasms  which began the following day.  Seen in the emergency department last night, x-rays as below, given Toradol and oxycodone with moderate improvement.  Pain radiates from her right trapezius area down into her fourth and fifth fingers. Pain with overhead and abduction.   Prior tramadol no longer helpful.      ROS:  reports underlying urinary changes over the past year and a half. No difficulty walking. Marked sleep disturbance associated with this pain which has worsened.    HISTORY:  Past Medical, Surgical, Social, and Family History reviewed & updated per EMR.  Pertinent Historical Findings include: Social History   Occupational History  .      hasn't worked since October 2014. Filing for disability   Social History Main Topics  . Smoking status: Former Smoker -- 0.50 packs/day for 2 years    Types: Cigarettes    Quit date: 01/06/2007  . Smokeless tobacco: Never Used  . Alcohol Use: No  . Drug Use: No  . Sexual Activity: Not on file    No specialty comments available. Problem  Cervical Radiculitis   X-rays 05/24/2014 right shoulder: Overall well aligned no significant arthropathy. No prior cervical x-rays. Prior cervical spine herniated discs per patient report, no imaging available. Prior lung bar spine degenerative changes.   OBJECTIVE:  VS:   HT:5\' 3"  (160 cm)   WT:174 lb (78.926 kg)  BMI:30.9          BP:92/72 mmHg  HR: bpm  TEMP: ( )  RESP:   PHYSICAL EXAM: GENERAL:  adult Caucasian female. No acute distress  PSYCH: Alert  and appropriately interactive.  SKIN: No open skin lesions or abnormal skin markings on areas inspected as below  VASCULAR: radial pulse 2+/4. No significant upper show any swelling  NEURO: sensation is diminished in right C8 distribution. No significant weakness. Bilateral upper DTRs, 3/4 diffusely and symmetric. No pectoral reflex.  Negative Hoffman's. Lower extremity reflexes 3+/4 diffusely. No single significant dermatomal dysesthesia. No clonus.   RIGHT SHOULDER: Patient holds and significant guarding position. She is quite uncomfortable with motion. Able to bring her arm to 90 of abduction and an approximately 80 of external rotation with distraction & processed symmetric relaxation. Strength is limited due to pain but doesn't appear to be intact with internal rotation, external rotation and empty can testing. Speeds test deferred. Significant muscle spasm and trapezius. CERVICAL: Marked limitation in range of motion with flexion, normal extension, limited left side bending & bilateral rotation. No midline tenderness. Positive Spurling's compression test more significantly right. With radiculopathy down to the fingers.    ASSESSMENT & PLAN: See problem based charting & AVS for additional documentation Problem List Items Addressed This Visit    Right lumbar radiculopathy   Relevant Medications   DULoxetine (CYMBALTA) 20 MG capsule   Inj musc/tend the rotator cuff of right shoulder, init   Carpal tunnel syndrome of right wrist   Relevant Medications   DULoxetine (CYMBALTA) 20 MG capsule   Cervical radiculitis - Primary  Symptoms most consistent with cervical spine etiology. Conservative treatment for rotator cuff etiology as well as injection for rotator cuff did not provide significant lasting relief. Especially given distribution along the C8 dermatome suspect she has some significant cervical stenosis and potential for nerve root impingement. She does have both cervical and lumbar causes  of her pain that seemed to be organic in nature not consistent with fibromyalgia. However given the fact this is neuropathic Cymbalta, steroid dose pack and she'll continue the Flexeril at this time.  MRI to evaluate for cervical spine given the fact this is been persistent for greater than a year and half. She has had prior workup for urologic issues that was reported as normal. You think this is a myelopathy however this needs to be further evaluated.      Relevant Medications   DULoxetine (CYMBALTA) 20 MG capsule   methylPREDNISolone (MEDROL DOSEPAK) 4 MG TBPK tablet   Other Relevant Orders   MR Cervical Spine Wo Contrast     FOLLOW UP:  Return for Will call with results.

## 2014-05-25 NOTE — ED Provider Notes (Signed)
CSN: [id number]     Arrival date & time 05/24/14  2154 History   First MD Initiated Contact with Patient 05/24/14 2356     This chart was scribed for Merryl Hacker, MD by Forrestine Him, ED Scribe. This patient was seen in room APA05/APA05 and the patient's care was started 12:13 AM.   Chief Complaint  Patient presents with  . Shoulder Pain  . Chest Pain    The history is provided by the patient. No language interpreter was used.    HPI Comments: Brianna [name] is a 54 y.o. female with a PMHx of HTN and GERD who presents to the Emergency Department complaining of constant, ongoing, chronic in nature R shoulder pain x 1.5 years, worsened in last 24 hours. Pain is currently rated 10/10. Pt admits she hit her R arm against a hard surface sometime last week. [name] was last treated for shoulder pain in October of 2015 with a Cortisone injection. Pain was improved with this treatment but states discomfort has flared recently. She also reports intermittent mid-sternal chest pain onset today. Pain id described as heaviness and radiates mildly to the R shoulder blade and down the R arm. She has tried prescribed Tramadol for pain with mild temporary improvement for symptoms. No recent fever or chills. No weakness or loss of sensation. Pt with known allergy to Tylenol #3.  Denies shortness of breath.  Past Medical History  Diagnosis Date  . Depression   . Hypertension   . Anxiety   . GERD (gastroesophageal reflux disease)   . Headache   . Arthritis   . Chronic right hip pain since 09/2012   Past Surgical History  Procedure Laterality Date  . Cesarean section    . Oophorectomy    . Colonoscopy N/A 08/22/2013    HBZ:JIRCVEL diverticulosis paper hemtochezia from anorectal irritation  . Esophagogastroduodenoscopy N/A 08/22/2013    FYB:OFBPZWC reflx esophagitis/noncritical schatzki's ring and hiatial hernia   Family History  Problem Relation Age of Onset  . Colon cancer Neg Hx   .  Ovarian cancer Mother    History  Substance Use Topics  . Smoking status: Former Smoker -- 0.50 packs/day for 2 years    Types: Cigarettes    Quit date: 01/06/2007  . Smokeless tobacco: Never Used  . Alcohol Use: No   OB History    Gravida Para Term Preterm AB TAB SAB Ectopic Multiple Living   4 4 4       4      Review of Systems  Constitutional: Negative for fever and chills.  Respiratory: Negative for cough, chest tightness and shortness of breath.   Cardiovascular: Positive for chest pain. Negative for leg swelling.  Gastrointestinal: Negative for nausea, vomiting and abdominal pain.  Genitourinary: Negative for dysuria.  Musculoskeletal: Negative for back pain.       Right shoulder pain  Skin: Negative for wound.  Neurological: Negative for numbness and headaches.  All other systems reviewed and are negative.     Allergies  Tylenol with codeine #3  Home Medications   Prior to Admission medications   Medication Sig Start Date End Date Taking? Authorizing Provider  acetaminophen-codeine (TYLENOL #3) 300-30 MG per tablet Take 1 tablet by mouth every 8 (eight) hours as needed for moderate pain. 04/20/14   Lance Bosch, NP  citalopram (CELEXA) 40 MG tablet Take 1 tablet (40 mg total) by mouth daily. 04/20/14   Lance Bosch, NP  cyclobenzaprine (FLEXERIL) 10 MG  tablet Take 1 tablet (10 mg total) by mouth 3 (three) times daily as needed for muscle spasms. 09/06/13   Lance Bosch, NP  dexlansoprazole (DEXILANT) 60 MG capsule Take 1 capsule (60 mg total) by mouth daily. 05/03/14   Orvil Feil, NP  famotidine (PEPCID) 20 MG tablet Take 1 tablet (20 mg total) by mouth 2 (two) times daily. Patient not taking: Reported on 04/20/2014 11/28/13   Tammy Triplett, PA-C  gabapentin (NEURONTIN) 300 MG capsule Take 1 capsule (300 mg total) by mouth at bedtime. 02/08/14   Deanna M Didiano, DO  hydrochlorothiazide (HYDRODIURIL) 25 MG tablet Take 1 tablet (25 mg total) by mouth daily. 04/20/14    Lance Bosch, NP  hydrOXYzine (ATARAX/VISTARIL) 25 MG tablet Take 1 tablet (25 mg total) by mouth every 6 (six) hours. As needed for itching 11/28/13   Tammy Triplett, PA-C  lubiprostone (AMITIZA) 24 MCG capsule Take 1 capsule (24 mcg total) by mouth 2 (two) times daily with a meal. 05/07/14   Carlis Stable, NP  polyethylene glycol-electrolytes (TRILYTE) 420 G solution Take 4,000 mLs by mouth as directed. Patient not taking: Reported on 11/28/2013 08/11/13   Daneil Dolin, MD  traMADol (ULTRAM) 50 MG tablet TAKE 2 TABS Q8HRS PRN PAIN 02/08/14   Deanna M Didiano, DO   Triage Vitals: BP 117/81 mmHg  Pulse 81  Temp(Src) 98.2 F (36.8 C) (Oral)  Resp 18  Ht 5\' 3"  (1.6 m)  Wt 155 lb (70.308 kg)  BMI 27.46 kg/m2  SpO2 98%   Physical Exam  Constitutional: She is oriented to person, place, and time. She appears well-developed and well-nourished. No distress.  HENT:  Head: Normocephalic and atraumatic.  Cardiovascular: Normal rate, regular rhythm and normal heart sounds.   No murmur heard. Pulmonary/Chest: Effort normal and breath sounds normal. No respiratory distress. She has no wheezes. She exhibits tenderness.  Abdominal: Soft. There is no tenderness.  Musculoskeletal:  Limited range of motion of the right shoulder secondary to pain, no obvious deformities  Neurological: She is alert and oriented to person, place, and time.  5 out of 5 grip strength bilaterally  Skin: Skin is warm and dry.  Psychiatric: She has a normal mood and affect.  Nursing note and vitals reviewed.   ED Course  Procedures (including critical care time)  DIAGNOSTIC STUDIES: Oxygen Saturation is 98% on RA, Normal by my interpretation.    COORDINATION OF CARE: 12:15 AM-Will order DG shoulder R, EKG, CB, troponin I, BMP, and CXR. Will give Percocet and Toradol. Discussed treatment plan with pt at bedside and pt agreed to plan.     Labs Review Labs Reviewed  BASIC METABOLIC PANEL - Abnormal; Notable for the  following:    Chloride 99 (*)    All other components within normal limits  CBC WITH DIFFERENTIAL/PLATELET  TROPONIN I    Imaging Review Dg Chest 2 View  05/24/2014   CLINICAL DATA:  Chest heaviness, nausea, and headache for 2 hours, history hypertension, former smoker  EXAM: CHEST  2 VIEW  COMPARISON:  07/27/2013  FINDINGS: Normal heart size, mediastinal contours, and pulmonary vascularity.  Lungs clear.  No pleural effusion or pneumothorax.  Bones unremarkable.  IMPRESSION: No acute abnormalities.   Electronically Signed   By: Lavonia Dana M.D.   On: 05/24/2014 22:33   Dg Shoulder Right  05/24/2014   CLINICAL DATA:  RIGHT shoulder pain for 1 year worse over past 2 days, chest pain, no injury  EXAM:  RIGHT SHOULDER - 2+ VIEW  COMPARISON:  None  FINDINGS: Bones demineralized.  AC joint alignment normal.  No acute fracture, dislocation, or bone destruction.  Visualized RIGHT ribs intact.  IMPRESSION: No acute osseous abnormalities.   Electronically Signed   By: Lavonia Dana M.D.   On: 05/24/2014 22:39     EKG Interpretation   Date/Time:  Thursday May 24 2014 22:09:50 EDT Ventricular Rate:  74 PR Interval:  146 QRS Duration: 84 QT Interval:  432 QTC Calculation: 479 R Axis:   16 Text Interpretation:  Normal sinus rhythm No significant change since last  tracing Confirmed by HORTON  MD, COURTNEY ([id number]) on 05/24/2014 11:55:19 PM      MDM   Final diagnoses:  Right shoulder pain    Patient presents with right shoulder pain. This is chronic in nature. Also developed right-sided chest pain earlier today. Chest pain is reproducible on exam and patient relates this to her right shoulder. Suspect musculoskeletal etiology. Low suspicion for ACS. EKG is unchanged from prior and chest x-ray and troponin negative. Patient given Toradol and Percocet. Discussed with patient follow-up with sports medicine for further management of her chronic right shoulder pain. Patient will be given a short course  of pain medication but was advised that she will need follow-up with her primary physician or sports medicine  for any further medication.  After history, exam, and medical workup I feel the patient has been appropriately medically screened and is safe for discharge home. Pertinent diagnoses were discussed with the patient. Patient was given return precautions.  I personally performed the services described in this documentation, which was scribed in my presence. The recorded information has been reviewed and is accurate.   Merryl Hacker, MD 05/25/14 [phone]

## 2014-05-25 NOTE — Discharge Instructions (Signed)
You were seen today for shoulder pain. Her pain now extends anterior chest. Her pain is reproducible on exam which suggest a muscular cause of your pain. He will be given a short course of pain medication but should follow-up with her primary physician or sports medicine for any further pain medications. You should also start taking an anti-inflammatory. Your chest pain work-up is reassuring.  Shoulder Pain The shoulder is the joint that connects your arms to your body. The bones that form the shoulder joint include the upper arm bone (humerus), the shoulder blade (scapula), and the collarbone (clavicle). The top of the humerus is shaped like a ball and fits into a rather flat socket on the scapula (glenoid cavity). A combination of muscles and strong, fibrous tissues that connect muscles to bones (tendons) support your shoulder joint and hold the ball in the socket. Small, fluid-filled sacs (bursae) are located in different areas of the joint. They act as cushions between the bones and the overlying soft tissues and help reduce friction between the gliding tendons and the bone as you move your arm. Your shoulder joint allows a wide range of motion in your arm. This range of motion allows you to do things like scratch your back or throw a ball. However, this range of motion also makes your shoulder more prone to pain from overuse and injury. Causes of shoulder pain can originate from both injury and overuse and usually can be grouped in the following four categories:  Redness, swelling, and pain (inflammation) of the tendon (tendinitis) or the bursae (bursitis).  Instability, such as a dislocation of the joint.  Inflammation of the joint (arthritis).  Broken bone (fracture). HOME CARE INSTRUCTIONS   Apply ice to the sore area.  Put ice in a plastic bag.  Place a towel between your skin and the bag.  Leave the ice on for 15-20 minutes, 3-4 times per day for the first 2 days, or as directed by  your health care provider.  Stop using cold packs if they do not help with the pain.  If you have a shoulder sling or immobilizer, wear it as long as your caregiver instructs. Only remove it to shower or bathe. Move your arm as little as possible, but keep your hand moving to prevent swelling.  Squeeze a soft ball or foam pad as much as possible to help prevent swelling.  Only take over-the-counter or prescription medicines for pain, discomfort, or fever as directed by your caregiver. SEEK MEDICAL CARE IF:   Your shoulder pain increases, or new pain develops in your arm, hand, or fingers.  Your hand or fingers become cold and numb.  Your pain is not relieved with medicines. SEEK IMMEDIATE MEDICAL CARE IF:   Your arm, hand, or fingers are numb or tingling.  Your arm, hand, or fingers are significantly swollen or turn white or blue. MAKE SURE YOU:   Understand these instructions.  Will watch your condition.  Will get help right away if you are not doing well or get worse. Document Released: 10/01/2004 Document Revised: 05/08/2013 Document Reviewed: 12/06/2010 [facility] Patient Information 2015 [place], [place]. This information is not intended to replace advice given to you by your health care provider. Make sure you discuss any questions you have with your health care provider.

## 2014-05-28 ENCOUNTER — Telehealth: Payer: Self-pay | Admitting: *Deleted

## 2014-05-28 NOTE — Telephone Encounter (Signed)
-----   Message from [name] sent at 05/28/2014 10:42 AM EDT ----- Regarding: med questions Contact: [phone] Pt cant afford scripts that she got at last visit. Wants to know if there is something cheaper she can get prescribed .

## 2014-05-28 NOTE — Progress Notes (Signed)
[facility]: Attending Note: I have reviewed the chart, discussed wit the Sports Medicine Fellow. I agree with assessment and treatment plan as detailed in the [place] note. Unclear if there is a cervical radiculopathy contributing to her right shoulder pain ior if this is all from joint. Will get cervical MRI. If negative, then we likely need to image (potentially arthrogram) shoulder as she is pretty debilitated with loss of ROM secondary to pain. Agree with steroid burst / dose pack and starting cymbalta for pain relief.

## 2014-05-28 NOTE — Telephone Encounter (Signed)
Patient said one rx was $100 and the other was $30, she couldn't remember which one was what.  So I'm thinking the cymbalta was the $100.  Is there anything else to substitute for that, for less cost?

## 2014-05-30 MED ORDER — AMITRIPTYLINE HCL 25 MG PO TABS: 25.0000 mg | ORAL_TABLET | Freq: Two times a day (BID) | ORAL | Status: DC

## 2014-05-30 NOTE — Telephone Encounter (Signed)
-----   Message from [name] sent at 05/28/2014 10:42 AM EDT ----- Regarding: med questions Contact: [phone] Pt cant afford scripts that she got at last visit. Wants to know if there is something cheaper she can get prescribed .

## 2014-05-30 NOTE — Telephone Encounter (Signed)
Rhea Only thing I can come up with is the amitriptylene--I sent it to [facility]--i think it is on $4 list THANKS! Dorcas Mcmurray  .

## 2014-05-30 NOTE — Telephone Encounter (Signed)
Did you look at some other options for her?

## 2014-06-02 ENCOUNTER — Ambulatory Visit
Admission: RE | Admit: 2014-06-02 | Discharge: 2014-06-02 | Disposition: A | Discharge status: Home / Self Care | Payer: No Typology Code available for payment source | Source: Ambulatory Visit | Attending: Family Medicine | Admitting: Family Medicine

## 2014-06-02 DIAGNOSIS — M5412 Radiculopathy, cervical region: Secondary | ICD-10-CM

## 2014-06-05 ENCOUNTER — Telehealth: Payer: Self-pay | Admitting: *Deleted

## 2014-06-05 NOTE — Telephone Encounter (Signed)
Did you want me to call with her results?

## 2014-06-05 NOTE — Telephone Encounter (Signed)
See note from patient

## 2014-06-05 NOTE — Telephone Encounter (Signed)
-----   Message from [name] sent at 06/05/2014  9:28 AM EDT ----- Regarding: mri results Contact: [phone] Pt called to get her Neck mri results.

## 2014-06-05 NOTE — Telephone Encounter (Signed)
-----   Message from [name] sent at 06/05/2014 10:34 AM EDT ----- Regarding: question from patient Pt called asking if she should keep her appt for Thursday since she was just seen on Friday. Also, she is waiting to hear about her MRI results.

## 2014-06-05 NOTE — Telephone Encounter (Signed)
Brianna [name] neck MRI shows some pretty significant arthritis changes--it would help if she kept [name] appt (is it with Paulla Fore?) Thursday so we can talk about options. I can send [name] the report but it would be nice if we could go over [name] pictures together THANKS! Dorcas Mcmurray

## 2014-06-07 ENCOUNTER — Ambulatory Visit (INDEPENDENT_AMBULATORY_CARE_PROVIDER_SITE_OTHER): Payer: Self-pay | Admitting: Sports Medicine

## 2014-06-07 ENCOUNTER — Encounter: Payer: Self-pay | Admitting: Sports Medicine

## 2014-06-07 VITALS — BP 101/69 | Ht 63.0 in | Wt 174.0 lb

## 2014-06-07 DIAGNOSIS — M5010 Cervical disc disorder with radiculopathy, unspecified cervical region: Secondary | ICD-10-CM

## 2014-06-07 DIAGNOSIS — M5412 Radiculopathy, cervical region: Secondary | ICD-10-CM

## 2014-06-07 DIAGNOSIS — M7501 Adhesive capsulitis of right shoulder: Secondary | ICD-10-CM

## 2014-06-07 MED ORDER — GABAPENTIN 300 MG PO CAPS: 300.0000 mg | ORAL_CAPSULE | Freq: Two times a day (BID) | ORAL | Status: DC

## 2014-06-07 MED ORDER — CYCLOBENZAPRINE HCL 10 MG PO TABS: 10.0000 mg | ORAL_TABLET | Freq: Three times a day (TID) | ORAL | Status: DC | PRN

## 2014-06-07 MED ORDER — OXYCODONE-ACETAMINOPHEN 5-325 MG PO TABS: 1.0000 | ORAL_TABLET | Freq: Three times a day (TID) | ORAL | Status: DC | PRN

## 2014-06-08 ENCOUNTER — Other Ambulatory Visit: Payer: Self-pay | Admitting: Sports Medicine

## 2014-06-08 DIAGNOSIS — M7501 Adhesive capsulitis of right shoulder: Secondary | ICD-10-CM | POA: Insufficient documentation

## 2014-06-08 DIAGNOSIS — M5010 Cervical disc disorder with radiculopathy, unspecified cervical region: Secondary | ICD-10-CM

## 2014-06-08 NOTE — Progress Notes (Signed)
Brianna [name] - 54 y.o. female MRN [id number]  Date of birth: 02-06-1960  SUBJECTIVE:  Including CC & ROS.  Brianna [name] is a 54 year old female with past medical history significant for IBS-constipation, GERD, lumbar degenerative disc disease with radiculopathy, depression/anxiety, and fibromyalgia. Patient presents today for follow-up for right shoulder and cervical neck discomfort. She has known MSK issues of right shoulder pain with some frozen shoulder characteristics, lumbar degenerative disc disease, carpal tunnel syndrome.  Right shoulder and arm pain: Patient has had 6 months of right shoulder. Initially seen back in March and was suspected to have rotator cuff tendinopathy given her poor range of motion and discomfort with rotator cuff muscle testing. She was treated with a subacromial injection which did provide the patient approximately 2 months of relief. The pain acutely worsened this past month and may after a fall. She was seen in the emergency room with normal x-rays and given a shot of Toradol and oxycodone for pain control. Toradol did not provide her any relief and the oxycodone was only temporary relief. Patient currently describes her pain as discomfort in her upper trap shoulder with radiating symptoms of numbness and tingling down into the fourth and fifth digits of her hand. Pain is worse with neck rotation and overhead movement of her shoulder. Since she ran out of oxycodone she's been using tramadol for pain control reports little clinical improvement. She also has been taking gabapentin 300 mg at bedtime since we started treating her lumbar radiculopathy she does find this medication be effective. She has not been using Flexeril more recently for this new onset cervical neck pain. Patient denies any weakness in the hand She is right-hand dominant. At her last visit in May patient was treated with prednisone dose pack to did provide her some pain relief and was sent for an MRI of the  neck.  ROS: Review of systems otherwise negative except for information present in HPI  HISTORY: Past Medical, Surgical, Social, and Family History Reviewed & Updated per EMR. Pertinent Historical Findings include:  History listed above with several psychosocial stressors the propagated her uncontrolled pain  DATA REVIEWED: 06/02/14 MRI cervical spine: Notable C5-C6 spondylosis with disc deformity of the ventral cord causing moderate to severe bilateral foraminal narrowing, moderate bilateral foraminal narrowing at C4-C5/ C6-C7, and moderate to severe foraminal narrowing on the left C3-C4  PHYSICAL EXAM:  VS: BP:101/69 mmHg  HR: bpm  TEMP: ( )  RESP:   HT:5\' 3"  (160 cm)   WT:174 lb (78.926 kg)  BMI:30.9 UPPER BACK EXAM: General: well nourished Skin of UE: warm; dry, no rashes, lesions, ecchymosis or erythema. Vascular: radial pulses 2+ bilaterally Observation: Decreased lordosis in the cervical spine due to muscle spasm. Increased tension and elevation of the right shoulder due to muscle spasm. Symmetric scapula Palpation: Tenderness to palpation over the anterior and lateral shoulder. Increased muscle tension and spasming in the upper trap and pericervical muscles. No obvious step off defects throughout the cervical or thoracic spine. Range of motion: Decreased range of motion in neck flexion and extension. Decreased range of motion in neck rotation. Patient has increased pain with increasing radicular symptoms particularly with neck extension and right-sided rotation. Special tests: Positive Spurling sign on the right with radicular symptoms propagated down the right arm Motor and sensory: Shoulder Abduction (C5) Intact Elbow Flexion (C6) intact Shoulder Extension above head (C7) intact Forearm Pronation - C7/8 intact Wrist Extension (C6) intact Wrist Flexion (C7) intact Fingers Extension/ Flexion (C7, C8) intact  Finger Abduction/adduction (T1) intact  ASSESSMENT & PLAN: See  problem based charting & AVS for pt instructions. Impression: 1. Multilevel cervical degenerative disc disease with foraminal narrowing causing cervical radiculopathy 2. Double crush in the room with known carpal tunnel 3. Early stages of frozen shoulder likely secondary to cervical neck pain 4. Known right-sided lumbar radiculopathy  Recommendations: -Discuss patient's MRI and length with her explaining that she has multiple levels of foraminal narrowing propagating her radicular symptoms. Treatment options include continue medication management, physical therapy, and epidural steroidal injections. -Patient is restricted financially and thus would not like to make any significant changes to medication and does not feel like she can afford physical therapy. She is [facility] in [facility]. [facility] refer patient to interventional radiology for ESI injection. Will follow-up with patient 2 weeks after to discuss her response and likely start patient into some physical therapy. -Medication adjustments including refilling patient's Flexeril for muscle spasms. Increasing gabapentin to 300 mg twice a day. And refilling oxycodone for temporary breakthrough pain control for the next week until patient has ESI. -Also provided patient with cervical neck collar for additional support and muscle relaxation. -Educated patient about possible referral to neurosurgery if she does not respond well to these interventions.

## 2014-06-11 ENCOUNTER — Ambulatory Visit
Admission: RE | Admit: 2014-06-11 | Discharge: 2014-06-11 | Disposition: A | Discharge status: Home / Self Care | Payer: Self-pay | Source: Ambulatory Visit | Attending: Sports Medicine | Admitting: Sports Medicine

## 2014-06-11 VITALS — BP 110/82 | HR 81

## 2014-06-11 DIAGNOSIS — M5412 Radiculopathy, cervical region: Secondary | ICD-10-CM

## 2014-06-11 DIAGNOSIS — M5010 Cervical disc disorder with radiculopathy, unspecified cervical region: Secondary | ICD-10-CM

## 2014-06-11 MED ORDER — TRIAMCINOLONE ACETONIDE 40 MG/ML IJ SUSP (RADIOLOGY)
60.0000 mg | Freq: Once | INTRAMUSCULAR | Status: AC
  Administered 2014-06-11: 60 mg via EPIDURAL

## 2014-06-11 MED ORDER — IOHEXOL 300 MG/ML  SOLN
1.0000 mL | Freq: Once | INTRAMUSCULAR | Status: AC | PRN
  Administered 2014-06-11: 1 mL via EPIDURAL

## 2014-06-11 NOTE — Discharge Instructions (Signed)

## 2014-07-19 ENCOUNTER — Encounter: Payer: Self-pay | Admitting: *Deleted

## 2014-08-04 ENCOUNTER — Encounter (HOSPITAL_COMMUNITY): Payer: Self-pay | Admitting: Emergency Medicine

## 2014-08-04 ENCOUNTER — Emergency Department (HOSPITAL_COMMUNITY)
Admission: EM | Admit: 2014-08-04 | Discharge: 2014-08-04 | Disposition: A | Discharge status: Home / Self Care | Payer: Medicaid Other | Source: Home / Self Care | Attending: Emergency Medicine | Admitting: Emergency Medicine

## 2014-08-04 DIAGNOSIS — R112 Nausea with vomiting, unspecified: Secondary | ICD-10-CM

## 2014-08-04 DIAGNOSIS — F329 Major depressive disorder, single episode, unspecified: Secondary | ICD-10-CM

## 2014-08-04 DIAGNOSIS — G8929 Other chronic pain: Secondary | ICD-10-CM | POA: Insufficient documentation

## 2014-08-04 DIAGNOSIS — F419 Anxiety disorder, unspecified: Secondary | ICD-10-CM

## 2014-08-04 DIAGNOSIS — I10 Essential (primary) hypertension: Secondary | ICD-10-CM

## 2014-08-04 DIAGNOSIS — Z79899 Other long term (current) drug therapy: Secondary | ICD-10-CM | POA: Insufficient documentation

## 2014-08-04 DIAGNOSIS — R1011 Right upper quadrant pain: Secondary | ICD-10-CM

## 2014-08-04 DIAGNOSIS — K219 Gastro-esophageal reflux disease without esophagitis: Secondary | ICD-10-CM | POA: Insufficient documentation

## 2014-08-04 DIAGNOSIS — Z8739 Personal history of other diseases of the musculoskeletal system and connective tissue: Secondary | ICD-10-CM | POA: Insufficient documentation

## 2014-08-04 DIAGNOSIS — Z87891 Personal history of nicotine dependence: Secondary | ICD-10-CM

## 2014-08-04 LAB — URINALYSIS, ROUTINE W REFLEX MICROSCOPIC
BILIRUBIN URINE: NEGATIVE
Glucose, UA: NEGATIVE mg/dL
HGB URINE DIPSTICK: NEGATIVE
Leukocytes, UA: NEGATIVE
Nitrite: NEGATIVE
PH: 6 (ref 5.0–8.0)
PROTEIN: NEGATIVE mg/dL
Specific Gravity, Urine: >1.03 — ABNORMAL HIGH (ref 1.005–1.030)
UROBILINOGEN UA: 0.2 mg/dL (ref 0.0–1.0)

## 2014-08-04 LAB — CBC WITH DIFFERENTIAL/PLATELET
Basophils Absolute: 0 10*3/uL (ref 0.0–0.1)
Basophils Relative: 0 % (ref 0–1)
EOS ABS: 0.1 10*3/uL (ref 0.0–0.7)
EOS PCT: 1 % (ref 0–5)
HCT: 39.2 % (ref 36.0–46.0)
Hemoglobin: 13.4 g/dL (ref 12.0–15.0)
Lymphocytes Relative: 20 % (ref 12–46)
Lymphs Abs: 1.8 10*3/uL (ref 0.7–4.0)
MCH: 29.1 pg (ref 26.0–34.0)
MCHC: 34.2 g/dL (ref 30.0–36.0)
MCV: 85 fL (ref 78.0–100.0)
MONO ABS: 0.8 10*3/uL (ref 0.1–1.0)
Monocytes Relative: 9 % (ref 3–12)
NEUTROS ABS: 6.5 10*3/uL (ref 1.7–7.7)
NEUTROS PCT: 70 % (ref 43–77)
Platelets: 272 10*3/uL (ref 150–400)
RBC: 4.61 MIL/uL (ref 3.87–5.11)
RDW: 12.7 % (ref 11.5–15.5)
WBC: 9.2 10*3/uL (ref 4.0–10.5)

## 2014-08-04 LAB — COMPREHENSIVE METABOLIC PANEL
ALT: 60 U/L — AB (ref 14–54)
AST: 126 U/L — ABNORMAL HIGH (ref 15–41)
Albumin: 4 g/dL (ref 3.5–5.0)
Alkaline Phosphatase: 96 U/L (ref 38–126)
Anion gap: 9 (ref 5–15)
BILIRUBIN TOTAL: 0.9 mg/dL (ref 0.3–1.2)
BUN: 17 mg/dL (ref 6–20)
CO2: 29 mmol/L (ref 22–32)
Calcium: 9.2 mg/dL (ref 8.9–10.3)
Chloride: 101 mmol/L (ref 101–111)
Creatinine, Ser: 0.93 mg/dL (ref 0.44–1.00)
GFR calc Af Amer: >60 mL/min (ref >60)
GFR calc non Af Amer: >60 mL/min (ref >60)
Glucose, Bld: 113 mg/dL — ABNORMAL HIGH (ref 65–99)
POTASSIUM: 3.1 mmol/L — AB (ref 3.5–5.1)
Sodium: 139 mmol/L (ref 135–145)
TOTAL PROTEIN: 6.7 g/dL (ref 6.5–8.1)

## 2014-08-04 LAB — LIPASE, BLOOD: Lipase: 12 U/L — ABNORMAL LOW (ref 22–51)

## 2014-08-04 MED ORDER — MORPHINE SULFATE 4 MG/ML IJ SOLN
4.0000 mg | Freq: Once | INTRAMUSCULAR | Status: AC
  Administered 2014-08-04: 4 mg via INTRAVENOUS
  Filled 2014-08-04: qty 1

## 2014-08-04 MED ORDER — OXYCODONE HCL 5 MG PO TABS: 5.0000 mg | ORAL_TABLET | ORAL | Status: DC | PRN

## 2014-08-04 MED ORDER — ONDANSETRON 4 MG PO TBDP: ORAL_TABLET | ORAL | Status: DC

## 2014-08-04 MED ORDER — ONDANSETRON HCL 4 MG/2ML IJ SOLN
4.0000 mg | Freq: Once | INTRAMUSCULAR | Status: AC
  Administered 2014-08-04: 4 mg via INTRAVENOUS
  Filled 2014-08-04: qty 2

## 2014-08-04 NOTE — ED Notes (Signed)
Pt states that she has been having abd pain with nausea for an hour.

## 2014-08-04 NOTE — ED Notes (Signed)
Patient's ride is on the way.

## 2014-08-04 NOTE — ED Provider Notes (Signed)
CSN: [id number]     Arrival date & time 08/04/14  1549 History   First MD Initiated Contact with Patient 08/04/14 1614     Chief Complaint  Patient presents with  . Abdominal Pain     (Consider location/radiation/quality/duration/timing/severity/associated sxs/prior Treatment) Patient is a 54 y.o. female presenting with abdominal pain. The history is provided by the patient.  Abdominal Pain Pain location:  Generalized Pain quality: aching and cramping   Pain radiates to:  Does not radiate Pain severity:  Moderate Onset quality:  Sudden Duration:  1 hour Timing:  Constant Chronicity:  Recurrent Context: eating   Relieved by:  Nothing Worsened by:  Nothing tried Ineffective treatments:  None tried Associated symptoms: nausea   Associated symptoms: no chest pain, no chills, no dysuria, no fever, no shortness of breath and no vomiting   Risk factors: obesity   Risk factors: has not had multiple surgeries    54 yo F with a chief complaint of abdominal pain. Patient states his pain has been occurring from time to time after she eats fatty foods. Pain is usually generalized she states feels like it's moving through her intestines. Crampy comes and goes. Has had some nausea associated with this. Has seen GI been scoped found to have diverticulosis. Patient denies fevers or chills. Denies vomiting. Denies diarrhea.  Past Medical History  Diagnosis Date  . Depression   . Hypertension   . Anxiety   . GERD (gastroesophageal reflux disease)   . Headache   . Arthritis   . Chronic right hip pain since 09/2012   Past Surgical History  Procedure Laterality Date  . Cesarean section    . Oophorectomy    . Colonoscopy N/A 08/22/2013    [id number] diverticulosis paper hemtochezia from anorectal irritation  . Esophagogastroduodenoscopy N/A 08/22/2013    [id number] reflx esophagitis/noncritical schatzki's ring and hiatial hernia   Family History  Problem Relation Age of Onset  . Colon  cancer Neg Hx   . Ovarian cancer Mother    History  Substance Use Topics  . Smoking status: Former Smoker -- 0.50 packs/day for 2 years    Types: Cigarettes    Quit date: 01/06/2007  . Smokeless tobacco: Never Used  . Alcohol Use: No   OB History    Gravida Para Term Preterm AB TAB SAB Ectopic Multiple Living   4 4 4       4      Review of Systems  Constitutional: Negative for fever and chills.  HENT: Negative for congestion and rhinorrhea.   Eyes: Negative for redness and visual disturbance.  Respiratory: Negative for shortness of breath and wheezing.   Cardiovascular: Negative for chest pain and palpitations.  Gastrointestinal: Positive for nausea and abdominal pain. Negative for vomiting.  Genitourinary: Negative for dysuria and urgency.  Musculoskeletal: Negative for myalgias and arthralgias.  Skin: Negative for pallor and wound.  Neurological: Negative for dizziness and headaches.      Allergies  Tylenol with codeine #3  Home Medications   Prior to Admission medications   Medication Sig Start Date End Date Taking? Authorizing Provider  citalopram (CELEXA) 40 MG tablet Take 1 tablet (40 mg total) by mouth daily. 04/20/14  Yes Lance Bosch, NP  cyclobenzaprine (FLEXERIL) 10 MG tablet Take 1 tablet (10 mg total) by mouth 3 (three) times daily as needed for muscle spasms. 06/07/14  Yes Deanna M Didiano, DO  dexlansoprazole (DEXILANT) 60 MG capsule Take 1 capsule (60 mg total) by mouth daily.  05/03/14  Yes Orvil Feil, NP  gabapentin (NEURONTIN) 300 MG capsule Take 1 capsule (300 mg total) by mouth 2 (two) times daily. 06/07/14  Yes Deanna M Didiano, DO  hydrochlorothiazide (HYDRODIURIL) 25 MG tablet Take 1 tablet (25 mg total) by mouth daily. 04/20/14  Yes Lance Bosch, NP  lubiprostone (AMITIZA) 24 MCG capsule Take 1 capsule (24 mcg total) by mouth 2 (two) times daily with a meal. Patient taking differently: Take 24 mcg by mouth 2 (two) times daily as needed for  constipation.  05/07/14  Yes Carlis Stable, NP  hydrOXYzine (ATARAX/VISTARIL) 25 MG tablet Take 1 tablet (25 mg total) by mouth every 6 (six) hours. As needed for itching 11/28/13   Tammy Triplett, PA-C  ondansetron (ZOFRAN ODT) 4 MG disintegrating tablet 4mg  ODT q4 hours prn nausea/vomit 08/04/14   Deno Etienne, DO  oxyCODONE (ROXICODONE) 5 MG immediate release tablet Take 1 tablet (5 mg total) by mouth every 4 (four) hours as needed for severe pain. 08/04/14   Deno Etienne, DO  oxyCODONE-acetaminophen (PERCOCET/ROXICET) 5-325 MG per tablet Take 1 tablet by mouth every 8 (eight) hours as needed for severe pain. 06/07/14   Deanna M Didiano, DO   BP 112/78 mmHg  Pulse 61  Temp(Src) 98 F (36.7 C) (Oral)  Resp 16  Ht 5\' 3"  (1.6 m)  Wt 148 lb (67.132 kg)  BMI 26.22 kg/m2  SpO2 100% Physical Exam  Constitutional: She is oriented to person, place, and time. She appears well-developed and well-nourished. No distress.  HENT:  Head: Normocephalic and atraumatic.  Eyes: EOM are normal. Pupils are equal, round, and reactive to light.  Neck: Normal range of motion. Neck supple.  Cardiovascular: Normal rate and regular rhythm.  Exam reveals no gallop and no friction rub.   No murmur heard. Pulmonary/Chest: Effort normal. She has no wheezes. She has no rales.  Abdominal: Soft. She exhibits no distension. There is tenderness (Worse in the right upper quadrant). There is no rebound and no guarding.  Musculoskeletal: She exhibits no edema or tenderness.  Neurological: She is alert and oriented to person, place, and time.  Skin: Skin is warm and dry. She is not diaphoretic.  Psychiatric: She has a normal mood and affect. Her behavior is normal.    ED Course  Procedures (including critical care time) Labs Review Labs Reviewed  COMPREHENSIVE METABOLIC PANEL - Abnormal; Notable for the following:    Potassium 3.1 (*)    Glucose, Bld 113 (*)    AST 126 (*)    ALT 60 (*)    All other components within normal  limits  LIPASE, BLOOD - Abnormal; Notable for the following:    Lipase 12 (*)    All other components within normal limits  URINALYSIS, ROUTINE W REFLEX MICROSCOPIC (NOT AT [facility]) - Abnormal; Notable for the following:    Specific Gravity, Urine >1.030 (*)    Ketones, ur TRACE (*)    All other components within normal limits  CBC WITH DIFFERENTIAL/PLATELET    Imaging Review No results found.   EKG Interpretation None      MDM   Final diagnoses:  RUQ pain    54 yo F with a chief complaint of abdominal pain. There's been an off and on problem for this patient. History consistent with gallstones. Pain worst in the right upper quadrant negative Murphy sign. We'll obtain a right upper quadrant ultrasound. CBC CMP and lipase UA.  Lab work unremarkable.  Mild hypokalemia. Will  have recheck with PCP.  Unable to obtain an ultrasound at this time.  Pain improved with pain meds and zofran.    I have discussed the diagnosis/risks/treatment options with the patient and believe the pt to be eligible for discharge home to follow-up with PCP. We also discussed returning to the ED immediately if new or worsening sx occur. We discussed the sx which are most concerning (e.g., Fever, uncontrolled pain) that necessitate immediate return. Medications administered to the patient during their visit and any new prescriptions provided to the patient are listed below.  Medications given during this visit Medications  morphine 4 MG/ML injection 4 mg (4 mg Intravenous Given 08/04/14 1707)  ondansetron (ZOFRAN) injection 4 mg (4 mg Intravenous Given 08/04/14 1707)    Discharge Medication List as of 08/04/2014  6:00 PM    START taking these medications   Details  ondansetron (ZOFRAN ODT) 4 MG disintegrating tablet 4mg  ODT q4 hours prn nausea/vomit, Print    oxyCODONE (ROXICODONE) 5 MG immediate release tablet Take 1 tablet (5 mg total) by mouth every 4 (four) hours as needed for severe pain., Starting  08/04/2014, Until Discontinued, Print         The patient appears reasonably screen and/or stabilized for discharge and I doubt any other medical condition or other [facility] requiring further screening, evaluation, or treatment in the ED at this time prior to discharge.     Deno Etienne, DO 08/04/14 2105

## 2014-08-04 NOTE — ED Notes (Signed)
Patient calling ride again.

## 2014-08-04 NOTE — ED Notes (Signed)
EDP updated that ultrasonography is not here, but ultrasound can be done in the morning.

## 2014-08-04 NOTE — Discharge Instructions (Signed)

## 2014-08-05 ENCOUNTER — Encounter (HOSPITAL_COMMUNITY): Payer: Self-pay | Admitting: Emergency Medicine

## 2014-08-05 ENCOUNTER — Emergency Department (HOSPITAL_COMMUNITY): Payer: Medicaid Other

## 2014-08-05 ENCOUNTER — Inpatient Hospital Stay (HOSPITAL_COMMUNITY)
Admission: EM | Admit: 2014-08-05 | Discharge: 2014-08-08 | DRG: 440 | Disposition: A | Discharge status: Home / Self Care | Payer: Medicaid Other | Attending: Internal Medicine | Admitting: Internal Medicine

## 2014-08-05 DIAGNOSIS — K219 Gastro-esophageal reflux disease without esophagitis: Secondary | ICD-10-CM | POA: Diagnosis present

## 2014-08-05 DIAGNOSIS — K859 Acute pancreatitis without necrosis or infection, unspecified: Secondary | ICD-10-CM | POA: Diagnosis present

## 2014-08-05 DIAGNOSIS — F329 Major depressive disorder, single episode, unspecified: Secondary | ICD-10-CM | POA: Diagnosis present

## 2014-08-05 DIAGNOSIS — Z87891 Personal history of nicotine dependence: Secondary | ICD-10-CM

## 2014-08-05 DIAGNOSIS — E876 Hypokalemia: Secondary | ICD-10-CM | POA: Diagnosis present

## 2014-08-05 DIAGNOSIS — R109 Unspecified abdominal pain: Secondary | ICD-10-CM | POA: Diagnosis present

## 2014-08-05 DIAGNOSIS — R7989 Other specified abnormal findings of blood chemistry: Secondary | ICD-10-CM | POA: Diagnosis present

## 2014-08-05 DIAGNOSIS — Z8041 Family history of malignant neoplasm of ovary: Secondary | ICD-10-CM

## 2014-08-05 DIAGNOSIS — R945 Abnormal results of liver function studies: Secondary | ICD-10-CM

## 2014-08-05 DIAGNOSIS — I10 Essential (primary) hypertension: Secondary | ICD-10-CM | POA: Diagnosis present

## 2014-08-05 DIAGNOSIS — M1990 Unspecified osteoarthritis, unspecified site: Secondary | ICD-10-CM | POA: Diagnosis present

## 2014-08-05 LAB — COMPREHENSIVE METABOLIC PANEL
ALBUMIN: 4 g/dL (ref 3.5–5.0)
ALT: 450 U/L — AB (ref 14–54)
AST: 529 U/L — ABNORMAL HIGH (ref 15–41)
Alkaline Phosphatase: 138 U/L — ABNORMAL HIGH (ref 38–126)
Anion gap: 9 (ref 5–15)
BUN: 9 mg/dL (ref 6–20)
CALCIUM: 9.2 mg/dL (ref 8.9–10.3)
CO2: 30 mmol/L (ref 22–32)
Chloride: 101 mmol/L (ref 101–111)
Creatinine, Ser: 0.72 mg/dL (ref 0.44–1.00)
GFR calc non Af Amer: >60 mL/min (ref >60)
GLUCOSE: 111 mg/dL — AB (ref 65–99)
Potassium: 3.3 mmol/L — ABNORMAL LOW (ref 3.5–5.1)
SODIUM: 140 mmol/L (ref 135–145)
TOTAL PROTEIN: 7.2 g/dL (ref 6.5–8.1)
Total Bilirubin: 2.8 mg/dL — ABNORMAL HIGH (ref 0.3–1.2)

## 2014-08-05 LAB — CBC WITH DIFFERENTIAL/PLATELET
BASOS PCT: 0 % (ref 0–1)
Basophils Absolute: 0 10*3/uL (ref 0.0–0.1)
EOS ABS: 0.1 10*3/uL (ref 0.0–0.7)
Eosinophils Relative: 2 % (ref 0–5)
HCT: 42.1 % (ref 36.0–46.0)
Hemoglobin: 14.3 g/dL (ref 12.0–15.0)
Lymphocytes Relative: 25 % (ref 12–46)
Lymphs Abs: 1.3 10*3/uL (ref 0.7–4.0)
MCH: 29 pg (ref 26.0–34.0)
MCHC: 34 g/dL (ref 30.0–36.0)
MCV: 85.4 fL (ref 78.0–100.0)
Monocytes Absolute: 0.6 10*3/uL (ref 0.1–1.0)
Monocytes Relative: 12 % (ref 3–12)
NEUTROS ABS: 3.1 10*3/uL (ref 1.7–7.7)
NEUTROS PCT: 61 % (ref 43–77)
PLATELETS: 274 10*3/uL (ref 150–400)
RBC: 4.93 MIL/uL (ref 3.87–5.11)
RDW: 12.8 % (ref 11.5–15.5)
WBC: 5.2 10*3/uL (ref 4.0–10.5)

## 2014-08-05 LAB — LIPASE, BLOOD: Lipase: 711 U/L — ABNORMAL HIGH (ref 22–51)

## 2014-08-05 MED ORDER — ONDANSETRON HCL 4 MG PO TABS: 4.0000 mg | ORAL_TABLET | Freq: Four times a day (QID) | ORAL | Status: DC | PRN

## 2014-08-05 MED ORDER — SODIUM CHLORIDE 0.9 % IV BOLUS (SEPSIS)
1000.0000 mL | Freq: Once | INTRAVENOUS | Status: AC
  Administered 2014-08-05: 1000 mL via INTRAVENOUS

## 2014-08-05 MED ORDER — IOHEXOL 300 MG/ML  SOLN
25.0000 mL | Freq: Once | INTRAMUSCULAR | Status: AC | PRN
  Administered 2014-08-05: 25 mL via ORAL

## 2014-08-05 MED ORDER — POTASSIUM CHLORIDE IN NACL 20-0.9 MEQ/L-% IV SOLN
INTRAVENOUS | Status: DC
  Administered 2014-08-05: 17:00:00 via INTRAVENOUS
  Administered 2014-08-06: 75 mL/h via INTRAVENOUS
  Administered 2014-08-06 (×2): via INTRAVENOUS

## 2014-08-05 MED ORDER — PANTOPRAZOLE SODIUM 40 MG IV SOLR
40.0000 mg | INTRAVENOUS | Status: DC
  Administered 2014-08-05: 40 mg via INTRAVENOUS
  Filled 2014-08-05: qty 40

## 2014-08-05 MED ORDER — DIPHENHYDRAMINE HCL 50 MG/ML IJ SOLN
12.5000 mg | Freq: Four times a day (QID) | INTRAMUSCULAR | Status: DC | PRN
  Administered 2014-08-05: 12.5 mg via INTRAVENOUS
  Filled 2014-08-05: qty 1

## 2014-08-05 MED ORDER — SODIUM CHLORIDE 0.9 % IV SOLN: INTRAVENOUS | Status: DC

## 2014-08-05 MED ORDER — HYDROMORPHONE HCL 1 MG/ML IJ SOLN
1.0000 mg | INTRAMUSCULAR | Status: DC | PRN
  Administered 2014-08-05: 1 mg via INTRAVENOUS
  Filled 2014-08-05: qty 1

## 2014-08-05 MED ORDER — GABAPENTIN 300 MG PO CAPS
300.0000 mg | ORAL_CAPSULE | Freq: Two times a day (BID) | ORAL | Status: DC
  Administered 2014-08-05 – 2014-08-08 (×6): 300 mg via ORAL
  Filled 2014-08-05: qty 1
  Filled 2014-08-05: qty 3
  Filled 2014-08-05 (×4): qty 1

## 2014-08-05 MED ORDER — HYDROMORPHONE HCL 1 MG/ML IJ SOLN
1.0000 mg | Freq: Once | INTRAMUSCULAR | Status: AC
  Administered 2014-08-05: 1 mg via INTRAVENOUS
  Filled 2014-08-05: qty 1

## 2014-08-05 MED ORDER — ENOXAPARIN SODIUM 40 MG/0.4ML ~~LOC~~ SOLN
40.0000 mg | SUBCUTANEOUS | Status: DC
  Filled 2014-08-05 (×2): qty 0.4

## 2014-08-05 MED ORDER — CITALOPRAM HYDROBROMIDE 20 MG PO TABS
40.0000 mg | ORAL_TABLET | Freq: Every day | ORAL | Status: DC
  Administered 2014-08-05 – 2014-08-08 (×4): 40 mg via ORAL
  Filled 2014-08-05 (×4): qty 2

## 2014-08-05 MED ORDER — ONDANSETRON HCL 4 MG/2ML IJ SOLN
4.0000 mg | Freq: Once | INTRAMUSCULAR | Status: AC
  Administered 2014-08-05: 4 mg via INTRAVENOUS
  Filled 2014-08-05: qty 2

## 2014-08-05 MED ORDER — ONDANSETRON HCL 4 MG/2ML IJ SOLN: 4.0000 mg | Freq: Four times a day (QID) | INTRAMUSCULAR | Status: DC | PRN

## 2014-08-05 MED ORDER — ACETAMINOPHEN 650 MG RE SUPP: 650.0000 mg | Freq: Four times a day (QID) | RECTAL | Status: DC | PRN

## 2014-08-05 MED ORDER — IOHEXOL 300 MG/ML  SOLN
100.0000 mL | Freq: Once | INTRAMUSCULAR | Status: AC | PRN
  Administered 2014-08-05: 100 mL via INTRAVENOUS

## 2014-08-05 MED ORDER — SODIUM CHLORIDE 0.9 % IJ SOLN
3.0000 mL | Freq: Two times a day (BID) | INTRAMUSCULAR | Status: DC
  Administered 2014-08-06 – 2014-08-07 (×2): 3 mL via INTRAVENOUS

## 2014-08-05 MED ORDER — ACETAMINOPHEN 325 MG PO TABS
650.0000 mg | ORAL_TABLET | Freq: Four times a day (QID) | ORAL | Status: DC | PRN
  Administered 2014-08-05 – 2014-08-06 (×2): 650 mg via ORAL
  Filled 2014-08-05 (×2): qty 2

## 2014-08-05 NOTE — H&P (Signed)
[facility] History and Physical  Brianna [name] GYI:[id number] DOB: 1960/09/07 DOA: 08/05/2014  Referring physician: Trinidad Curet PCP: Lance Bosch, NP   Chief Complaint: abdominal pain   HPI: Brianna [name] is a 54 y.o. female   Pt is a 54 y/o female with a PMHx of HTN, GERD, diverticulitis (Sept/Oct 2015) , depression, anxiety, and arthritis, who presents to the ED for c/o abdominal pain that onset yesterday, but significantly worsened today at 6am after waking from rest. She reports nausea, but no vomiting, fever, diarrhea, diaphoresis, chest pain, or SOB. Normal BMs, last was yesterday. She was seen and treated in the ED for similar sxs yesterday and the sxs improved upon discharge with pain medication. Labs done in the ED yesrterday were unremarkable. However, she reports the pain to be different from yesterday as it was in the lower abdomen then, but now it is in the epigastric/upper region. The pain also radiates into the back and feels similar to one of her diverticulitis flares. She is currently followed by Dr. Gala Romney.    Former drinker, years ago. She denies taking hormonal supplements currently.    Review of Systems:  Constitutional:  No weight loss, night sweats, fevers, chills, fatigue.  HEENT:  No headaches, Difficulty swallowing,Tooth/dental problems,Sore throat,  No sneezing, itching, ear ache, nasal congestion, post nasal drip,  Cardio-vascular:  No chest pain, Orthopnea, PND, swelling in lower extremities, anasarca, dizziness, palpitations  GI:  No heartburn, indigestion, vomiting, diarrhea, change in bowel habits, loss of appetite. Positive abdominal pain and nausea.  Resp:  No shortness of breath with exertion or at rest. No excess mucus, no productive cough, No non-productive cough, No coughing up of blood.No change in color of mucus.No wheezing.No chest wall deformity  Skin:  no rash or lesions.  GU:  no dysuria, change in color of urine, no  urgency or frequency. No flank pain.  Musculoskeletal:  No joint pain or swelling. No decreased range of motion. No back pain.  Psych:  No change in mood or affect. No depression or anxiety. No memory loss.   Past Medical History  Diagnosis Date  . Depression   . Hypertension   . Anxiety   . GERD (gastroesophageal reflux disease)   . Headache   . Arthritis   . Chronic right hip pain since 09/2012   Past Surgical History  Procedure Laterality Date  . Cesarean section    . Oophorectomy    . Colonoscopy N/A 08/22/2013    [id number] diverticulosis paper hemtochezia from anorectal irritation  . Esophagogastroduodenoscopy N/A 08/22/2013    XHB:[id number] reflx esophagitis/noncritical schatzki's ring and hiatial hernia   Social History:  reports that she quit smoking about 7 years ago. Her smoking use included Cigarettes. She has a 1 pack-year smoking history. She has never used smokeless tobacco. She reports that she does not drink alcohol or use illicit drugs.  Allergies  Allergen Reactions  . Tylenol With Codeine #3 [Acetaminophen-Codeine] Other (See Comments)    headache    Family History  Problem Relation Age of Onset  . Colon cancer Neg Hx   . Ovarian cancer Mother     Prior to Admission medications   Medication Sig Start Date End Date Taking? Authorizing Provider  citalopram (CELEXA) 40 MG tablet Take 1 tablet (40 mg total) by mouth daily. 04/20/14  Yes Lance Bosch, NP  cyclobenzaprine (FLEXERIL) 10 MG tablet Take 1 tablet (10 mg total) by mouth 3 (three) times daily as needed  for muscle spasms. 06/07/14  Yes Deanna M Didiano, DO  dexlansoprazole (DEXILANT) 60 MG capsule Take 1 capsule (60 mg total) by mouth daily. 05/03/14  Yes Orvil Feil, NP  gabapentin (NEURONTIN) 300 MG capsule Take 1 capsule (300 mg total) by mouth 2 (two) times daily. 06/07/14  Yes Deanna M Didiano, DO  hydrochlorothiazide (HYDRODIURIL) 25 MG tablet Take 1 tablet (25 mg total) by mouth daily. 04/20/14  Yes  Lance Bosch, NP  lubiprostone (AMITIZA) 24 MCG capsule Take 1 capsule (24 mcg total) by mouth 2 (two) times daily with a meal. Patient taking differently: Take 24 mcg by mouth 2 (two) times daily as needed for constipation.  05/07/14  Yes Carlis Stable, NP  ondansetron (ZOFRAN ODT) 4 MG disintegrating tablet 4mg  ODT q4 hours prn nausea/vomit Patient not taking: Reported on 08/05/2014 08/04/14   Deno Etienne, DO  oxyCODONE (ROXICODONE) 5 MG immediate release tablet Take 1 tablet (5 mg total) by mouth every 4 (four) hours as needed for severe pain. Patient not taking: Reported on 08/05/2014 08/04/14   Deno Etienne, DO   Physical Exam: Filed Vitals:   08/05/14 1100 08/05/14 1130 08/05/14 1230 08/05/14 1330  BP: 96/68 90/62 94/63  96/68  Pulse: 68 67 68 65  Temp:      Resp: 18  18 16   Weight:      SpO2: 95% 92% 98% 95%    Wt Readings from Last 3 Encounters:  08/05/14 67.132 kg (148 lb)  08/04/14 67.132 kg (148 lb)  06/07/14 78.926 kg (174 lb)    General:  Appears calm and comfortable Eyes: PERRL, normal lids, irises & conjunctiva ENT: grossly normal hearing, lips & tongue Cardiovascular: RRR, no m/r/g. No LE edema. Respiratory: CTA bilaterally, no w/r/r. Normal respiratory effort. Abdomen: soft, non-distended, mild TTP to the diffusely. Negative Murphy's sign.  Skin: no rash or induration seen on limited exam Musculoskeletal: grossly normal tone BUE/BLE Psychiatric: grossly normal mood and affect, speech fluent and appropriate Neurologic: grossly non-focal.          Labs on Admission:  Basic Metabolic Panel:  Recent Labs Lab 08/04/14 1656 08/05/14 1013  NA 139 140  K 3.1* 3.3*  CL 101 101  CO2 29 30  GLUCOSE 113* 111*  BUN 17 9  CREATININE 0.93 0.72  CALCIUM 9.2 9.2   Liver Function Tests:  Recent Labs Lab 08/04/14 1656 08/05/14 1013  AST 126* 529*  ALT 60* 450*  ALKPHOS 96 138*  BILITOT 0.9 2.8*  PROT 6.7 7.2  ALBUMIN 4.0 4.0    Recent Labs Lab 08/04/14 1656  08/05/14 1013  LIPASE 12* 711*   No results for input(s): AMMONIA in the last 168 hours. CBC:  Recent Labs Lab 08/04/14 1656 08/05/14 1013  WBC 9.2 5.2  NEUTROABS 6.5 3.1  HGB 13.4 14.3  HCT 39.2 42.1  MCV 85.0 85.4  PLT 272 274   Cardiac Enzymes: No results for input(s): CKTOTAL, CKMB, CKMBINDEX, TROPONINI in the last 168 hours.  BNP (last 3 results) No results for input(s): BNP in the last 8760 hours.  ProBNP (last 3 results) No results for input(s): PROBNP in the last 8760 hours.  CBG: No results for input(s): GLUCAP in the last 168 hours.  Radiological Exams on Admission: Ct Abdomen Pelvis W Contrast  08/05/2014   CLINICAL DATA:  Worsening epigastric abdominal pain. History of diverticulitis.  EXAM: CT ABDOMEN AND PELVIS WITH CONTRAST  TECHNIQUE: Multidetector CT imaging of the abdomen and pelvis was performed  using the standard protocol following bolus administration of intravenous contrast.  CONTRAST:  116mL OMNIPAQUE IOHEXOL 300 MG/ML SOLN, 67mL OMNIPAQUE IOHEXOL 300 MG/ML SOLN  COMPARISON:  02/23/2013  FINDINGS: Normal hepatic contour. No discrete hepatic lesions. No radiopaque gallstones, however there is apparent hyperemia involving the gallbladder wall with potential minimal amount of pericholecystic fluid (representative axial image 25, series 2, coronal image 32, series 3). No definite intra or extrahepatic biliary duct dilatation though query minimal amount of peripancreatic stranding (representative axial image 25, series 2, coronal images 40 and 49, series 3). No discrete pancreatic mass or pancreatic ductal dilatation on this non pancreatic protocol CT. There is homogeneous enhancement of the pancreatic parenchyma.  There is symmetric enhancement and excretion of the bilateral kidneys. Incidental note is made of a right-sided extrarenal pelvis. No definite renal stones on this postcontrast examination. No urinary obstruction or perinephric stranding. No discrete renal  lesions.  Normal appearance of the bilateral adrenal glands and spleen.  Ingested enteric contrast extends to the level of the distal small bowel.  Colonic diverticulosis without evidence of diverticulitis. The bowel is otherwise normal in course and caliber without wall thickening or evidence of obstruction. Normal appearance of the retrocecal appendix. No pneumoperitoneum, pneumatosis or portal venous gas.  Normal caliber of the abdominal aorta. The major branch vessels of the abdominal aorta appear widely patent.  No bulky retroperitoneal, mesenteric, pelvic or inguinal lymphadenopathy.  Normal appearance of the pelvic organs. Normal appearance of the urinary bladder given degree distention.  There is a very small amount of fluid seen within the root of the mesocolon within the lower pelvis (image 67, series 2). No definable/drainable fluid collections.  Limited visualization of the lower thorax demonstrates minimal dependent subpleural ground-glass atelectasis. No discrete focal airspace opacities. No pleural effusion.  Normal heart size.  No pericardial effusion.  No acute or aggressive osseous abnormalities. Moderate severe multilevel lumbar spine DDD, worse at L3-L4 and L4-L5 with disc space height loss, endplate irregularity and small posteriorly directed disc osteophyte complexes at these locations.  Small mesenteric fat containing periumbilical hernia. Regional soft tissues appear otherwise normal.  IMPRESSION: 1. While no radiopaque gallstones are identified, there is apparent hyperemia involving the wall of the gallbladder with very minimal amount of pericholecystic fluid. Additionally, this finding is associated with a very minimal amount of ill-defined peripancreatic stranding suggestive acute uncomplicated pancreatitis. Clinical correlation is advised though further evaluation could be performed with right upper quadrant gallbladder ultrasound and the acquisition of amylase and lipase levels as  indicated. 2. Colonic diverticulosis without evidence of diverticulitis.   Electronically Signed   By: Sandi Mariscal M.D.   On: 08/05/2014 12:36    Assessment/Plan Active Problems:   Acute pancreatitis   Abdominal pain   Elevated LFTs   Hypokalemia   GERD (gastroesophageal reflux disease)   1. Acute Pancreatitis. Etiology not entirely clear although may suspect she has gallstones. She denies ETOH. CT A/P did not show any evidence of bilary duct dilation or residual gallstones. Will evaluate further with Abdominal US. Check lipid panel. Continue bowel rest with NPO status. Pain management with IV opiates. Aggressive hydration with IVF. Repeat labs in the morning. Will request GI consult.  2. Abdominal pain. Related to #1. Continue pain mangement. 3. Elevated LFTs. Suspect reactive to pancreatitis. Although transient biliary obstruction not excluded. Further imagining with abdomen US. Check a hepatitis panel. Repeat labs in the morning.  4. Hypokalemia. Potassium 3.3 on admission. Potassium repletion.  5. GERD. Continue  PPI.       Code Status: Full DVT Prophylaxis: Lovenox  Family Communication: discussed plan of care with pt, she understands the plan and agrees to admission at this time.  Disposition Plan: home once improved and anticipate in 2-3 days  Time spent: 55 minutes   Kathie Dike, M.D.  [facility] Pager [phone]  I, Norg'e Tisdol, acting a scribe, recorded this note contemporaneously in the presence of Dr. Kathie Dike, M.D. on 08/05/2014 at 1:59 PM    Attending:  I have reviewed the above documentation for accuracy and completeness, and I agree with the above.  MEMON,JEHANZEB

## 2014-08-05 NOTE — ED Notes (Signed)
MD Lockwood at bedside.  

## 2014-08-05 NOTE — ED Notes (Signed)
Pt is to continue being NPO at this time.

## 2014-08-05 NOTE — ED Notes (Signed)
PA Lily Kocher at bedside updating patient and family.

## 2014-08-05 NOTE — Progress Notes (Signed)
ED/CM noted patient did not have health insurance and/or PCP listed in the computer.  Patient was given the [facility] resources handout with information on the clinics, food pantries, and the handout for new health insurance sign-up. Provided drug discount card. Patient expressed appreciation for information received.  Provided financial counselor's contact number.

## 2014-08-05 NOTE — ED Notes (Signed)
Hospitalist at bedside 

## 2014-08-05 NOTE — ED Provider Notes (Signed)
CSN: [id number]     Arrival date & time 08/05/14  6010 History  This chart was scribed for Carmin Muskrat, MD by Starleen Arms, ED Scribe. This patient was seen in room APA15/APA15 and the patient's care was started at 9:39 AM.   Chief Complaint  Patient presents with  . Abdominal Pain   The history is provided by the patient. No language interpreter was used.   HPI Comments: Brianna [name] is a 54 y.o. female who presents to the Emergency Department complaining of worsening epigastric pain onset yesterday.  She was seen yesterday in the ED for the same yeseterday and discharged home after unremarkable labs and improvement with pain medication and anti-emetic.  The patient reports the pain began to worsen again this morning at 6:00 am.  She has a history of diverticulitis, followed by Dr. Sydell Axon, which intermittently flares - this episode feels like a typical flare.  No bowel movements since yesterday.    There is no history of abdominal surgery or pancreatitis.  That patient does not drink alcohol or use tobacco.  She denies vomiting, diarrhea, constipation, fever, chills, constipation (last BM was yesterday).   GI: Dr. Sydell Axon Past Medical History  Diagnosis Date  . Depression   . Hypertension   . Anxiety   . GERD (gastroesophageal reflux disease)   . Headache   . Arthritis   . Chronic right hip pain since 09/2012   Past Surgical History  Procedure Laterality Date  . Cesarean section    . Oophorectomy    . Colonoscopy N/A 08/22/2013    [id number] diverticulosis paper hemtochezia from anorectal irritation  . Esophagogastroduodenoscopy N/A 08/22/2013    GUR:[id number] reflx esophagitis/noncritical schatzki's ring and hiatial hernia   Family History  Problem Relation Age of Onset  . Colon cancer Neg Hx   . Ovarian cancer Mother    History  Substance Use Topics  . Smoking status: Former Smoker -- 0.50 packs/day for 2 years    Types: Cigarettes    Quit date: 01/06/2007  .  Smokeless tobacco: Never Used  . Alcohol Use: No   OB History    Gravida Para Term Preterm AB TAB SAB Ectopic Multiple Living   4 4 4       4      Review of Systems  Constitutional:       Per HPI, otherwise negative  HENT:       Per HPI, otherwise negative  Respiratory:       Per HPI, otherwise negative  Cardiovascular:       Per HPI, otherwise negative  Endocrine:       Negative aside from HPI  Genitourinary:       Neg aside from HPI   Musculoskeletal:       Per HPI, otherwise negative  Skin: Negative.   Neurological: Negative for syncope.      Allergies  Tylenol with codeine #3  Home Medications   Prior to Admission medications   Medication Sig Start Date End Date Taking? Authorizing Provider  citalopram (CELEXA) 40 MG tablet Take 1 tablet (40 mg total) by mouth daily. 04/20/14  Yes Lance Bosch, NP  cyclobenzaprine (FLEXERIL) 10 MG tablet Take 1 tablet (10 mg total) by mouth 3 (three) times daily as needed for muscle spasms. 06/07/14  Yes Deanna M Didiano, DO  dexlansoprazole (DEXILANT) 60 MG capsule Take 1 capsule (60 mg total) by mouth daily. 05/03/14  Yes Orvil Feil, NP  gabapentin (NEURONTIN) 300 MG  capsule Take 1 capsule (300 mg total) by mouth 2 (two) times daily. 06/07/14  Yes Deanna M Didiano, DO  hydrochlorothiazide (HYDRODIURIL) 25 MG tablet Take 1 tablet (25 mg total) by mouth daily. 04/20/14  Yes Lance Bosch, NP  lubiprostone (AMITIZA) 24 MCG capsule Take 1 capsule (24 mcg total) by mouth 2 (two) times daily with a meal. Patient taking differently: Take 24 mcg by mouth 2 (two) times daily as needed for constipation.  05/07/14  Yes Carlis Stable, NP  ondansetron (ZOFRAN ODT) 4 MG disintegrating tablet 4mg  ODT q4 hours prn nausea/vomit Patient not taking: Reported on 08/05/2014 08/04/14   Deno Etienne, DO  oxyCODONE (ROXICODONE) 5 MG immediate release tablet Take 1 tablet (5 mg total) by mouth every 4 (four) hours as needed for severe pain. Patient not taking: Reported  on 08/05/2014 08/04/14   Deno Etienne, DO   BP 95/58 mmHg  Pulse 67  Temp(Src) 98.7 F (37.1 C)  Resp 18  Wt 148 lb (67.132 kg)  SpO2 98% Physical Exam  Constitutional: She is oriented to person, place, and time. She appears well-developed and well-nourished. No distress.  HENT:  Head: Normocephalic and atraumatic.  Eyes: Conjunctivae and EOM are normal.  Cardiovascular: Normal rate and regular rhythm.   Pulmonary/Chest: Effort normal and breath sounds normal. No stridor. No respiratory distress.  Abdominal: She exhibits no distension. There is tenderness.  Diffusely tender superiorly less so inferiorarly .  No murphy's sign.    Musculoskeletal: She exhibits no edema.  Neurological: She is alert and oriented to person, place, and time. No cranial nerve deficit.  Skin: Skin is warm and dry.  Psychiatric: She has a normal mood and affect.  Nursing note and vitals reviewed.   ED Course  Procedures (including critical care time)   COORDINATION OF CARE:  9:50 AM Discussed treatment plan with patient at bedside.  Patient acknowledges and agrees with plan.    Labs Review Labs Reviewed  COMPREHENSIVE METABOLIC PANEL - Abnormal; Notable for the following:    Potassium 3.3 (*)    Glucose, Bld 111 (*)    AST 529 (*)    ALT 450 (*)    Alkaline Phosphatase 138 (*)    Total Bilirubin 2.8 (*)    All other components within normal limits  LIPASE, BLOOD - Abnormal; Notable for the following:    Lipase 711 (*)    All other components within normal limits  CBC WITH DIFFERENTIAL/PLATELET    Imaging Review Ct Abdomen Pelvis W Contrast  08/05/2014   CLINICAL DATA:  Worsening epigastric abdominal pain. History of diverticulitis.  EXAM: CT ABDOMEN AND PELVIS WITH CONTRAST  TECHNIQUE: Multidetector CT imaging of the abdomen and pelvis was performed using the standard protocol following bolus administration of intravenous contrast.  CONTRAST:  145mL OMNIPAQUE IOHEXOL 300 MG/ML SOLN, 90mL  OMNIPAQUE IOHEXOL 300 MG/ML SOLN  COMPARISON:  02/23/2013  FINDINGS: Normal hepatic contour. No discrete hepatic lesions. No radiopaque gallstones, however there is apparent hyperemia involving the gallbladder wall with potential minimal amount of pericholecystic fluid (representative axial image 25, series 2, coronal image 32, series 3). No definite intra or extrahepatic biliary duct dilatation though query minimal amount of peripancreatic stranding (representative axial image 25, series 2, coronal images 40 and 49, series 3). No discrete pancreatic mass or pancreatic ductal dilatation on this non pancreatic protocol CT. There is homogeneous enhancement of the pancreatic parenchyma.  There is symmetric enhancement and excretion of the bilateral kidneys. Incidental note is  made of a right-sided extrarenal pelvis. No definite renal stones on this postcontrast examination. No urinary obstruction or perinephric stranding. No discrete renal lesions.  Normal appearance of the bilateral adrenal glands and spleen.  Ingested enteric contrast extends to the level of the distal small bowel.  Colonic diverticulosis without evidence of diverticulitis. The bowel is otherwise normal in course and caliber without wall thickening or evidence of obstruction. Normal appearance of the retrocecal appendix. No pneumoperitoneum, pneumatosis or portal venous gas.  Normal caliber of the abdominal aorta. The major branch vessels of the abdominal aorta appear widely patent.  No bulky retroperitoneal, mesenteric, pelvic or inguinal lymphadenopathy.  Normal appearance of the pelvic organs. Normal appearance of the urinary bladder given degree distention.  There is a very small amount of fluid seen within the root of the mesocolon within the lower pelvis (image 67, series 2). No definable/drainable fluid collections.  Limited visualization of the lower thorax demonstrates minimal dependent subpleural ground-glass atelectasis. No discrete focal  airspace opacities. No pleural effusion.  Normal heart size.  No pericardial effusion.  No acute or aggressive osseous abnormalities. Moderate severe multilevel lumbar spine DDD, worse at L3-L4 and L4-L5 with disc space height loss, endplate irregularity and small posteriorly directed disc osteophyte complexes at these locations.  Small mesenteric fat containing periumbilical hernia. Regional soft tissues appear otherwise normal.  IMPRESSION: 1. While no radiopaque gallstones are identified, there is apparent hyperemia involving the wall of the gallbladder with very minimal amount of pericholecystic fluid. Additionally, this finding is associated with a very minimal amount of ill-defined peripancreatic stranding suggestive acute uncomplicated pancreatitis. Clinical correlation is advised though further evaluation could be performed with right upper quadrant gallbladder ultrasound and the acquisition of amylase and lipase levels as indicated. 2. Colonic diverticulosis without evidence of diverticulitis.   Electronically Signed   By: Sandi Mariscal M.D.   On: 08/05/2014 12:36     MDM   Final diagnoses:  Acute pancreatitis, unspecified pancreatitis type   Patient presents one day after being evaluated here for abdominal pain, now with persistency of pain. Notable, the patient had a normal lipase yesterday, though she had mildly elevated LFT. Today, the 24 hour letter, the patient has lipase of greater than 700, substantially elevated LFT. CT scan demonstrates pancreatitis, some pericholecystic fluid. Patient had decent pain control, received fluid resuscitation, was admitted for further evaluation and management.   I personally performed the services described in this documentation, which was scribed in my presence. The recorded information has been reviewed and is accurate.    Carmin Muskrat, MD 08/05/14 1425

## 2014-08-05 NOTE — ED Notes (Signed)
Attempted to give report x1. RN states she will call back shortly.

## 2014-08-05 NOTE — ED Notes (Signed)
MD Vanita Panda at bedside updating patient.

## 2014-08-05 NOTE — ED Notes (Signed)
Pt given oral contrast to drink.

## 2014-08-05 NOTE — ED Notes (Signed)
Pt was seen in ED yesterday for same abd pain states she was given rx but can't get it filled until later today. Pt reports pain in upper abd that radiates to back. Pt was dx with diverticulitis and was told to f/u with her primary care for eval of her gallbladder.

## 2014-08-06 ENCOUNTER — Inpatient Hospital Stay (HOSPITAL_COMMUNITY): Payer: Medicaid Other

## 2014-08-06 ENCOUNTER — Encounter (HOSPITAL_COMMUNITY): Payer: Self-pay | Admitting: Gastroenterology

## 2014-08-06 DIAGNOSIS — R7989 Other specified abnormal findings of blood chemistry: Secondary | ICD-10-CM

## 2014-08-06 DIAGNOSIS — K859 Acute pancreatitis, unspecified: Principal | ICD-10-CM

## 2014-08-06 LAB — COMPREHENSIVE METABOLIC PANEL
ALT: 272 U/L — ABNORMAL HIGH (ref 14–54)
AST: 206 U/L — ABNORMAL HIGH (ref 15–41)
Albumin: 3 g/dL — ABNORMAL LOW (ref 3.5–5.0)
Alkaline Phosphatase: 114 U/L (ref 38–126)
Anion gap: 5 (ref 5–15)
BUN: 5 mg/dL — AB (ref 6–20)
CALCIUM: 8.3 mg/dL — AB (ref 8.9–10.3)
CO2: 26 mmol/L (ref 22–32)
CREATININE: 0.62 mg/dL (ref 0.44–1.00)
Chloride: 109 mmol/L (ref 101–111)
GFR calc non Af Amer: >60 mL/min (ref >60)
GLUCOSE: 96 mg/dL (ref 65–99)
Potassium: 3.6 mmol/L (ref 3.5–5.1)
Sodium: 140 mmol/L (ref 135–145)
Total Bilirubin: 1.8 mg/dL — ABNORMAL HIGH (ref 0.3–1.2)
Total Protein: 5.5 g/dL — ABNORMAL LOW (ref 6.5–8.1)

## 2014-08-06 LAB — CBC
HCT: 35 % — ABNORMAL LOW (ref 36.0–46.0)
Hemoglobin: 11.5 g/dL — ABNORMAL LOW (ref 12.0–15.0)
MCH: 28.5 pg (ref 26.0–34.0)
MCHC: 32.9 g/dL (ref 30.0–36.0)
MCV: 86.8 fL (ref 78.0–100.0)
PLATELETS: 198 10*3/uL (ref 150–400)
RBC: 4.03 MIL/uL (ref 3.87–5.11)
RDW: 13.1 % (ref 11.5–15.5)
WBC: 5.2 10*3/uL (ref 4.0–10.5)

## 2014-08-06 LAB — LIPID PANEL
Cholesterol: 134 mg/dL (ref 0–200)
HDL: 52 mg/dL (ref >40)
LDL CALC: 68 mg/dL (ref 0–99)
Total CHOL/HDL Ratio: 2.6 RATIO
Triglycerides: 71 mg/dL (ref <150)
VLDL: 14 mg/dL (ref 0–40)

## 2014-08-06 LAB — LIPASE, BLOOD: LIPASE: 45 U/L (ref 22–51)

## 2014-08-06 MED ORDER — PANTOPRAZOLE SODIUM 40 MG PO TBEC
40.0000 mg | DELAYED_RELEASE_TABLET | Freq: Every day | ORAL | Status: DC
  Administered 2014-08-06 – 2014-08-07 (×2): 40 mg via ORAL
  Filled 2014-08-06 (×2): qty 1

## 2014-08-06 NOTE — Consult Note (Signed)
Referring Provider: Dr. Roderic Palau  Primary Care Physician:  Lance Bosch, NP Primary Gastroenterologist:  Dr. Gala Romney   Date of Admission: 08/05/14 Date of Consultation: 08/06/14  Reason for Consultation:  Acute pancreatitis   HPI:  Brianna [name] is a 54 y.o. year old female admitted with acute pancreatitis. States she has had intermittent epigastric pain in the past, resolving on own over the past year, but this time was severe. Epigastric pain radiates across upper abdomen and straight through to back. Associated nausea but no vomiting. This morning had some loose stool. Prior to this no BM since Saturday. Pain waxing and waning. Came to the ED on Saturday but sent home. Was unable to complete ultrasound as she states it was not available per her report. Went back to ED yesterday morning due to severe pain.   Lipase 711 on admission, now normal this morning at 45. AST 126, ALT 60 on admission, with significant bump in LFTs as noted yesterday with AST 529, ALT 450, Alk Phos 138, Tbili 2.8. As of this morning, LFTs trending back down. US abdomen ordered. Denies ETOH use.   Past Medical History  Diagnosis Date  . Depression   . Hypertension   . Anxiety   . GERD (gastroesophageal reflux disease)   . Headache   . Arthritis   . Chronic right hip pain since 09/2012    Past Surgical History  Procedure Laterality Date  . Cesarean section    . Oophorectomy    . Colonoscopy N/A 08/22/2013    [id number] diverticulosis paper hemtochezia from anorectal irritation  . Esophagogastroduodenoscopy N/A 08/22/2013    UJW:[id number] reflx esophagitis/noncritical schatzki's ring and hiatial hernia    Prior to Admission medications   Medication Sig Start Date End Date Taking? Authorizing Provider  citalopram (CELEXA) 40 MG tablet Take 1 tablet (40 mg total) by mouth daily. 04/20/14  Yes Lance Bosch, NP  cyclobenzaprine (FLEXERIL) 10 MG tablet Take 1 tablet (10 mg total) by mouth 3 (three) times daily as  needed for muscle spasms. 06/07/14  Yes Deanna M Didiano, DO  dexlansoprazole (DEXILANT) 60 MG capsule Take 1 capsule (60 mg total) by mouth daily. 05/03/14  Yes Orvil Feil, NP  gabapentin (NEURONTIN) 300 MG capsule Take 1 capsule (300 mg total) by mouth 2 (two) times daily. 06/07/14  Yes Deanna M Didiano, DO  hydrochlorothiazide (HYDRODIURIL) 25 MG tablet Take 1 tablet (25 mg total) by mouth daily. 04/20/14  Yes Lance Bosch, NP  lubiprostone (AMITIZA) 24 MCG capsule Take 1 capsule (24 mcg total) by mouth 2 (two) times daily with a meal. Patient taking differently: Take 24 mcg by mouth 2 (two) times daily as needed for constipation.  05/07/14  Yes Carlis Stable, NP    Current Facility-Administered Medications  Medication Dose Route Frequency Provider Last Rate Last Dose  . 0.9 % NaCl with KCl 20 mEq/ L  infusion   Intravenous Continuous Kathie Dike, MD 125 mL/hr at 08/06/14 0030    . acetaminophen (TYLENOL) tablet 650 mg  650 mg Oral Q6H PRN Kathie Dike, MD   650 mg at 08/05/14 1643   Or  . acetaminophen (TYLENOL) suppository 650 mg  650 mg Rectal Q6H PRN Kathie Dike, MD      . citalopram (CELEXA) tablet 40 mg  40 mg Oral Daily Kathie Dike, MD   40 mg at 08/05/14 1643  . diphenhydrAMINE (BENADRYL) injection 12.5 mg  12.5 mg Intravenous Q6H PRN Dianne Dun, NP  12.5 mg at 08/05/14 2041  . enoxaparin (LOVENOX) injection 40 mg  40 mg Subcutaneous Q24H Kathie Dike, MD   40 mg at 08/05/14 1850  . gabapentin (NEURONTIN) capsule 300 mg  300 mg Oral BID Kathie Dike, MD   300 mg at 08/05/14 2202  . HYDROmorphone (DILAUDID) injection 1 mg  1 mg Intravenous Q4H PRN Kathie Dike, MD   1 mg at 08/05/14 1850  . ondansetron (ZOFRAN) tablet 4 mg  4 mg Oral Q6H PRN Kathie Dike, MD       Or  . ondansetron (ZOFRAN) injection 4 mg  4 mg Intravenous Q6H PRN Kathie Dike, MD      . pantoprazole (PROTONIX) injection 40 mg  40 mg Intravenous Q24H Kathie Dike, MD   40 mg at 08/05/14  1642  . sodium chloride 0.9 % injection 3 mL  3 mL Intravenous Q12H Kathie Dike, MD   3 mL at 08/05/14 2200    Allergies as of 08/05/2014 - Review Complete 08/05/2014  Allergen Reaction Noted  . Tylenol with codeine #3 [acetaminophen-codeine] Other (See Comments) 05/24/2014    Family History  Problem Relation Age of Onset  . Colon cancer Neg Hx   . Ovarian cancer Mother     History   Social History  . Marital Status: Widowed    Spouse Name: N/A  . Number of Children: N/A  . Years of Education: N/A   Occupational History  .      hasn't worked since October 2014. Filing for disability   Social History Main Topics  . Smoking status: Former Smoker -- 0.50 packs/day for 2 years    Types: Cigarettes    Quit date: 01/06/2007  . Smokeless tobacco: Never Used  . Alcohol Use: No  . Drug Use: No  . Sexual Activity: Not on file   Other Topics Concern  . Not on file   Social History Narrative    Review of Systems: Gen: +chills  CV: Denies chest pain, heart palpitations, syncope, edema  Resp: +SOB  GI: see HPI GU : Denies urinary burning, urinary frequency, urinary incontinence.  MS: chronic nerve pain (pinched nerves cervical region, affects right arm) chronic right shoulder pain, hip pain  Derm: Denies rash, itching, dry skin Psych: Denies depression, anxiety,confusion, or memory loss Heme: Denies bruising, bleeding, and enlarged lymph nodes.  Physical Exam: Vital signs in last 24 hours: Temp:  [98.4 F (36.9 C)-98.7 F (37.1 C)] 98.6 F (37 C) (08/01 0531) Pulse Rate:  [62-87] 73 (08/01 0531) Resp:  [16-20] 18 (08/01 0531) BP: (90-120)/(58-83) 102/62 mmHg (08/01 0531) SpO2:  [91 %-99 %] 91 % (08/01 0531) Weight:  [148 lb (67.132 kg)-160 lb 1.6 oz (72.621 kg)] 160 lb 1.6 oz (72.621 kg) (07/31 1532) Last BM Date: 08/04/14 General:   Alert,  Well-developed, well-nourished, pleasant and cooperative in NAD Head:  Normocephalic and atraumatic. Eyes:  Sclera clear,  no icterus.   Conjunctiva pink. Ears:  Normal auditory acuity. Nose:  No deformity, discharge,  or lesions. Mouth:  No deformity or lesions, dentition normal. Lungs:  Clear throughout to auscultation.   No wheezes, crackles, or rhonchi. No acute distress. Heart:  Regular rate and rhythm; no murmurs, clicks, rubs,  or gallops. Abdomen:  Soft, mild "soreness" to palpation epigastric/RUQ and nondistended. No masses, hepatosplenomegaly or hernias noted. Normal bowel sounds, without guarding, and without rebound.   Rectal:  Deferred  Msk:  Symmetrical without gross deformities. Normal posture. Extremities:  Without  edema. Neurologic:  Alert and  oriented x4;  grossly normal neurologically. Skin:  Intact without significant lesions or rashes. Psych:  Alert and cooperative. Normal mood and affect.  Intake/Output from previous day: 07/31 0701 - 08/01 0700 In: 3662.5 [I.V.:3662.5] Out: -  Intake/Output this shift:    Lab Results:  Recent Labs  08/04/14 1656 08/05/14 1013 08/06/14 0626  WBC 9.2 5.2 5.2  HGB 13.4 14.3 11.5*  HCT 39.2 42.1 35.0*  PLT 272 274 198   BMET  Recent Labs  08/04/14 1656 08/05/14 1013 08/06/14 0626  NA 139 140 140  K 3.1* 3.3* 3.6  CL 101 101 109  CO2 29 30 26   GLUCOSE 113* 111* 96  BUN 17 9 5*  CREATININE 0.93 0.72 0.62  CALCIUM 9.2 9.2 8.3*   LFT  Recent Labs  08/04/14 1656 08/05/14 1013 08/06/14 0626  PROT 6.7 7.2 5.5*  ALBUMIN 4.0 4.0 3.0*  AST 126* 529* 206*  ALT 60* 450* 272*  ALKPHOS 96 138* 114  BILITOT 0.9 2.8* 1.8*   Lab Results  Component Value Date   LIPASE 45 08/06/2014    Studies/Results: Ct Abdomen Pelvis W Contrast  08/05/2014   CLINICAL DATA:  Worsening epigastric abdominal pain. History of diverticulitis.  EXAM: CT ABDOMEN AND PELVIS WITH CONTRAST  TECHNIQUE: Multidetector CT imaging of the abdomen and pelvis was performed using the standard protocol following bolus administration of intravenous contrast.   CONTRAST:  153m OMNIPAQUE IOHEXOL 300 MG/ML SOLN, 225mOMNIPAQUE IOHEXOL 300 MG/ML SOLN  COMPARISON:  02/23/2013  FINDINGS: Normal hepatic contour. No discrete hepatic lesions. No radiopaque gallstones, however there is apparent hyperemia involving the gallbladder wall with potential minimal amount of pericholecystic fluid (representative axial image 25, series 2, coronal image 32, series 3). No definite intra or extrahepatic biliary duct dilatation though query minimal amount of peripancreatic stranding (representative axial image 25, series 2, coronal images 40 and 49, series 3). No discrete pancreatic mass or pancreatic ductal dilatation on this non pancreatic protocol CT. There is homogeneous enhancement of the pancreatic parenchyma.  There is symmetric enhancement and excretion of the bilateral kidneys. Incidental note is made of a right-sided extrarenal pelvis. No definite renal stones on this postcontrast examination. No urinary obstruction or perinephric stranding. No discrete renal lesions.  Normal appearance of the bilateral adrenal glands and spleen.  Ingested enteric contrast extends to the level of the distal small bowel.  Colonic diverticulosis without evidence of diverticulitis. The bowel is otherwise normal in course and caliber without wall thickening or evidence of obstruction. Normal appearance of the retrocecal appendix. No pneumoperitoneum, pneumatosis or portal venous gas.  Normal caliber of the abdominal aorta. The major branch vessels of the abdominal aorta appear widely patent.  No bulky retroperitoneal, mesenteric, pelvic or inguinal lymphadenopathy.  Normal appearance of the pelvic organs. Normal appearance of the urinary bladder given degree distention.  There is a very small amount of fluid seen within the root of the mesocolon within the lower pelvis (image 67, series 2). No definable/drainable fluid collections.  Limited visualization of the lower thorax demonstrates minimal dependent  subpleural ground-glass atelectasis. No discrete focal airspace opacities. No pleural effusion.  Normal heart size.  No pericardial effusion.  No acute or aggressive osseous abnormalities. Moderate severe multilevel lumbar spine DDD, worse at L3-L4 and L4-L5 with disc space height loss, endplate irregularity and small posteriorly directed disc osteophyte complexes at these locations.  Small mesenteric fat containing periumbilical hernia. Regional soft tissues appear otherwise normal.  IMPRESSION: 1.  While no radiopaque gallstones are identified, there is apparent hyperemia involving the wall of the gallbladder with very minimal amount of pericholecystic fluid. Additionally, this finding is associated with a very minimal amount of ill-defined peripancreatic stranding suggestive acute uncomplicated pancreatitis. Clinical correlation is advised though further evaluation could be performed with right upper quadrant gallbladder ultrasound and the acquisition of amylase and lipase levels as indicated. 2. Colonic diverticulosis without evidence of diverticulitis.   Electronically Signed   By: Sandi Mariscal M.D.   On: 08/05/2014 12:36    Impression: 54 year old female admitted with acute, uncomplicated pancreatitis and bump in LFTs, now trending down. Strongly suspect biliary etiology with pattern of LFTs, question microlithiasis. US abdomen now ordered for further assessment. Lipase has now normalized and overall pain with slight improvement since admission. Await ultrasound findings, with further recommendations to follow. Anticipate general surgery consultation will be needed.   Plan: Remain NPO Supportive measures  US abdomen today as ordered Will follow-up on results as they become available As patient is on a PPI at home for erosive esophagitis, agree with Protonix once daily Anticipate general surgery consult pending findings from ultrasound  Orvil Feil, ANP-BC [facility]enter[facility]     LOS: 1  day    08/06/2014, 8:33 AM

## 2014-08-06 NOTE — Care Management Note (Signed)
Case Management Note  Patient Details  Name: Brianna [name] MRN: [id number] Date of Birth: [birth year]  Subjective/Objective:                  Pt admitted from home with pancreatitis. Pt lives alone and will return home at discharge. Pt is independent with ADL's. Pts PCP is with the [facility] in [place].  Action/Plan: Anticipate discharge within 24 hours. No Cm needs noted.  Expected Discharge Date:                  Expected Discharge Plan:  Home/Self Care  In-House Referral:  Financial Counselor  Discharge planning Services  CM Consult  Post Acute Care Choice:  NA Choice offered to:  NA  DME Arranged:    DME Agency:     HH Arranged:    HH Agency:     Status of Service:  In process, will continue to follow  Medicare Important Message Given:    Date Medicare IM Given:    Medicare IM give by:    Date Additional Medicare IM Given:    Additional Medicare Important Message give by:     If discussed at [place] of Stay Meetings, dates discussed:    Additional Comments:  Joylene Draft, RN 08/06/2014, 10:31 AM

## 2014-08-06 NOTE — Progress Notes (Addendum)
[facility] PROGRESS NOTE  Brianna [name] DGU:[id number] DOB: [birth year] DOA: 08/05/2014 PCP: Ambrose Finland, NP  Assessment/Plan: 1. Acute Pancreatitis. Improving with aggressive IVF treatment,pain management and bowel rest, lipase now 45. Etiology not entirely clear although may suspect she may have passed a gallstone. She denies ETOH. CT A/P did not show any evidence of bilary duct dilation or residual gallstones. Abdominal ultrasound did not show any evidence of gallstones. Check lipid panel. Will continue bowel rest with NPO status. Pain management with IV opiates.GI recommends keeping the patient NPO, continuing supportive measures. Case discussed with Dr. Derrell Lolling on-call for general surgery. He has requested further input from gastroenterology regarding further workup/etiology of pancreatitis before surgical consultation.   2. Abdominal pain. Related to acute pancreatitis. Will continue pain mangement. 3. Elevated LFTs. Suspect reactive to pancreatitis vs. transient biliary obstruction. Further imagining with abdomen US. Check a hepatitis panel.  4. Hypokalemia. Resolved after potassium repletion.  Potassium 3.6 today 5. GERD. Continue PPI.   Code Status: Full DVT Prophylaxis: Lovenox Family Communication: spoke with pt, she understands the plan and don't have any questions at this time.8/1   Disposition Plan: home once symptoms have improved   Consultants:  GI   Procedures:    Antibiotics:    HPI/Subjective: Patient states she is feeling better than yesterday. She says she doesn't think she needs pain medication. No reports of nausea, abdominal pain or vomiting but one episode of diarrhea this morning. No SOB  Objective: Filed Vitals:   08/06/14 0531  BP: 102/62  Pulse: 73  Temp: 98.6 F (37 C)  Resp: 18    Intake/Output Summary (Last 24 hours) at 08/06/14 0723 Last data filed at 08/06/14 0600  Gross per 24 hour  Intake 3662.5 ml  Output      0 ml   Net 3662.5 ml   Filed Weights   08/05/14 0933 08/05/14 1532  Weight: 67.132 kg (148 lb) 72.621 kg (160 lb 1.6 oz)    Exam:   General:  NAD, appears comfortable and calm, VSS  Cardiovascular: Regular rate and rhythm , s1 ,s2, no m/r/g  Respiratory: clear to auscultation bilaterally, no wheezing, rales, or rhonchi   Abdomen: soft, mild tenderness midline, bowel sounds present  Musculoskeletal: no LE edema  Data Reviewed: Basic Metabolic Panel:  Recent Labs Lab 08/04/14 1656 08/05/14 1013 08/06/14 0626  NA 139 140 140  K 3.1* 3.3* 3.6  CL 101 101 109  CO2 29 30 26   GLUCOSE 113* 111* 96  BUN 17 9 5*  CREATININE 0.93 0.72 0.62  CALCIUM 9.2 9.2 8.3*   Liver Function Tests:  Recent Labs Lab 08/04/14 1656 08/05/14 1013 08/06/14 0626  AST 126* 529* 206*  ALT 60* 450* 272*  ALKPHOS 96 138* 114  BILITOT 0.9 2.8* 1.8*  PROT 6.7 7.2 5.5*  ALBUMIN 4.0 4.0 3.0*    Recent Labs Lab 08/04/14 1656 08/05/14 1013 08/06/14 0626  LIPASE 12* 711* 45   No results for input(s): AMMONIA in the last 168 hours. CBC:  Recent Labs Lab 08/04/14 1656 08/05/14 1013 08/06/14 0626  WBC 9.2 5.2 5.2  NEUTROABS 6.5 3.1  --   HGB 13.4 14.3 11.5*  HCT 39.2 42.1 35.0*  MCV 85.0 85.4 86.8  PLT 272 274 198   Cardiac Enzymes: No results for input(s): CKTOTAL, CKMB, CKMBINDEX, TROPONINI in the last 168 hours. BNP (last 3 results) No results for input(s): BNP in the last 8760 hours.  ProBNP (last 3 results) No results  for input(s): PROBNP in the last 8760 hours.  CBG: No results for input(s): GLUCAP in the last 168 hours.  No results found for this or any previous visit (from the past 240 hour(s)).   Studies: Ct Abdomen Pelvis W Contrast  08/05/2014   CLINICAL DATA:  Worsening epigastric abdominal pain. History of diverticulitis.  EXAM: CT ABDOMEN AND PELVIS WITH CONTRAST  TECHNIQUE: Multidetector CT imaging of the abdomen and pelvis was performed using the standard  protocol following bolus administration of intravenous contrast.  CONTRAST:  OMNIPAQUE IOHEXOL 300 MG/ML SOLN, 25mL OMNIPAQUE IOHEXOL 300 MG/ML SOLN  COMPARISON:  02/23/2013  FINDINGS: Normal hepatic contour. No discrete hepatic lesions. No radiopaque gallstones, however there is apparent hyperemia involving the gallbladder wall with potential minimal amount of pericholecystic fluid (representative axial image 25, series 2, coronal image 32, series 3). No definite intra or extrahepatic biliary duct dilatation though query minimal amount of peripancreatic stranding (representative axial image 25, series 2, coronal images 40 and 49, series 3). No discrete pancreatic mass or pancreatic ductal dilatation on this non pancreatic protocol CT. There is homogeneous enhancement of the pancreatic parenchyma.  There is symmetric enhancement and excretion of the bilateral kidneys. Incidental note is made of a right-sided extrarenal pelvis. No definite renal stones on this postcontrast examination. No urinary obstruction or perinephric stranding. No discrete renal lesions.  Normal appearance of the bilateral adrenal glands and spleen.  Ingested enteric contrast extends to the level of the distal small bowel.  Colonic diverticulosis without evidence of diverticulitis. The bowel is otherwise normal in course and caliber without wall thickening or evidence of obstruction. Normal appearance of the retrocecal appendix. No pneumoperitoneum, pneumatosis or portal venous gas.  Normal caliber of the abdominal aorta. The major branch vessels of the abdominal aorta appear widely patent.  No bulky retroperitoneal, mesenteric, pelvic or inguinal lymphadenopathy.  Normal appearance of the pelvic organs. Normal appearance of the urinary bladder given degree distention.  There is a very small amount of fluid seen within the root of the mesocolon within the lower pelvis (image 67, series 2). No definable/drainable fluid collections.   Limited visualization of the lower thorax demonstrates minimal dependent subpleural ground-glass atelectasis. No discrete focal airspace opacities. No pleural effusion.  Normal heart size.  No pericardial effusion.  No acute or aggressive osseous abnormalities. Moderate severe multilevel lumbar spine DDD, worse at L3-L4 and L4-L5 with disc space height loss, endplate irregularity and small posteriorly directed disc osteophyte complexes at these locations.  Small mesenteric fat containing periumbilical hernia. Regional soft tissues appear otherwise normal.  IMPRESSION: 1. While no radiopaque gallstones are identified, there is apparent hyperemia involving the wall of the gallbladder with very minimal amount of pericholecystic fluid. Additionally, this finding is associated with a very minimal amount of ill-defined peripancreatic stranding suggestive acute uncomplicated pancreatitis. Clinical correlation is advised though further evaluation could be performed with right upper quadrant gallbladder ultrasound and the acquisition of amylase and lipase levels as indicated. 2. Colonic diverticulosis without evidence of diverticulitis.   Electronically Signed   By: Simonne Come M.D.   On: 08/05/2014 12:36    Scheduled Meds: . citalopram  40 mg Oral Daily  . enoxaparin (LOVENOX) injection  40 mg Subcutaneous Q24H  . gabapentin  300 mg Oral BID  . pantoprazole (PROTONIX) IV  40 mg Intravenous Q24H  . sodium chloride  3 mL Intravenous Q12H   Continuous Infusions: . 0.9 % NaCl with KCl 20 mEq / L 125 mL/hr  at 08/06/14 0030    Active Problems:   Acute pancreatitis   Abdominal pain   Elevated LFTs   Hypokalemia   GERD (gastroesophageal reflux disease)   Pancreatitis    Time spent: 25 minutes   Erick Blinks, M.D.  [facility] Pager [phone]. If 7PM-7AM, please contact night-coverage at [email or web address], password [facility] 08/06/2014, 7:23 AM  LOS: 1 day    I, Arielle Khosrowpour, acting a scribe,  recorded this note contemporaneously in the presence of Dr. Erick Blinks, M.D. on 08/06/2014 at 7:23 AM   Attending note:  I have reviewed the above documentation for accuracy and completeness, and I agree with the above.  Cheyanne Lamison

## 2014-08-06 NOTE — Progress Notes (Signed)
Patient stated she was anxiously awaiting her Abd [place] results and the plan from the doctor.  Spoke with Dr. Roderic Palau to inform of patient's concerns.  Dr. Roderic Palau stated we were waiting for GI to see patient to decide what to do next.  Informed patient of plan and she was satisfied.

## 2014-08-07 DIAGNOSIS — K851 Biliary acute pancreatitis: Secondary | ICD-10-CM

## 2014-08-07 DIAGNOSIS — R109 Unspecified abdominal pain: Secondary | ICD-10-CM

## 2014-08-07 LAB — COMPREHENSIVE METABOLIC PANEL
ALK PHOS: 113 U/L (ref 38–126)
ALT: 201 U/L — AB (ref 14–54)
AST: 95 U/L — ABNORMAL HIGH (ref 15–41)
Albumin: 3 g/dL — ABNORMAL LOW (ref 3.5–5.0)
Anion gap: 7 (ref 5–15)
BILIRUBIN TOTAL: 1.2 mg/dL (ref 0.3–1.2)
BUN: 6 mg/dL (ref 6–20)
CO2: 24 mmol/L (ref 22–32)
Calcium: 8.5 mg/dL — ABNORMAL LOW (ref 8.9–10.3)
Chloride: 108 mmol/L (ref 101–111)
Creatinine, Ser: 0.61 mg/dL (ref 0.44–1.00)
GFR calc Af Amer: >60 mL/min (ref >60)
GFR calc non Af Amer: >60 mL/min (ref >60)
Glucose, Bld: 85 mg/dL (ref 65–99)
Potassium: 3.4 mmol/L — ABNORMAL LOW (ref 3.5–5.1)
SODIUM: 139 mmol/L (ref 135–145)
Total Protein: 5.7 g/dL — ABNORMAL LOW (ref 6.5–8.1)

## 2014-08-07 LAB — HEPATITIS PANEL, ACUTE
HEP A IGM: NEGATIVE
HEP B C IGM: NEGATIVE
Hepatitis B Surface Ag: NEGATIVE

## 2014-08-07 NOTE — Progress Notes (Signed)
Subjective: Pain resolved. Tolerating clear liquids. Feels like chicken broth gave her diarrhea last night. Wants IV fluids decreased, stating her face and hands feel puffy.   Objective: Vital signs in last 24 hours: Temp:  [98.4 F (36.9 C)-98.7 F (37.1 C)] 98.7 F (37.1 C) (08/02 0611) Pulse Rate:  [63-72] 63 (08/02 0611) Resp:  [18] 18 (08/02 0611) BP: (113-126)/(66-72) 126/66 mmHg (08/02 0611) SpO2:  [97 %-98 %] 98 % (08/02 0611) Last BM Date: 08/06/14 (per patient) General:   Alert and oriented, pleasant Head:  Normocephalic and atraumatic. Eyes:  No icterus, sclera clear. Conjuctiva pink.  Mouth:  Without lesions, mucosa pink and moist.  Abdomen:  Bowel sounds present, soft, non-tender, non-distended. Extremities:  Without lower extremity edema. Neurologic:  Alert and  oriented x4;  grossly normal neurologically. Psych:  Alert and cooperative. Normal mood and affect.  Intake/Output from previous day: 08/01 0701 - 08/02 0700 In: 1023 [P.O.:120; I.V.:903] Out: 4 [Urine:3; Stool:1] Intake/Output this shift:    Lab Results:  Recent Labs  08/04/14 1656 08/05/14 1013 08/06/14 0626  WBC 9.2 5.2 5.2  HGB 13.4 14.3 11.5*  HCT 39.2 42.1 35.0*  PLT 272 274 198   BMET  Recent Labs  08/05/14 1013 08/06/14 0626 08/07/14 0556  NA 140 140 139  K 3.3* 3.6 3.4*  CL 101 109 108  CO2 30 26 24   GLUCOSE 111* 96 85  BUN 9 5* 6  CREATININE 0.72 0.62 0.61  CALCIUM 9.2 8.3* 8.5*   LFT  Recent Labs  08/05/14 1013 08/06/14 0626 08/07/14 0556  PROT 7.2 5.5* 5.7*  ALBUMIN 4.0 3.0* 3.0*  AST 529* 206* 95*  ALT 450* 272* 201*  ALKPHOS 138* 114 113  BILITOT 2.8* 1.8* 1.2   Hepatitis Panel  Recent Labs  08/05/14 1013  HEPBSAG Negative  HCVAB <0.1  HEPAIGM Negative  HEPBIGM Negative     Studies/Results: Ct Abdomen Pelvis W Contrast  08/05/2014   CLINICAL DATA:  Worsening epigastric abdominal pain. History of diverticulitis.  EXAM: CT ABDOMEN AND PELVIS  WITH CONTRAST  TECHNIQUE: Multidetector CT imaging of the abdomen and pelvis was performed using the standard protocol following bolus administration of intravenous contrast.  CONTRAST:  154mL OMNIPAQUE IOHEXOL 300 MG/ML SOLN, 36mL OMNIPAQUE IOHEXOL 300 MG/ML SOLN  COMPARISON:  02/23/2013  FINDINGS: Normal hepatic contour. No discrete hepatic lesions. No radiopaque gallstones, however there is apparent hyperemia involving the gallbladder wall with potential minimal amount of pericholecystic fluid (representative axial image 25, series 2, coronal image 32, series 3). No definite intra or extrahepatic biliary duct dilatation though query minimal amount of peripancreatic stranding (representative axial image 25, series 2, coronal images 40 and 49, series 3). No discrete pancreatic mass or pancreatic ductal dilatation on this non pancreatic protocol CT. There is homogeneous enhancement of the pancreatic parenchyma.  There is symmetric enhancement and excretion of the bilateral kidneys. Incidental note is made of a right-sided extrarenal pelvis. No definite renal stones on this postcontrast examination. No urinary obstruction or perinephric stranding. No discrete renal lesions.  Normal appearance of the bilateral adrenal glands and spleen.  Ingested enteric contrast extends to the level of the distal small bowel.  Colonic diverticulosis without evidence of diverticulitis. The bowel is otherwise normal in course and caliber without wall thickening or evidence of obstruction. Normal appearance of the retrocecal appendix. No pneumoperitoneum, pneumatosis or portal venous gas.  Normal caliber of the abdominal aorta. The major branch vessels of the abdominal aorta appear  widely patent.  No bulky retroperitoneal, mesenteric, pelvic or inguinal lymphadenopathy.  Normal appearance of the pelvic organs. Normal appearance of the urinary bladder given degree distention.  There is a very small amount of fluid seen within the root of  the mesocolon within the lower pelvis (image 67, series 2). No definable/drainable fluid collections.  Limited visualization of the lower thorax demonstrates minimal dependent subpleural ground-glass atelectasis. No discrete focal airspace opacities. No pleural effusion.  Normal heart size.  No pericardial effusion.  No acute or aggressive osseous abnormalities. Moderate severe multilevel lumbar spine DDD, worse at L3-L4 and L4-L5 with disc space height loss, endplate irregularity and small posteriorly directed disc osteophyte complexes at these locations.  Small mesenteric fat containing periumbilical hernia. Regional soft tissues appear otherwise normal.  IMPRESSION: 1. While no radiopaque gallstones are identified, there is apparent hyperemia involving the wall of the gallbladder with very minimal amount of pericholecystic fluid. Additionally, this finding is associated with a very minimal amount of ill-defined peripancreatic stranding suggestive acute uncomplicated pancreatitis. Clinical correlation is advised though further evaluation could be performed with right upper quadrant gallbladder ultrasound and the acquisition of amylase and lipase levels as indicated. 2. Colonic diverticulosis without evidence of diverticulitis.   Electronically Signed   By: Sandi Mariscal M.D.   On: 08/05/2014 12:36   US Abdomen Limited Ruq  08/06/2014   CLINICAL DATA:  Right upper quadrant pain.  Abnormal CT scan.  EXAM: US ABDOMEN LIMITED - RIGHT UPPER QUADRANT  COMPARISON:  CT 08/05/2014.  FINDINGS: Gallbladder:  The gallbladder is contracted. No shadowing in the gallbladder fossa to suggest the presence of gallstones. Gallbladder wall thickening to 3.9 mm noted. This could be from contracted state or a process such as hypoproteinemia or cholecystitis. Negative Murphy sign. No pericholecystic fluid noted.  Common bile duct:  Diameter: 3.7 mm  Liver:  Small right pleural effusion.  IMPRESSION: 1. Gallbladder is contracted.  Gallbladder wall thickening to 3.9 mm. This could be from a contracted state or process such as hypoproteinemia or cholecystitis. No shadowing noted in the gallbladder fossa to suggest the presence of gallstones . Negative Murphy sign. No pericholecystic fluid collection noted. 2. Small right pleural effusion.   Electronically Signed   By: Marcello Moores  Register   On: 08/06/2014 09:15    Assessment: 54 year old female admitted with likely biliary pancreatitis, with LFTs continuing to improve since admission. Lipase normalized as of yesterday. Tolerating clear liquids without difficulty. Would like to see Dr. Arnoldo Morale next week for consideration of elective cholecystectomy. If recurrent bump in LFTs would recommend MRCP; however, appears that clinically she is improving and likely appropriate for discharge in next 24 hours.   Plan: May advance to full liquids Continue supportive measures Repeat LFTs in am Appointment with Dr.Jenkins next week for cholecystectomy   Orvil Feil, ANP-BC [facility]    LOS: 2 days    08/07/2014, 7:59 AM

## 2014-08-07 NOTE — Progress Notes (Signed)
[facility] PROGRESS NOTE  Brianna [name] FIE:[id number] DOB: 22-Dec-1960 DOA: 08/05/2014 PCP: Lance Bosch, NP  Summary This is a 54 year old female with a history of GERD who presented with acute pancreatitis, abdominal pain and elevated LFTs. abdominal US did not show any evidence of gallstones . Hypokalemia has resolved and acute pancreatitis has significantly improved with IVF. Currently advancing diet. Suspect pancreatitis may be related to biliary etiology. Patient plans to follow up with general surgery next week for consideration of elective cholecystectomy.  Assessment/Plan: 1. Acute Pancreatitis. Improving with aggressive IVF treatment,pain management and bowel rest, lipase now 45. Etiology not entirely clear although may suspect she may have passed a gallstone. She denies ETOH. CT A/P did not show any evidence of bilary duct dilation or residual gallstones. Abdominal ultrasound did not show any evidence of gallstones. Triglycerides were normal. Will advance to full liquids today. Patient has tolerated clear liquids. Pain management with IV opiates. GI appreciated and decreased MIVFs decreased to 50 cc/hr.  2. Abdominal pain. Related to acute pancreatitis. Improving, will continue pain mangement. 3. Elevated LFTs. Suspect reactive to pancreatitis vs. transient biliary obstruction. Further imagining, with abdomen US, no evidence of gallstones. LFTs are trending down. Hepatitis Panel checked and was normal/negative.    4. Hypokalemia. Resolved after potassium repletion.  Potassium 3.4 today. Continue to monitor 5. GERD. Continue PPI.   Code Status: Full DVT Prophylaxis: Lovenox Family Communication: patient is by herself, care plan was discussed in detail with her and there were no questions at this time. Disposition Plan: home within the next 24 hours    Consultants:  GI   Procedures:    Antibiotics:    HPI/Subjective: Patient reports her stomach is hurting,  describes the pain as burning. she believes it is because she is hungry.  No n/v/d but she reports her stomach is burning.   Objective: Filed Vitals:   08/07/14 0611  BP: 126/66  Pulse: 63  Temp: 98.7 F (37.1 C)  Resp: 18    Intake/Output Summary (Last 24 hours) at 08/07/14 0733 Last data filed at 08/06/14 1745  Gross per 24 hour  Intake    123 ml  Output      4 ml  Net    119 ml   Filed Weights   08/05/14 0933 08/05/14 1532  Weight: 67.132 kg (148 lb) 72.621 kg (160 lb 1.6 oz)    Exam:   General:  Resting comfortably in bed, afebrile,awake, Vital signs stable  Cardiovascular: s1 ,s2, no murmurs, rubs or gallops, RRR   Respiratory: CTA bilaterally, no wheezing  Abdomen: soft, mild tenderness midline, normal bowel sounds, no distension  Musculoskeletal: no LE edema   Data Reviewed: Basic Metabolic Panel:  Recent Labs Lab 08/04/14 1656 08/05/14 1013 08/06/14 0626 08/07/14 0556  NA 139 140 140 139  K 3.1* 3.3* 3.6 3.4*  CL 101 101 109 108  CO2 29 30 26 24   GLUCOSE 113* 111* 96 85  BUN 17 9 5* 6  CREATININE 0.93 0.72 0.62 0.61  CALCIUM 9.2 9.2 8.3* 8.5*   Liver Function Tests:  Recent Labs Lab 08/04/14 1656 08/05/14 1013 08/06/14 0626 08/07/14 0556  AST 126* 529* 206* 95*  ALT 60* 450* 272* 201*  ALKPHOS 96 138* 114 113  BILITOT 0.9 2.8* 1.8* 1.2  PROT 6.7 7.2 5.5* 5.7*  ALBUMIN 4.0 4.0 3.0* 3.0*    Recent Labs Lab 08/04/14 1656 08/05/14 1013 08/06/14 0626  LIPASE 12* 711* 45  No results for input(s): AMMONIA in the last 168 hours. CBC:  Recent Labs Lab 08/04/14 1656 08/05/14 1013 08/06/14 0626  WBC 9.2 5.2 5.2  NEUTROABS 6.5 3.1  --   HGB 13.4 14.3 11.5*  HCT 39.2 42.1 35.0*  MCV 85.0 85.4 86.8  PLT 272 274 198   Cardiac Enzymes: No results for input(s): CKTOTAL, CKMB, CKMBINDEX, TROPONINI in the last 168 hours. BNP (last 3 results) No results for input(s): BNP in the last 8760 hours.  ProBNP (last 3 results) No results  for input(s): PROBNP in the last 8760 hours.  CBG: No results for input(s): GLUCAP in the last 168 hours.  No results found for this or any previous visit (from the past 240 hour(s)).   Studies: Ct Abdomen Pelvis W Contrast  08/05/2014   CLINICAL DATA:  Worsening epigastric abdominal pain. History of diverticulitis.  EXAM: CT ABDOMEN AND PELVIS WITH CONTRAST  TECHNIQUE: Multidetector CT imaging of the abdomen and pelvis was performed using the standard protocol following bolus administration of intravenous contrast.  CONTRAST:  168mL OMNIPAQUE IOHEXOL 300 MG/ML SOLN, 67mL OMNIPAQUE IOHEXOL 300 MG/ML SOLN  COMPARISON:  02/23/2013  FINDINGS: Normal hepatic contour. No discrete hepatic lesions. No radiopaque gallstones, however there is apparent hyperemia involving the gallbladder wall with potential minimal amount of pericholecystic fluid (representative axial image 25, series 2, coronal image 32, series 3). No definite intra or extrahepatic biliary duct dilatation though query minimal amount of peripancreatic stranding (representative axial image 25, series 2, coronal images 40 and 49, series 3). No discrete pancreatic mass or pancreatic ductal dilatation on this non pancreatic protocol CT. There is homogeneous enhancement of the pancreatic parenchyma.  There is symmetric enhancement and excretion of the bilateral kidneys. Incidental note is made of a right-sided extrarenal pelvis. No definite renal stones on this postcontrast examination. No urinary obstruction or perinephric stranding. No discrete renal lesions.  Normal appearance of the bilateral adrenal glands and spleen.  Ingested enteric contrast extends to the level of the distal small bowel.  Colonic diverticulosis without evidence of diverticulitis. The bowel is otherwise normal in course and caliber without wall thickening or evidence of obstruction. Normal appearance of the retrocecal appendix. No pneumoperitoneum, pneumatosis or portal venous  gas.  Normal caliber of the abdominal aorta. The major branch vessels of the abdominal aorta appear widely patent.  No bulky retroperitoneal, mesenteric, pelvic or inguinal lymphadenopathy.  Normal appearance of the pelvic organs. Normal appearance of the urinary bladder given degree distention.  There is a very small amount of fluid seen within the root of the mesocolon within the lower pelvis (image 67, series 2). No definable/drainable fluid collections.  Limited visualization of the lower thorax demonstrates minimal dependent subpleural ground-glass atelectasis. No discrete focal airspace opacities. No pleural effusion.  Normal heart size.  No pericardial effusion.  No acute or aggressive osseous abnormalities. Moderate severe multilevel lumbar spine DDD, worse at L3-L4 and L4-L5 with disc space height loss, endplate irregularity and small posteriorly directed disc osteophyte complexes at these locations.  Small mesenteric fat containing periumbilical hernia. Regional soft tissues appear otherwise normal.  IMPRESSION: 1. While no radiopaque gallstones are identified, there is apparent hyperemia involving the wall of the gallbladder with very minimal amount of pericholecystic fluid. Additionally, this finding is associated with a very minimal amount of ill-defined peripancreatic stranding suggestive acute uncomplicated pancreatitis. Clinical correlation is advised though further evaluation could be performed with right upper quadrant gallbladder ultrasound and the acquisition of amylase and lipase  levels as indicated. 2. Colonic diverticulosis without evidence of diverticulitis.   Electronically Signed   By: Sandi Mariscal M.D.   On: 08/05/2014 12:36   US Abdomen Limited Ruq  08/06/2014   CLINICAL DATA:  Right upper quadrant pain.  Abnormal CT scan.  EXAM: US ABDOMEN LIMITED - RIGHT UPPER QUADRANT  COMPARISON:  CT 08/05/2014.  FINDINGS: Gallbladder:  The gallbladder is contracted. No shadowing in the gallbladder  fossa to suggest the presence of gallstones. Gallbladder wall thickening to 3.9 mm noted. This could be from contracted state or a process such as hypoproteinemia or cholecystitis. Negative Murphy sign. No pericholecystic fluid noted.  Common bile duct:  Diameter: 3.7 mm  Liver:  Small right pleural effusion.  IMPRESSION: 1. Gallbladder is contracted. Gallbladder wall thickening to 3.9 mm. This could be from a contracted state or process such as hypoproteinemia or cholecystitis. No shadowing noted in the gallbladder fossa to suggest the presence of gallstones . Negative Murphy sign. No pericholecystic fluid collection noted. 2. Small right pleural effusion.   Electronically Signed   By: Marcello Moores  Register   On: 08/06/2014 09:15    Scheduled Meds: . citalopram  40 mg Oral Daily  . enoxaparin (LOVENOX) injection  40 mg Subcutaneous Q24H  . gabapentin  300 mg Oral BID  . pantoprazole  40 mg Oral QAC supper  . sodium chloride  3 mL Intravenous Q12H   Continuous Infusions: . 0.9 % NaCl with KCl 20 mEq / L 75 mL/hr (08/06/14 2341)    Active Problems:   Acute pancreatitis   Abdominal pain   Elevated LFTs   Hypokalemia   GERD (gastroesophageal reflux disease)   Pancreatitis    Time spent:25 minutes   Kathie Dike, M.D.  [facility] Pager [phone]. If 7PM-7AM, please contact night-coverage at [email or web address], password [facility] 08/07/2014, 7:33 AM  LOS: 2 days    I, Arielle Khosrowpour, acting a scribe, recorded this note contemporaneously in the presence of Dr. Kathie Dike, M.D. on 08/07/2014 at 7:33 AM   Attending note:  I have reviewed the above documentation for accuracy and completeness, and I agree with the above.  Kathie Dike, MD

## 2014-08-08 ENCOUNTER — Telehealth: Payer: Self-pay | Admitting: Gastroenterology

## 2014-08-08 DIAGNOSIS — E876 Hypokalemia: Secondary | ICD-10-CM

## 2014-08-08 DIAGNOSIS — K219 Gastro-esophageal reflux disease without esophagitis: Secondary | ICD-10-CM

## 2014-08-08 LAB — COMPREHENSIVE METABOLIC PANEL
ALBUMIN: 3.1 g/dL — AB (ref 3.5–5.0)
ALT: 155 U/L — ABNORMAL HIGH (ref 14–54)
ANION GAP: 5 (ref 5–15)
AST: 52 U/L — AB (ref 15–41)
Alkaline Phosphatase: 118 U/L (ref 38–126)
BUN: 5 mg/dL — AB (ref 6–20)
CO2: 27 mmol/L (ref 22–32)
Calcium: 8.8 mg/dL — ABNORMAL LOW (ref 8.9–10.3)
Chloride: 110 mmol/L (ref 101–111)
Creatinine, Ser: 0.62 mg/dL (ref 0.44–1.00)
GFR calc non Af Amer: >60 mL/min (ref >60)
Glucose, Bld: 98 mg/dL (ref 65–99)
Potassium: 4.5 mmol/L (ref 3.5–5.1)
Sodium: 142 mmol/L (ref 135–145)
TOTAL PROTEIN: 5.8 g/dL — AB (ref 6.5–8.1)
Total Bilirubin: 0.6 mg/dL (ref 0.3–1.2)

## 2014-08-08 NOTE — Progress Notes (Signed)
    Subjective: No abdominal pain, N/V. Tolerating diet. Ready to go home. Mother at bedside.   Objective: Vital signs in last 24 hours: Temp:  [98.3 F (36.8 C)-98.6 F (37 C)] 98.3 F (36.8 C) (08/03 0628) Pulse Rate:  [71-106] 73 (08/03 0628) Resp:  [16-18] 18 (08/03 0628) BP: (99-123)/(57-81) 123/81 mmHg (08/03 0628) SpO2:  [98 %-99 %] 98 % (08/03 0628) Last BM Date: 08/07/14 General:   Alert and oriented, pleasant Head:  Normocephalic and atraumatic. Eyes:  No icterus, sclera clear. Conjuctiva pink.  Abdomen:  Bowel sounds present, soft, non-tender, non-distended. No HSM or hernias noted. No rebound or guarding. No masses appreciated  Extremities:  Without  edema. Neurologic:  Alert and  oriented x4;  grossly normal neurologically. Psych:  Alert and cooperative. Normal mood and affect.  Intake/Output from previous day: 08/02 0701 - 08/03 0700 In: 1304.2 [P.O.:720; I.V.:584.2] Out: 6 [Urine:5; Stool:1] Intake/Output this shift:    Lab Results:  Recent Labs  08/05/14 1013 08/06/14 0626  WBC 5.2 5.2  HGB 14.3 11.5*  HCT 42.1 35.0*  PLT 274 198   BMET  Recent Labs  08/06/14 0626 08/07/14 0556 08/08/14 0620  NA 140 139 142  K 3.6 3.4* 4.5  CL 109 108 110  CO2 26 24 27   GLUCOSE 96 85 98  BUN 5* 6 5*  CREATININE 0.62 0.61 0.62  CALCIUM 8.3* 8.5* 8.8*   LFT  Recent Labs  08/06/14 0626 08/07/14 0556 08/08/14 0620  PROT 5.5* 5.7* 5.8*  ALBUMIN 3.0* 3.0* 3.1*  AST 206* 95* 52*  ALT 272* 201* 155*  ALKPHOS 114 113 118  BILITOT 1.8* 1.2 0.6   Hepatitis Panel  Recent Labs  08/05/14 1013  HEPBSAG Negative  HCVAB <0.1  HEPAIGM Negative  HEPBIGM Negative   Studies/Results: US Abdomen Limited Ruq  08/06/2014   CLINICAL DATA:  Right upper quadrant pain.  Abnormal CT scan.  EXAM: US ABDOMEN LIMITED - RIGHT UPPER QUADRANT  COMPARISON:  CT 08/05/2014.  FINDINGS: Gallbladder:  The gallbladder is contracted. No shadowing in the gallbladder fossa to  suggest the presence of gallstones. Gallbladder wall thickening to 3.9 mm noted. This could be from contracted state or a process such as hypoproteinemia or cholecystitis. Negative Murphy sign. No pericholecystic fluid noted.  Common bile duct:  Diameter: 3.7 mm  Liver:  Small right pleural effusion.  IMPRESSION: 1. Gallbladder is contracted. Gallbladder wall thickening to 3.9 mm. This could be from a contracted state or process such as hypoproteinemia or cholecystitis. No shadowing noted in the gallbladder fossa to suggest the presence of gallstones . Negative Murphy sign. No pericholecystic fluid collection noted. 2. Small right pleural effusion.   Electronically Signed   By: Marcello Moores  Register   On: 08/06/2014 09:15    Assessment: 54 year old female admitted with likely biliary pancreatitis, with LFTs continuing to improve since admission. Lipase normalized. Tolerating advanced diet without difficulty. Would like to see Dr. Arnoldo Morale next week for consideration of elective cholecystectomy. Appropriate for discharge home today.   Plan: Will arrange outpatient follow-up with Dr. Lynett Grimes diet Appropriate for discharge today Will follow-up as outpatient and arrange through our office   Orvil Feil, ANP-BC [facility]    LOS: 3 days    08/08/2014, 7:55 AM

## 2014-08-08 NOTE — Care Management Note (Signed)
Case Management Note  Patient Details  Name: KEARIA YIN MRN: [id number] Date of Birth: [birth year]  Subjective/Objective:                    Action/Plan:   Expected Discharge Date:                  Expected Discharge Plan:  Home/Self Care  In-House Referral:  Financial Counselor  Discharge planning Services  CM Consult  Post Acute Care Choice:  NA Choice offered to:  NA  DME Arranged:    DME Agency:     HH Arranged:    [place] Agency:     Status of Service:  Completed, signed off  Medicare Important Message Given:    Date Medicare IM Given:    Medicare IM give by:    Date Additional Medicare IM Given:    Additional Medicare Important Message give by:     If discussed at [place] of Stay Meetings, dates discussed:    Additional Comments: Pt discharged home today. No CM needs noted. Pt will follow up with Dr Arnoldo Morale as outpt for surgery. Christinia Gully [place], RN 08/08/2014, 10:30 AM

## 2014-08-08 NOTE — Discharge Summary (Signed)
Physician Discharge Summary  Brianna [name] FTD:[id number] DOB: 01/22/1960 DOA: 08/05/2014  PCP: Ambrose Finland, NP  Admit date: 08/05/2014 Discharge date: 08/08/2014  Time spent: 25 minutes  Recommendations for Outpatient Follow-up:  1. See Dr Lovell Sheehan in consideration for elective lap CCY. 2. See Your PCP as previously scheduled.   Discharge Diagnoses:  Active Problems:   Acute pancreatitis   Abdominal pain   Elevated LFTs   Hypokalemia   GERD (gastroesophageal reflux disease)   Pancreatitis   Discharge Condition: improved.  No nausea, vomiting, or abdominal pain with eating.  Diet recommendation: Low Fat diet.   Filed Weights   08/05/14 0933 08/05/14 1532  Weight: 67.132 kg (148 lb) 72.621 kg (160 lb 1.6 oz)    History of present illness:  Patient was admitted for acute pancreatitis by Dr Durward Mallard on August 05, 2014.  As per his H and P:  " Pt is a 54 y/o female with a PMHx of HTN, GERD, diverticulitis (Sept/Oct 2015) , depression, anxiety, and arthritis, who presents to the ED for c/o abdominal pain that onset yesterday, but significantly worsened today at 6am after waking from rest. She reports nausea, but no vomiting, fever, diarrhea, diaphoresis, chest pain, or SOB. Normal BMs, last was yesterday. She was seen and treated in the ED for similar sxs yesterday and the sxs improved upon discharge with pain medication. Labs done in the ED yesrterday were unremarkable. However, she reports the pain to be different from yesterday as it was in the lower abdomen then, but now it is in the epigastric/upper region. The pain also radiates into the back and feels similar to one of her diverticulitis flares. She is currently followed by Dr. Jena Gauss.   Former drinker, years ago. She denies taking hormonal supplements currently.    Hospital Course: Patient was admitted and was given bowel rest.  She was given antiemetics and analgesics.  Her liver enzymes were elevated, somewhat of a  obtructive pattern with elevated alkaline phosphatase.  Her abd [place] did not show any evidence of gallstone, Triglyceride wasw normal, and her diuretic were discontinued.  GI was consulted and help with her management as well.  It was felt that her pancreatitis may have been related to gallstone, and passed stone.  Her LFT came down and her lipase went down to 47.  Her original clear liquid was advanced, and she was able to eat without further nausea, vomiting or abdominal pain. Her low K was supplemented.  She is anxious to go home, and GI felt she was ready for discharge.  She will follow up with her PCP, along with seeing Dr Lovell Sheehan for consideration of an elective laparoscopy cholecystectomy.  She will avoid fatty food.  Thank you and Good day.    Procedures:Consultations:  GI :  Dr Raj Janus.   Discharge Exam: Filed Vitals:   08/08/14 0628  BP: 123/81  Pulse: 73  Temp: 98.3 F (36.8 C)  Resp: 18    Discharge Instructions   Discharge Instructions    Diet - low sodium heart healthy    Complete by:  As directed      Increase activity slowly    Complete by:  As directed           Current Discharge Medication List    CONTINUE these medications which have NOT CHANGED   Details  citalopram (CELEXA) 40 MG tablet Take 1 tablet (40 mg total) by mouth daily. Qty: 30 tablet, Refills: 3   Associated  Diagnoses: Depression    cyclobenzaprine (FLEXERIL) 10 MG tablet Take 1 tablet (10 mg total) by mouth 3 (three) times daily as needed for muscle spasms. Qty: 30 tablet, Refills: 2   Associated Diagnoses: Cervical disc disorder with radiculopathy of cervical region    dexlansoprazole (DEXILANT) 60 MG capsule Take 1 capsule (60 mg total) by mouth daily. Qty: 30 capsule, Refills: 11    gabapentin (NEURONTIN) 300 MG capsule Take 1 capsule (300 mg total) by mouth 2 (two) times daily. Qty: 60 capsule, Refills: 1   Associated Diagnoses: Cervical disc disorder with radiculopathy of cervical  region    lubiprostone (AMITIZA) 24 MCG capsule Take 1 capsule (24 mcg total) by mouth 2 (two) times daily with a meal. Qty: 28 capsule, Refills: 0      STOP taking these medications     hydrochlorothiazide (HYDRODIURIL) 25 MG tablet        Allergies  Allergen Reactions  . Tylenol With Codeine #3 [Acetaminophen-Codeine] Other (See Comments)    headache      The results of significant diagnostics from this hospitalization (including imaging, microbiology, ancillary and laboratory) are listed below for reference.    Significant Diagnostic Studies: Ct Abdomen Pelvis W Contrast  08/05/2014   CLINICAL DATA:  Worsening epigastric abdominal pain. History of diverticulitis.  EXAM: CT ABDOMEN AND PELVIS WITH CONTRAST  TECHNIQUE: Multidetector CT imaging of the abdomen and pelvis was performed using the standard protocol following bolus administration of intravenous contrast.  CONTRAST:  OMNIPAQUE IOHEXOL 300 MG/ML SOLN, 25mL OMNIPAQUE IOHEXOL 300 MG/ML SOLN  COMPARISON:  02/23/2013  FINDINGS: Normal hepatic contour. No discrete hepatic lesions. No radiopaque gallstones, however there is apparent hyperemia involving the gallbladder wall with potential minimal amount of pericholecystic fluid (representative axial image 25, series 2, coronal image 32, series 3). No definite intra or extrahepatic biliary duct dilatation though query minimal amount of peripancreatic stranding (representative axial image 25, series 2, coronal images 40 and 49, series 3). No discrete pancreatic mass or pancreatic ductal dilatation on this non pancreatic protocol CT. There is homogeneous enhancement of the pancreatic parenchyma.  There is symmetric enhancement and excretion of the bilateral kidneys. Incidental note is made of a right-sided extrarenal pelvis. No definite renal stones on this postcontrast examination. No urinary obstruction or perinephric stranding. No discrete renal lesions.  Normal appearance of the  bilateral adrenal glands and spleen.  Ingested enteric contrast extends to the level of the distal small bowel.  Colonic diverticulosis without evidence of diverticulitis. The bowel is otherwise normal in course and caliber without wall thickening or evidence of obstruction. Normal appearance of the retrocecal appendix. No pneumoperitoneum, pneumatosis or portal venous gas.  Normal caliber of the abdominal aorta. The major branch vessels of the abdominal aorta appear widely patent.  No bulky retroperitoneal, mesenteric, pelvic or inguinal lymphadenopathy.  Normal appearance of the pelvic organs. Normal appearance of the urinary bladder given degree distention.  There is a very small amount of fluid seen within the root of the mesocolon within the lower pelvis (image 67, series 2). No definable/drainable fluid collections.  Limited visualization of the lower thorax demonstrates minimal dependent subpleural ground-glass atelectasis. No discrete focal airspace opacities. No pleural effusion.  Normal heart size.  No pericardial effusion.  No acute or aggressive osseous abnormalities. Moderate severe multilevel lumbar spine DDD, worse at L3-L4 and L4-L5 with disc space height loss, endplate irregularity and small posteriorly directed disc osteophyte complexes at these locations.  Small mesenteric fat containing  periumbilical hernia. Regional soft tissues appear otherwise normal.  IMPRESSION: 1. While no radiopaque gallstones are identified, there is apparent hyperemia involving the wall of the gallbladder with very minimal amount of pericholecystic fluid. Additionally, this finding is associated with a very minimal amount of ill-defined peripancreatic stranding suggestive acute uncomplicated pancreatitis. Clinical correlation is advised though further evaluation could be performed with right upper quadrant gallbladder ultrasound and the acquisition of amylase and lipase levels as indicated. 2. Colonic diverticulosis  without evidence of diverticulitis.   Electronically Signed   By: Simonne Come M.D.   On: 08/05/2014 12:36   US Abdomen Limited Ruq  08/06/2014   CLINICAL DATA:  Right upper quadrant pain.  Abnormal CT scan.  EXAM: US ABDOMEN LIMITED - RIGHT UPPER QUADRANT  COMPARISON:  CT 08/05/2014.  FINDINGS: Gallbladder:  The gallbladder is contracted. No shadowing in the gallbladder fossa to suggest the presence of gallstones. Gallbladder wall thickening to 3.9 mm noted. This could be from contracted state or a process such as hypoproteinemia or cholecystitis. Negative Murphy sign. No pericholecystic fluid noted.  Common bile duct:  Diameter: 3.7 mm  Liver:  Small right pleural effusion.  IMPRESSION: 1. Gallbladder is contracted. Gallbladder wall thickening to 3.9 mm. This could be from a contracted state or process such as hypoproteinemia or cholecystitis. No shadowing noted in the gallbladder fossa to suggest the presence of gallstones . Negative Murphy sign. No pericholecystic fluid collection noted. 2. Small right pleural effusion.   Electronically Signed   ByMaisie Fus  Register   On: 08/06/2014 09:15    Recent Labs Lab 08/04/14 1656 08/05/14 1013 08/06/14 0626 08/07/14 0556 08/08/14 0620  NA 139 140 140 139 142  K 3.1* 3.3* 3.6 3.4* 4.5  CL 101 101 109 108 110  CO2 29 30 26 24 27   GLUCOSE 113* 111* 96 85 98  BUN 17 9 5* 6 5*  CREATININE 0.93 0.72 0.62 0.61 0.62  CALCIUM 9.2 9.2 8.3* 8.5* 8.8*   Liver Function Tests:  Recent Labs Lab 08/04/14 1656 08/05/14 1013 08/06/14 0626 08/07/14 0556 08/08/14 0620  AST 126* 529* 206* 95* 52*  ALT 60* 450* 272* 201* 155*  ALKPHOS 96 138* 114 113 118  BILITOT 0.9 2.8* 1.8* 1.2 0.6  PROT 6.7 7.2 5.5* 5.7* 5.8*  ALBUMIN 4.0 4.0 3.0* 3.0* 3.1*    Recent Labs Lab 08/04/14 1656 08/05/14 1013 08/06/14 0626  LIPASE 12* 711* 45   CBC:  Recent Labs Lab 08/04/14 1656 08/05/14 1013 08/06/14 0626  WBC 9.2 5.2 5.2  NEUTROABS 6.5 3.1  --   HGB 13.4  14.3 11.5*  HCT 39.2 42.1 35.0*  MCV 85.0 85.4 86.8  PLT 272 274 198    Signed:  Janicia Monterrosa  [facility] 08/08/2014, 9:55 AM

## 2014-08-08 NOTE — Progress Notes (Signed)
Patient discharged.  IV removed - WNL.  Instructed to follow up with Dr. Arnoldo Morale, no changes to meds made.  Educated on pancreatitis.  Verbalizes understanding of discharge instructions, no questions at this time.  Ambulated off unit with staff assist in stable condition.

## 2014-08-08 NOTE — Telephone Encounter (Signed)
Patient needs appt with Dr. Arnoldo Morale as outpatient next week to discuss cholecystectomy. Recently inpatient for biliary pancreatitis,.

## 2014-08-11 ENCOUNTER — Other Ambulatory Visit: Payer: Self-pay | Admitting: Sports Medicine

## 2014-08-13 ENCOUNTER — Ambulatory Visit: Payer: No Typology Code available for payment source | Admitting: Nurse Practitioner

## 2014-08-13 ENCOUNTER — Other Ambulatory Visit: Payer: Self-pay

## 2014-08-13 DIAGNOSIS — Z719 Counseling, unspecified: Secondary | ICD-10-CM

## 2014-08-13 DIAGNOSIS — R109 Unspecified abdominal pain: Secondary | ICD-10-CM

## 2014-08-13 NOTE — Telephone Encounter (Signed)
Pt called regarding appt with Dr. Arnoldo Morale for cholecystectomy.  Patient has cone assistance and Dr. Arnoldo Morale doesn't accept.  Pt was referred to [facility] due to insurance

## 2014-08-13 NOTE — Telephone Encounter (Signed)
Noted  

## 2014-08-14 ENCOUNTER — Ambulatory Visit (INDEPENDENT_AMBULATORY_CARE_PROVIDER_SITE_OTHER): Payer: Self-pay | Admitting: Surgery

## 2014-08-14 ENCOUNTER — Telehealth: Payer: Self-pay | Admitting: Surgery

## 2014-08-14 ENCOUNTER — Encounter: Payer: Self-pay | Admitting: Surgery

## 2014-08-14 VITALS — BP 125/85 | HR 82 | Temp 98.3°F | Ht 63.0 in | Wt 154.4 lb

## 2014-08-14 DIAGNOSIS — R1013 Epigastric pain: Secondary | ICD-10-CM

## 2014-08-14 DIAGNOSIS — K851 Biliary acute pancreatitis without necrosis or infection: Secondary | ICD-10-CM

## 2014-08-14 DIAGNOSIS — R945 Abnormal results of liver function studies: Secondary | ICD-10-CM

## 2014-08-14 DIAGNOSIS — G8929 Other chronic pain: Secondary | ICD-10-CM

## 2014-08-14 DIAGNOSIS — R7989 Other specified abnormal findings of blood chemistry: Secondary | ICD-10-CM

## 2014-08-14 DIAGNOSIS — K219 Gastro-esophageal reflux disease without esophagitis: Secondary | ICD-10-CM

## 2014-08-14 NOTE — Telephone Encounter (Signed)
Pt advised of pre op date/time and sx date. Sx: 08/16/14 with Dr Alease Frame chole Pre op: Phone: 08/15/14 bw 1-5pm.

## 2014-08-14 NOTE — Progress Notes (Signed)
Patient ID: Brianna [name], female   DOB: [birth year], 54 y.o.   MRN: [id number]  [facility]:  HPI Location, Quality, Duration, Severity, Timing, Context, Modifying Factors, Associated Signs and Symptoms.  Brianna [name] is a 54 y.o. female.  who was discharged from a local hospital with diagnosis of gallstone pancreatitis. Ultrasound did not demonstrate any evidence of stones obviously. There was hyperemia seen on CT scan. Liver function tests normalize along with lipase. She states that her history started abruptly on July 30 with pain in the epigastrium radiating directly to her back. She had some mild nausea. Symptoms abated in the hospital she was discharged home with follow-up in the surgical office for consideration of cholecystectomy done prophylactically.  Over the last several months she has had difficulty with certain foods causing her pain nausea. There is been no regurgitation. She does have a history of hiatal hernia and diverticulosis diagnosed in 2015. Her only past surgical history is that of a cesarean section via a Pfannenstiel incision.  Past Medical History  Diagnosis Date  . Depression   . Hypertension   . Anxiety   . GERD (gastroesophageal reflux disease)   . Headache   . Arthritis   . Chronic right hip pain since 09/2012  . Pancreatitis   . Chronic neck pain   . Chronic back pain     Past Surgical History  Procedure Laterality Date  . Cesarean section    . Oophorectomy    . Colonoscopy N/A 08/22/2013    OXB:DZHGDJM diverticulosis paper hemtochezia from anorectal irritation  . Esophagogastroduodenoscopy N/A 08/22/2013    EQA:STMHDQQ reflux esophagitis/noncritical schatzki's ring and hiatial hernia    Family History  Problem Relation Age of Onset  . Colon cancer Neg Hx   . Ovarian cancer Mother   . Gallbladder disease Son     Social History History  Substance Use Topics  . Smoking status: Former Smoker -- 0.50 packs/day for 2 years   Types: Cigarettes    Quit date: 01/06/2007  . Smokeless tobacco: Never Used  . Alcohol Use: No    Allergies  Allergen Reactions  . Tylenol With Codeine #3 [Acetaminophen-Codeine] Other (See Comments)    headache    Current Outpatient Prescriptions  Medication Sig Dispense Refill  . hydrochlorothiazide (HYDRODIURIL) 25 MG tablet Take 25 mg by mouth daily.    . traMADol (ULTRAM) 50 MG tablet Take 50 mg by mouth every 6 (six) hours as needed.    . citalopram (CELEXA) 40 MG tablet Take 1 tablet (40 mg total) by mouth daily. 30 tablet 3  . dexlansoprazole (DEXILANT) 60 MG capsule Take 1 capsule (60 mg total) by mouth daily. 30 capsule 11  . gabapentin (NEURONTIN) 300 MG capsule Take 1 capsule (300 mg total) by mouth 2 (two) times daily. 60 capsule 1  . lubiprostone (AMITIZA) 24 MCG capsule Take 1 capsule (24 mcg total) by mouth 2 (two) times daily with a meal. (Patient taking differently: Take 24 mcg by mouth 2 (two) times daily as needed for constipation. ) 28 capsule 0   No current facility-administered medications for this visit.     Blood pressure 125/85, pulse 82, temperature 98.3 F (36.8 C), temperature source Oral, height 5\' 3"  (1.6 m), weight 70.035 kg (154 lb 6.4 oz).  No results found for this or any previous visit (from the past 48 hour(s)). No results found.  Review of Systems  Constitutional: Negative for fever, chills and weight loss.  HENT: Negative.  Gastrointestinal: Positive for heartburn, nausea, abdominal pain and constipation. Negative for vomiting and diarrhea.  Genitourinary: Negative for dysuria.  Skin: Negative.   Neurological: Negative for weakness.  Endo/Heme/Allergies: Negative.   All other systems reviewed and are negative.   Physical Exam  Constitutional: She is oriented to person, place, and time and well-developed, well-nourished, and in no distress. No distress.  HENT:  Head: Normocephalic and atraumatic.  Eyes: Conjunctivae are normal.  Pupils are equal, round, and reactive to light.  Neck: Neck supple.  Cardiovascular: Normal rate and normal heart sounds.   Pulmonary/Chest: Effort normal and breath sounds normal. No respiratory distress. She has no wheezes. She has no rales.  Abdominal: Soft. She exhibits no distension and no mass. There is no tenderness. There is no rebound and no guarding.  Neurological: She is oriented to person, place, and time.  Skin: Skin is warm and dry. She is not diaphoretic.  Psychiatric: Mood, memory, affect and judgment normal.    Data Reviewed I personally reviewed the chart from the admission to the outside hospital dated the end of last month. Furthermore ultrasound was personally reviewed. There is no evidence of bile duct dilatation. There is possible pericholecystic fluid seen on CT scan imaging.  I have personally reviewed the patient's imaging, laboratory findings and medical records.    Assessment    54 year old female with biliary pancreatitis. As well as biliary colic.    Plan   I discussed with her laparoscopic cholecystectomy to be performed on Thursday. We discussed the 1 in 200 chance of bile duct injury need for conversion open operation bleeding infection possibility of retained stone and future ERCP. All of her questions were answered. She wishes to proceed.      Sherri Rad MD, FACS 08/14/2014, 11:32 AM

## 2014-08-14 NOTE — Patient Instructions (Signed)
You are requesting to have your gallbladder removed. We will arrange to do this on 08/16/14.  You will need to have an telephone pre-op. Our office will call you with the details of this appointment.  In the meantime, try to follow a low-fat diet to decrease symptoms as much as possible.  Please see the handouts that you were given and call with any questions or concerns that you have.  Call our office if you develop another episode of severe pain, if our office is closed call [phone] and ask for the surgeon on call.

## 2014-08-15 ENCOUNTER — Other Ambulatory Visit: Payer: Self-pay

## 2014-08-15 ENCOUNTER — Encounter: Payer: Self-pay | Admitting: *Deleted

## 2014-08-15 NOTE — Patient Instructions (Addendum)
  Your procedure is scheduled on: 08-16-14 Report to [facility] @ 9:30-PT AWARE OF ARRIVAL TIME. Marland Kitchen  Remember: Instructions that are not followed completely may result in serious medical risk, up to and including death, or upon the discretion of your surgeon and anesthesiologist your surgery may need to be rescheduled.    _X___ 1. Do not eat food or drink liquids after midnight. No gum chewing or hard candies.     _X___ 2. No Alcohol for 24 hours before or after surgery.   ____ 3. Bring all medications with you on the day of surgery if instructed.    ____ 4. Notify your doctor if there is any change in your medical condition     (cold, fever, infections).     Do not wear jewelry, make-up, hairpins, clips or nail polish.  Do not wear lotions, powders, or perfumes. You may wear deodorant.  Do not shave 48 hours prior to surgery. Men may shave face and neck.  Do not bring valuables to the hospital.    [facility] is not responsible for any belongings or valuables.               Contacts, dentures or bridgework may not be worn into surgery.  Leave your suitcase in the car. After surgery it may be brought to your room.  For patients admitted to the hospital, discharge time is determined by your treatment team.   Patients discharged the day of surgery will not be allowed to drive home.   Please read over the following fact sheets that you were given:     _X___ Take these medicines the morning of surgery with A SIP OF WATER:    1. GABAPENTIN  2. CITALOPRAM  3. DEXILANT  4. TAKE AN EXTRA DEXILANT TONIGHT  5.  6.  ____ Fleet Enema (as directed)   ____ Use CHG Soap as directed  ____ Use inhalers on the day of surgery  ____ Stop metformin 2 days prior to surgery    ____ Take 1/2 of usual insulin dose the night before surgery and none on the morning of surgery.   ____ Stop Coumadin/Plavix/aspirin-N/A  ____ Stop Anti-inflammatories-NO NSAIDS OR ASA  PRODUCTS-TRAMADOL OK   ____ Stop supplements until after surgery.    ____ Bring C-Pap to the hospital.

## 2014-08-16 ENCOUNTER — Encounter
Admission: RE | Disposition: A | Discharge status: Home / Self Care | Payer: Self-pay | Source: Ambulatory Visit | Attending: Surgery

## 2014-08-16 ENCOUNTER — Ambulatory Visit: Payer: Medicaid Other

## 2014-08-16 ENCOUNTER — Encounter: Payer: Self-pay | Admitting: Surgery

## 2014-08-16 ENCOUNTER — Ambulatory Visit
Admission: RE | Admit: 2014-08-16 | Discharge: 2014-08-16 | Disposition: A | Discharge status: Home / Self Care | Payer: Medicaid Other | Source: Ambulatory Visit | Attending: Surgery | Admitting: Surgery

## 2014-08-16 ENCOUNTER — Ambulatory Visit: Payer: Medicaid Other | Admitting: Anesthesiology

## 2014-08-16 ENCOUNTER — Other Ambulatory Visit: Payer: Self-pay | Admitting: *Deleted

## 2014-08-16 DIAGNOSIS — K8066 Calculus of gallbladder and bile duct with acute and chronic cholecystitis without obstruction: Secondary | ICD-10-CM

## 2014-08-16 DIAGNOSIS — M25551 Pain in right hip: Secondary | ICD-10-CM | POA: Diagnosis not present

## 2014-08-16 DIAGNOSIS — M542 Cervicalgia: Secondary | ICD-10-CM | POA: Insufficient documentation

## 2014-08-16 DIAGNOSIS — I10 Essential (primary) hypertension: Secondary | ICD-10-CM | POA: Diagnosis not present

## 2014-08-16 DIAGNOSIS — M549 Dorsalgia, unspecified: Secondary | ICD-10-CM | POA: Insufficient documentation

## 2014-08-16 DIAGNOSIS — K8012 Calculus of gallbladder with acute and chronic cholecystitis without obstruction: Secondary | ICD-10-CM | POA: Insufficient documentation

## 2014-08-16 DIAGNOSIS — Z87891 Personal history of nicotine dependence: Secondary | ICD-10-CM | POA: Insufficient documentation

## 2014-08-16 DIAGNOSIS — K8020 Calculus of gallbladder without cholecystitis without obstruction: Secondary | ICD-10-CM | POA: Insufficient documentation

## 2014-08-16 DIAGNOSIS — G8929 Other chronic pain: Secondary | ICD-10-CM | POA: Diagnosis not present

## 2014-08-16 HISTORY — DX: Anemia, unspecified: D64.9

## 2014-08-16 HISTORY — PX: CHOLECYSTECTOMY: SHX55

## 2014-08-16 SURGERY — LAPAROSCOPIC CHOLECYSTECTOMY
Anesthesia: General | Wound class: Clean Contaminated

## 2014-08-16 MED ORDER — PHENYLEPHRINE HCL 10 MG/ML IJ SOLN
INTRAMUSCULAR | Status: DC | PRN
Start: 2014-08-16 — End: 2014-08-16
  Administered 2014-08-16: 100 ug via INTRAVENOUS
  Administered 2014-08-16: 200 ug via INTRAVENOUS

## 2014-08-16 MED ORDER — FENTANYL CITRATE (PF) 100 MCG/2ML IJ SOLN
INTRAMUSCULAR | Status: DC
Start: 2014-08-16 — End: 2014-08-16
  Filled 2014-08-16: qty 2

## 2014-08-16 MED ORDER — ONDANSETRON HCL 4 MG/2ML IJ SOLN
4.0000 mg | Freq: Once | INTRAMUSCULAR | Status: AC | PRN
  Administered 2014-08-16: 4 mg via INTRAVENOUS

## 2014-08-16 MED ORDER — TRAMADOL HCL 50 MG PO TABS: 50.0000 mg | ORAL_TABLET | Freq: Three times a day (TID) | ORAL | Status: DC

## 2014-08-16 MED ORDER — ENOXAPARIN SODIUM 40 MG/0.4ML ~~LOC~~ SOLN
40.0000 mg | Freq: Once | SUBCUTANEOUS | Status: AC
  Administered 2014-08-16: 40 mg via SUBCUTANEOUS
  Filled 2014-08-16: qty 0.4

## 2014-08-16 MED ORDER — FENTANYL CITRATE (PF) 100 MCG/2ML IJ SOLN
25.0000 ug | INTRAMUSCULAR | Status: DC | PRN
  Administered 2014-08-16 (×5): 25 ug via INTRAVENOUS

## 2014-08-16 MED ORDER — DEXAMETHASONE SODIUM PHOSPHATE 10 MG/ML IJ SOLN
INTRAMUSCULAR | Status: DC | PRN
Start: 2014-08-16 — End: 2014-08-16
  Administered 2014-08-16: 5 mg via INTRAVENOUS

## 2014-08-16 MED ORDER — IOTHALAMATE MEGLUMINE 60 % INJ SOLN
INTRAMUSCULAR | Status: DC | PRN
  Administered 2014-08-16: 15 mL

## 2014-08-16 MED ORDER — NEOSTIGMINE METHYLSULFATE 10 MG/10ML IV SOLN
INTRAVENOUS | Status: DC | PRN
  Administered 2014-08-16: 3 mg via INTRAVENOUS

## 2014-08-16 MED ORDER — TRAMADOL HCL 50 MG PO TABS: 50.0000 mg | ORAL_TABLET | Freq: Four times a day (QID) | ORAL | Status: DC | PRN

## 2014-08-16 MED ORDER — FENTANYL CITRATE (PF) 100 MCG/2ML IJ SOLN
INTRAMUSCULAR | Status: DC | PRN
  Administered 2014-08-16: 100 ug via INTRAVENOUS

## 2014-08-16 MED ORDER — PROPOFOL 10 MG/ML IV BOLUS
INTRAVENOUS | Status: DC | PRN
  Administered 2014-08-16: 150 mg via INTRAVENOUS

## 2014-08-16 MED ORDER — FENTANYL CITRATE (PF) 100 MCG/2ML IJ SOLN
INTRAMUSCULAR | Status: AC
  Filled 2014-08-16: qty 2

## 2014-08-16 MED ORDER — LACTATED RINGERS IV SOLN
INTRAVENOUS | Status: DC
  Administered 2014-08-16 (×2): via INTRAVENOUS

## 2014-08-16 MED ORDER — CEFAZOLIN SODIUM-DEXTROSE 2-3 GM-% IV SOLR
2.0000 g | INTRAVENOUS | Status: AC
  Administered 2014-08-16: 2 g via INTRAVENOUS

## 2014-08-16 MED ORDER — BUPIVACAINE HCL (PF) 0.25 % IJ SOLN
INTRAMUSCULAR | Status: AC
  Filled 2014-08-16: qty 30

## 2014-08-16 MED ORDER — TRAMADOL HCL 50 MG PO TABS
ORAL_TABLET | ORAL | Status: AC
  Filled 2014-08-16: qty 1

## 2014-08-16 MED ORDER — MIDAZOLAM HCL 5 MG/5ML IJ SOLN
INTRAMUSCULAR | Status: DC | PRN
  Administered 2014-08-16: 2 mg via INTRAVENOUS

## 2014-08-16 MED ORDER — ONDANSETRON HCL 4 MG/2ML IJ SOLN
INTRAMUSCULAR | Status: AC
  Filled 2014-08-16: qty 2

## 2014-08-16 MED ORDER — ONDANSETRON HCL 4 MG/2ML IJ SOLN
INTRAMUSCULAR | Status: DC | PRN
  Administered 2014-08-16: 4 mg via INTRAVENOUS

## 2014-08-16 MED ORDER — BUPIVACAINE HCL 0.25 % IJ SOLN
INTRAMUSCULAR | Status: DC | PRN
  Administered 2014-08-16: 20 mL

## 2014-08-16 MED ORDER — ROCURONIUM BROMIDE 100 MG/10ML IV SOLN
INTRAVENOUS | Status: DC | PRN
  Administered 2014-08-16: 35 mg via INTRAVENOUS

## 2014-08-16 MED ORDER — LIDOCAINE HCL (CARDIAC) 20 MG/ML IV SOLN
INTRAVENOUS | Status: DC | PRN
  Administered 2014-08-16: 45 mg via INTRAVENOUS

## 2014-08-16 MED ORDER — GLYCOPYRROLATE 0.2 MG/ML IJ SOLN
INTRAMUSCULAR | Status: DC | PRN
  Administered 2014-08-16: 0.6 mg via INTRAVENOUS

## 2014-08-16 MED ORDER — CEFAZOLIN SODIUM-DEXTROSE 2-3 GM-% IV SOLR
INTRAVENOUS | Status: AC
  Administered 2014-08-16: 2 g via INTRAVENOUS
  Filled 2014-08-16: qty 50

## 2014-08-16 MED ORDER — TRAMADOL HCL 50 MG PO TABS
50.0000 mg | ORAL_TABLET | Freq: Four times a day (QID) | ORAL | Status: DC | PRN
  Administered 2014-08-16: 50 mg via ORAL

## 2014-08-16 MED ORDER — CHLORHEXIDINE GLUCONATE 4 % EX LIQD: 1.0000 "application " | Freq: Once | CUTANEOUS | Status: DC

## 2014-08-16 SURGICAL SUPPLY — 47 items
APPLIER CLIP 5 13 M/L LIGAMAX5 (MISCELLANEOUS) ×2
BAG COUNTER SPONGE EZ (MISCELLANEOUS) IMPLANT
BENZOIN TINCTURE PRP APPL 2/3 (GAUZE/BANDAGES/DRESSINGS) ×2 IMPLANT
BULB RESERV EVAC DRAIN JP 100C (MISCELLANEOUS) ×2 IMPLANT
CANISTER SUCT 1200ML W/VALVE (MISCELLANEOUS) ×2 IMPLANT
CATH CHOLANG 76X19 KUMAR (CATHETERS) ×2 IMPLANT
CATH FOGERTY 4X80 WAS (CATHETERS) ×2 IMPLANT
CHLORAPREP W/TINT 26ML (MISCELLANEOUS) ×2 IMPLANT
CLIP APPLIE 5 13 M/L LIGAMAX5 (MISCELLANEOUS) ×1 IMPLANT
DEFOGGER SCOPE WARMER CLEARIFY (MISCELLANEOUS) ×2 IMPLANT
DISSECTOR KITTNER STICK (MISCELLANEOUS) ×1 IMPLANT
DISSECTORS/KITTNER STICK (MISCELLANEOUS) ×2
DRAIN CHANNEL JP 19F (MISCELLANEOUS) ×2 IMPLANT
DRAPE C-ARM XRAY 36X54 (DRAPES) ×2 IMPLANT
DRAPE SHEET LG 3/4 BI-LAMINATE (DRAPES) ×2 IMPLANT
DRAPE UTILITY 15X26 TOWEL STRL (DRAPES) ×4 IMPLANT
DRSG TEGADERM 2-3/8X2-3/4 SM (GAUZE/BANDAGES/DRESSINGS) ×2 IMPLANT
DRSG TELFA 3X8 NADH (GAUZE/BANDAGES/DRESSINGS) ×2 IMPLANT
ENDOPOUCH RETRIEVER 10 (MISCELLANEOUS) IMPLANT
GAUZE SPONGE 4X4 12PLY STRL (GAUZE/BANDAGES/DRESSINGS) ×2 IMPLANT
GLOVE BIO SURGEON STRL SZ7.5 (GLOVE) ×10 IMPLANT
GOWN STRL REUS W/ TWL LRG LVL3 (GOWN DISPOSABLE) ×2 IMPLANT
GOWN STRL REUS W/ TWL XL LVL3 (GOWN DISPOSABLE) ×1 IMPLANT
GOWN STRL REUS W/TWL LRG LVL3 (GOWN DISPOSABLE) ×2
GOWN STRL REUS W/TWL XL LVL3 (GOWN DISPOSABLE) ×1
IRRIGATION STRYKERFLOW (MISCELLANEOUS) ×1 IMPLANT
IRRIGATOR STRYKERFLOW (MISCELLANEOUS) ×2
IV NS 1000ML (IV SOLUTION) ×1
IV NS 1000ML BAXH (IV SOLUTION) ×1 IMPLANT
LABEL OR SOLS (LABEL) ×2 IMPLANT
NEEDLE HYPO 25X1 1.5 SAFETY (NEEDLE) ×2 IMPLANT
NS IRRIG 500ML POUR BTL (IV SOLUTION) ×2 IMPLANT
PACK LAP CHOLECYSTECTOMY (MISCELLANEOUS) ×2 IMPLANT
PAD GROUND ADULT SPLIT (MISCELLANEOUS) ×2 IMPLANT
SCISSORS METZENBAUM CVD 33 (INSTRUMENTS) ×2 IMPLANT
SLEEVE ADV FIXATION 5X100MM (TROCAR) ×4 IMPLANT
STRIP CLOSURE SKIN 1/2X4 (GAUZE/BANDAGES/DRESSINGS) ×2 IMPLANT
SUT ETHILON 3-0 FS-10 30 BLK (SUTURE) ×2
SUT VIC AB 0 UR5 27 (SUTURE) ×4 IMPLANT
SUT VIC AB 4-0 FS2 27 (SUTURE) ×2 IMPLANT
SUTURE EHLN 3-0 FS-10 30 BLK (SUTURE) ×1 IMPLANT
SWABSTK COMLB BENZOIN TINCTURE (MISCELLANEOUS) ×2 IMPLANT
SYR 3ML LL SCALE MARK (SYRINGE) ×2 IMPLANT
SYRINGE 10CC LL (SYRINGE) ×2 IMPLANT
TROCAR XCEL BLUNT TIP 100MML (ENDOMECHANICALS) ×2 IMPLANT
TROCAR Z-THREAD OPTICAL 5X100M (TROCAR) ×2 IMPLANT
TUBING INSUFFLATOR HI FLOW (MISCELLANEOUS) ×2 IMPLANT

## 2014-08-16 NOTE — H&P (View-Only) (Signed)
Patient ID: Brianna [name], female   DOB: Dec 10, 1960, 54 y.o.   MRN: [id number]  [facility]:  HPI Location, Quality, Duration, Severity, Timing, Context, Modifying Factors, Associated Signs and Symptoms.  Brianna [name] is a 53 y.o. female.  who was discharged from a local hospital with diagnosis of gallstone pancreatitis. Ultrasound did not demonstrate any evidence of stones obviously. There was hyperemia seen on CT scan. Liver function tests normalize along with lipase. She states that her history started abruptly on July 30 with pain in the epigastrium radiating directly to her back. She had some mild nausea. Symptoms abated in the hospital she was discharged home with follow-up in the surgical office for consideration of cholecystectomy done prophylactically.  Over the last several months she has had difficulty with certain foods causing her pain nausea. There is been no regurgitation. She does have a history of hiatal hernia and diverticulosis diagnosed in 2015. Her only past surgical history is that of a cesarean section via a Pfannenstiel incision.  Past Medical History  Diagnosis Date  . Depression   . Hypertension   . Anxiety   . GERD (gastroesophageal reflux disease)   . Headache   . Arthritis   . Chronic right hip pain since 09/2012  . Pancreatitis   . Chronic neck pain   . Chronic back pain     Past Surgical History  Procedure Laterality Date  . Cesarean section    . Oophorectomy    . Colonoscopy N/A 08/22/2013    OIT:GPQDIYM diverticulosis paper hemtochezia from anorectal irritation  . Esophagogastroduodenoscopy N/A 08/22/2013    EBR:AXENMMH reflux esophagitis/noncritical schatzki's ring and hiatial hernia    Family History  Problem Relation Age of Onset  . Colon cancer Neg Hx   . Ovarian cancer Mother   . Gallbladder disease Son     Social History History  Substance Use Topics  . Smoking status: Former Smoker -- 0.50 packs/day for 2 years   Types: Cigarettes    Quit date: 01/06/2007  . Smokeless tobacco: Never Used  . Alcohol Use: No    Allergies  Allergen Reactions  . Tylenol With Codeine #3 [Acetaminophen-Codeine] Other (See Comments)    headache    Current Outpatient Prescriptions  Medication Sig Dispense Refill  . hydrochlorothiazide (HYDRODIURIL) 25 MG tablet Take 25 mg by mouth daily.    . traMADol (ULTRAM) 50 MG tablet Take 50 mg by mouth every 6 (six) hours as needed.    . citalopram (CELEXA) 40 MG tablet Take 1 tablet (40 mg total) by mouth daily. 30 tablet 3  . dexlansoprazole (DEXILANT) 60 MG capsule Take 1 capsule (60 mg total) by mouth daily. 30 capsule 11  . gabapentin (NEURONTIN) 300 MG capsule Take 1 capsule (300 mg total) by mouth 2 (two) times daily. 60 capsule 1  . lubiprostone (AMITIZA) 24 MCG capsule Take 1 capsule (24 mcg total) by mouth 2 (two) times daily with a meal. (Patient taking differently: Take 24 mcg by mouth 2 (two) times daily as needed for constipation. ) 28 capsule 0   No current facility-administered medications for this visit.     Blood pressure 125/85, pulse 82, temperature 98.3 F (36.8 C), temperature source Oral, height 5\' 3"  (1.6 m), weight 70.035 kg (154 lb 6.4 oz).  No results found for this or any previous visit (from the past 48 hour(s)). No results found.  Review of Systems  Constitutional: Negative for fever, chills and weight loss.  HENT: Negative.  Gastrointestinal: Positive for heartburn, nausea, abdominal pain and constipation. Negative for vomiting and diarrhea.  Genitourinary: Negative for dysuria.  Skin: Negative.   Neurological: Negative for weakness.  Endo/Heme/Allergies: Negative.   All other systems reviewed and are negative.   Physical Exam  Constitutional: She is oriented to person, place, and time and well-developed, well-nourished, and in no distress. No distress.  HENT:  Head: Normocephalic and atraumatic.  Eyes: Conjunctivae are normal.  Pupils are equal, round, and reactive to light.  Neck: Neck supple.  Cardiovascular: Normal rate and normal heart sounds.   Pulmonary/Chest: Effort normal and breath sounds normal. No respiratory distress. She has no wheezes. She has no rales.  Abdominal: Soft. She exhibits no distension and no mass. There is no tenderness. There is no rebound and no guarding.  Neurological: She is oriented to person, place, and time.  Skin: Skin is warm and dry. She is not diaphoretic.  Psychiatric: Mood, memory, affect and judgment normal.    Data Reviewed I personally reviewed the chart from the admission to the outside hospital dated the end of last month. Furthermore ultrasound was personally reviewed. There is no evidence of bile duct dilatation. There is possible pericholecystic fluid seen on CT scan imaging.  I have personally reviewed the patient's imaging, laboratory findings and medical records.    Assessment    54 year old female with biliary pancreatitis. As well as biliary colic.    Plan   I discussed with her laparoscopic cholecystectomy to be performed on Thursday. We discussed the 1 in 200 chance of bile duct injury need for conversion open operation bleeding infection possibility of retained stone and future ERCP. All of her questions were answered. She wishes to proceed.      Sherri Rad MD, FACS 08/14/2014, 11:32 AM

## 2014-08-16 NOTE — Anesthesia Procedure Notes (Signed)
Procedure Name: Intubation Date/Time: 08/16/2014 10:42 AM Performed by: Dionne Bucy Pre-anesthesia Checklist: Patient identified, Patient being monitored, Timeout performed, Emergency Drugs available and Suction available Patient Re-evaluated:Patient Re-evaluated prior to inductionOxygen Delivery Method: Circle system utilized Preoxygenation: Pre-oxygenation with 100% oxygen Intubation Type: IV induction Ventilation: Mask ventilation without difficulty Laryngoscope Size: Mac and 3 Grade View: Grade I Tube type: Oral Tube size: 7.0 mm Number of attempts: 1 Airway Equipment and Method: Stylet Placement Confirmation: ETT inserted through vocal cords under direct vision,  positive ETCO2 and breath sounds checked- equal and bilateral Secured at: 21 cm Tube secured with: Tape Dental Injury: Teeth and Oropharynx as per pre-operative assessment

## 2014-08-16 NOTE — Transfer of Care (Signed)
Immediate Anesthesia Transfer of Care Note  Patient: Brianna [name]  Procedure(s) Performed: Procedure(s): LAPAROSCOPIC CHOLECYSTECTOMY WITH CHOLANGIOGRAM (N/A)  Patient Location: PACU  Anesthesia Type:General  Level of Consciousness: awake and patient cooperative  Airway & Oxygen Therapy: Patient Spontanous Breathing and Patient connected to face mask oxygen  Post-op Assessment: Report given to RN  Post vital signs: Reviewed  Last Vitals: HR 84/BP 123/98/ T 23.3K/122%/ES 14  Complications: No apparent anesthesia complications

## 2014-08-16 NOTE — Progress Notes (Signed)
   08/16/14 1100  Clinical Encounter Type  Visited With Family  Visit Type Spiritual support  Spiritual Encounters  Spiritual Needs Prayer  Stress Factors  Patient Stress Factors Health changes   Status: 77yrs female/Acute respiratory failure with hypoxia Family: mother (wheelchair) and granddaughter Visit Assessment: The chaplain conversed with the family before they went to the cafe. The grandmother shared that her daughter is having her gallbladder removed today. The chaplain gave encouraging words. The patient's health will be lifted up in prayer.  Pastoral Care: [phone] pager or available online by request.

## 2014-08-16 NOTE — Op Note (Signed)
08/16/2014  12:01 PM  PATIENT:  Brianna [name]  54 y.o. female  PRE-OPERATIVE DIAGNOSIS:  GALLSTONES Gallstone pancreatitis Biliary colic  POST-OPERATIVE DIAGNOSIS:  Same.  PROCEDURE:  Procedure(s): LAPAROSCOPIC CHOLECYSTECTOMY WITH CHOLANGIOGRAM (N/A)  SURGEON:  Surgeon(s) and Role:    * Sherri Rad, MD - Primary  ASSISTANTS:  none  ANESTHESIA:  general     SPECIMEN: gallbladder and contents     EBL: minimal  Description of procedure:   With the patient in supine position general endotracheal anesthesia was induced. Her abdomen was widely prepped and draped with chlor prep solution. A timeout was observed.  An infraumbilical transversely oriented skin incision was fashion with scalpel and carried with sharp dissection to the fascia which was incised in the midline and elevated with Coker clamps. The peritoneum was entered sharply.  #000 Vicryl stay suture was passed on either side. Hassan trocar was placed and pneumoperitoneum was established.  The patient was in positioned in reverse Trendelenburg and airplaned right side up. A 5 mm bladeless trocar was placed in the epigastric region 2 5 mm trochars in the right subcostal margin.  The gallbladder was grasped along its fundus and elevated. Lateral traction was achieved on Hartman's pouch. Adhesions of the omentum were taken down with sharp and blunt technique utilizing point cautery along the body of the gallbladder. The hepatoduodenal ligament was then incised utilizing hook cautery apparatus and blunt technique liberating a single anteriorly located cystic artery which was doubly clipped on the portal side singly clipped on the gallbladder side and divided. Kumar clamp was in place and cholangiography was performed. There was a question of a distal filling defect however I think this was an air bubble. The cystic duct was then triply clipped on the portal side singly clipped on the gallbladder side and divided. The gallbladder  was then retrieved off the gallbladder fossa utilizing hook cautery apparatus placed into an Endo Catch device and retrieved. During the retrieval process a 5 mm surgical telescope was inserted into the epigastric region demonstrating no evidence of bowel injury at umbilical trocar site insertion. The peritoneum was then reestablished. The right upper quadrant was irrigated with total of 1 L of warm normal saline and aspirated dry and point hemostasis was achieved in the gallbladder fossa utilizing hook cautery apparatus.  A 19 mm Blake drain was directed into this Morison's pouch and exited the lower most right upper quadrant port site. Drain site was secured with 4-0 nylon suture.  Ports are then removed under direct visualization. The infra umbilical fascial defect been reapproximated with an additional figure-of-eight #000 Vicryl suture in vertical orientation. Skin edges reapproximated utilizing 4-0 Vicryl suture in subcuticular fashion. Benzoin Steri-Strips Telfa and Tegaderm were then applied and the patient was fully extubated and taken to recovery room in stable and satisfactory condition by anesthesia services.  A total of 25 cc of 0.25% plain Marcaine was infiltrated along all skin and fascial incisions prior to closure.

## 2014-08-16 NOTE — Interval H&P Note (Signed)
History and Physical Interval Note:  08/16/2014 10:02 AM  Brianna [name]  has presented today for surgery, with the diagnosis of gallstone  The various methods of treatment have been discussed with the patient and family. After consideration of risks, benefits and other options for treatment, the patient has consented to  Procedure(s): LAPAROSCOPIC CHOLECYSTECTOMY (N/A) as a surgical intervention .  The patient's history has been reviewed, patient examined, no change in status, stable for surgery.  I have reviewed the patient's chart and labs.  Questions were answered to the patient's satisfaction.     Sherri Rad

## 2014-08-16 NOTE — Anesthesia Preprocedure Evaluation (Signed)
Anesthesia Evaluation  Patient identified by MRN, date of birth, ID band Patient awake    Reviewed: Allergy & Precautions, NPO status , Patient's Chart, lab work & pertinent test results  History of Anesthesia Complications Negative for: history of anesthetic complications  Airway Mallampati: II  TM Distance: >3 FB Neck ROM: Full    Dental  (+) Missing, Chipped   Pulmonary former smoker (quit x 5 yrs),          Cardiovascular hypertension, Pt. on medications     Neuro/Psych  Neuromuscular disease (DDD)    GI/Hepatic GERD-  Medicated and Controlled,  Endo/Other    Renal/GU      Musculoskeletal  (+) Arthritis -, Osteoarthritis,    Abdominal   Peds  Hematology  (+) anemia ,   Anesthesia Other Findings   Reproductive/Obstetrics                             Anesthesia Physical Anesthesia Plan  ASA: II  Anesthesia Plan: General   Post-op Pain Management:    Induction: Intravenous  Airway Management Planned: Nasal Cannula  Additional Equipment:   Intra-op Plan:   Post-operative Plan:   Informed Consent: I have reviewed the patients History and Physical, chart, labs and discussed the procedure including the risks, benefits and alternatives for the proposed anesthesia with the patient or authorized representative who has indicated his/her understanding and acceptance.     Plan Discussed with:   Anesthesia Plan Comments:         Anesthesia Quick Evaluation

## 2014-08-16 NOTE — Anesthesia Postprocedure Evaluation (Signed)
  Anesthesia Post-op Note  Patient: Brianna [name]  Procedure(s) Performed: Procedure(s): LAPAROSCOPIC CHOLECYSTECTOMY WITH CHOLANGIOGRAM (N/A)  Anesthesia type:General  Patient location: PACU  Post pain: Pain level controlled  Post assessment: Post-op Vital signs reviewed, Patient's Cardiovascular Status Stable, Respiratory Function Stable, Patent Airway and No signs of Nausea or vomiting  Post vital signs: Reviewed and stable  Last Vitals: There were no vitals filed for this visit.  Level of consciousness: awake, alert  and patient cooperative  Complications: No apparent anesthesia complications

## 2014-08-16 NOTE — Discharge Instructions (Signed)
AMBULATORY SURGERY  °DISCHARGE INSTRUCTIONS ° ° °1) The drugs that you were given will stay in your system until tomorrow so for the next 24 hours you should not: ° °A) Drive an automobile °B) Make any legal decisions °C) Drink any alcoholic beverage ° ° °2) You may resume regular meals tomorrow.  Today it is better to start with liquids and gradually work up to solid foods. ° °You may eat anything you prefer, but it is better to start with liquids, then soup and crackers, and gradually work up to solid foods. ° ° °3) Please notify your doctor immediately if you have any unusual bleeding, trouble breathing, redness and pain at the surgery site, drainage, fever, or pain not relieved by medication. ° ° ° °4) Additional Instructions: ° ° ° ° ° ° ° °Please contact your physician with any problems or Same Day Surgery at [phone], Monday through Friday 6 am to 4 pm, or [place] [facility] [place] Main number at [phone].AMBULATORY SURGERY  °DISCHARGE INSTRUCTIONS ° ° °5) The drugs that you were given will stay in your system until tomorrow so for the next 24 hours you should not: ° °D) Drive an automobile °E) Make any legal decisions °F) Drink any alcoholic beverage ° ° °6) You may resume regular meals tomorrow.  Today it is better to start with liquids and gradually work up to solid foods. ° °You may eat anything you prefer, but it is better to start with liquids, then soup and crackers, and gradually work up to solid foods. ° ° °7) Please notify your doctor immediately if you have any unusual bleeding, trouble breathing, redness and pain at the surgery site, drainage, fever, or pain not relieved by medication. ° ° ° °8) Additional Instructions: ° ° ° ° ° ° ° °Please contact your physician with any problems or Same Day Surgery at [phone], Monday through Friday 6 am to 4 pm, or [place] [facility] [place] Main number at [phone]. °

## 2014-08-17 ENCOUNTER — Telehealth: Payer: Self-pay

## 2014-08-17 LAB — SURGICAL PATHOLOGY

## 2014-08-17 NOTE — Telephone Encounter (Signed)
Patient called in and states that she is still having significant pain and cannot put up with this any longer.   Upon me trying to ask more questions, the phone disconnected on patient's end. I attempted to return phone call to patient but no answer.  Will be glad to speak with patient if she returns phone call.

## 2014-08-20 ENCOUNTER — Ambulatory Visit (INDEPENDENT_AMBULATORY_CARE_PROVIDER_SITE_OTHER): Payer: Self-pay | Admitting: Surgery

## 2014-08-20 ENCOUNTER — Encounter: Payer: Self-pay | Admitting: Surgery

## 2014-08-20 VITALS — BP 127/84 | HR 72 | Temp 98.1°F | Ht 64.0 in | Wt 154.0 lb

## 2014-08-20 DIAGNOSIS — Z09 Encounter for follow-up examination after completed treatment for conditions other than malignant neoplasm: Secondary | ICD-10-CM

## 2014-08-20 MED ORDER — HYDROCODONE-ACETAMINOPHEN 5-325 MG PO TABS: 1.0000 | ORAL_TABLET | ORAL | Status: DC | PRN

## 2014-08-20 MED ORDER — IBUPROFEN 600 MG PO TABS: 600.0000 mg | ORAL_TABLET | Freq: Three times a day (TID) | ORAL | Status: DC

## 2014-08-20 NOTE — Progress Notes (Signed)
Surgery Progress Note  S: Some soreness.  Still requiring pain meds.  Approx 10 cc per day from JP O: Blood pressure 127/84, pulse 72, temperature 98.1 F (36.7 C), temperature source Oral, height 5\' 4"  (1.626 m), weight 154 lb (69.854 kg), last menstrual period 04/14/2012. GEN: NAD/A&Ox3 ABD: soft, min tender, nondistended, incisions c/d/i, JP serosang  A/P 54 yo s/p lap chole, doing well.  Drain removed - given rx for norco and scheduled ibuprofen - f/u prn.

## 2014-08-20 NOTE — Patient Instructions (Signed)
Do not drive on pain medications Do not lift greater than 15 lbs for a period of 6 weeks Call or return to ER if you develop fever greater than 101.5, nausea/vomiting, increased pain, redness/drainage from incisions Take bandages off in 48 hours.  Okay to shower with bandages on or after they come off, no tub baths

## 2014-08-27 ENCOUNTER — Ambulatory Visit: Payer: Self-pay | Admitting: Surgery

## 2014-10-02 ENCOUNTER — Ambulatory Visit: Payer: Self-pay | Admitting: Family Medicine

## 2014-10-08 ENCOUNTER — Other Ambulatory Visit: Payer: Self-pay | Admitting: Internal Medicine

## 2014-10-10 ENCOUNTER — Encounter: Payer: Self-pay | Admitting: Nurse Practitioner

## 2014-10-10 ENCOUNTER — Ambulatory Visit (INDEPENDENT_AMBULATORY_CARE_PROVIDER_SITE_OTHER): Payer: Self-pay | Admitting: Nurse Practitioner

## 2014-10-10 VITALS — BP 109/70 | HR 70 | Temp 97.6°F | Ht 63.0 in | Wt 151.6 lb

## 2014-10-10 DIAGNOSIS — K5900 Constipation, unspecified: Secondary | ICD-10-CM

## 2014-10-10 DIAGNOSIS — R109 Unspecified abdominal pain: Secondary | ICD-10-CM

## 2014-10-10 NOTE — Assessment & Plan Note (Signed)
Abdominal pain with recent hospitalization for gallstone pancreatitis. Pancreatic head is resolved with likely passed stone per hospital physician notes. Patient was referred for elective cholecystectomy which was completed approximately 2 months ago. She is doing well postoperative with no return of abdominal pain. Continue to follow. Return for follow-up in one year.

## 2014-10-10 NOTE — Assessment & Plan Note (Signed)
Constipation symptoms improved on Amitiza. We'll provide her with samples for 2 more weeks while he file for patient assistance with the drug company. Judeen Hammans has assistance 3 them for Dexilant. Return for follow-up in one year.

## 2014-10-10 NOTE — Patient Instructions (Signed)
1. Continue taking Amitiza. 2. We will work on filing for patient assistance for Amitiza. In the meantime we'll give you samples for another couple weeks. 3. Return for follow-up in one year. 4. Keep in mind we will likely have to re-file for patient assistance in January.

## 2014-10-10 NOTE — Progress Notes (Signed)
cc'ed to pcp °

## 2014-10-10 NOTE — Progress Notes (Signed)
Referring Provider: Lance Bosch, NP Primary Care Physician:  Lance Bosch, NP Primary GI:  Dr. Gala Romney  Chief Complaint  Patient presents with  . Follow-up    HPI:   54 year old female presents for follow-up status post elective laparoscopic cholecystectomy. We've previously seen her this year for constipation in trial her on Amitiza. She missed her follow-up appointment due to possible admission for gallstone pancreatitis. She was subsequently seen by [facility] surgical with a laparoscopic cholecystectomy approximately 2 months ago.  Today she states her abdominal pain is much improved postoperatively. She states Amitiza is working well for her. Doesn't need to take every day, but when she does need it, it works well. No adverse effects. Denies hematochezia, melena. Has bowel movement once daily depending on what she eats. Most bowel movements consistent with Bristol 4, if she's having episodic constipation will be Bristol 1, but takes Amitiza and resolves to her satisfaction. Denies chest pain, dyspnea, dizziness, lightheadedness, syncope, near syncope. Denies any other upper or lower GI symptoms.  Past Medical History  Diagnosis Date  . Depression   . Hypertension   . Anxiety   . GERD (gastroesophageal reflux disease)   . Headache   . Arthritis   . Chronic right hip pain since 09/2012  . Pancreatitis   . Chronic neck pain   . Chronic back pain   . Anemia     Past Surgical History  Procedure Laterality Date  . Cesarean section    . Oophorectomy    . Colonoscopy N/A 08/22/2013    EVO:[id number] diverticulosis paper hemtochezia from anorectal irritation  . Esophagogastroduodenoscopy N/A 08/22/2013    HWE:[id number] reflux esophagitis/noncritical schatzki's ring and hiatial hernia  . Cholecystectomy N/A 08/16/2014    Procedure: LAPAROSCOPIC CHOLECYSTECTOMY WITH CHOLANGIOGRAM;  Surgeon: Sherri Rad, MD;  Location: [facility] ORS;  Service: General;  Laterality: N/A;    Current Outpatient  Prescriptions  Medication Sig Dispense Refill  . citalopram (CELEXA) 40 MG tablet Take 1 tablet (40 mg total) by mouth daily. (Patient taking differently: Take 40 mg by mouth every morning. ) 30 tablet 3  . dexlansoprazole (DEXILANT) 60 MG capsule Take 1 capsule (60 mg total) by mouth daily. (Patient taking differently: Take 60 mg by mouth every morning. ) 30 capsule 11  . gabapentin (NEURONTIN) 300 MG capsule Take 1 capsule (300 mg total) by mouth 2 (two) times daily. 60 capsule 1  . hydrochlorothiazide (HYDRODIURIL) 25 MG tablet Take 25 mg by mouth daily.    Marland Kitchen HYDROcodone-acetaminophen (NORCO/VICODIN) 5-325 MG per tablet Take 1 tablet by mouth every 4 (four) hours as needed. 20 tablet 0  . ibuprofen (ADVIL,MOTRIN) 600 MG tablet Take 1 tablet (600 mg total) by mouth 3 (three) times daily. 21 tablet 0  . lubiprostone (AMITIZA) 24 MCG capsule Take 1 capsule (24 mcg total) by mouth 2 (two) times daily with a meal. (Patient taking differently: Take 24 mcg by mouth 2 (two) times daily as needed for constipation. ) 28 capsule 0  . traMADol (ULTRAM) 50 MG tablet Take 50 mg by mouth every 6 (six) hours as needed.    . traMADol (ULTRAM) 50 MG tablet Take 1 tablet (50 mg total) by mouth every 6 (six) hours as needed for moderate pain. 30 tablet 1  . traMADol (ULTRAM) 50 MG tablet Take 1 tablet (50 mg total) by mouth every 8 (eight) hours. (Patient not taking: Reported on 10/10/2014) 60 tablet 0   No current facility-administered medications for this visit.  Allergies as of 10/10/2014 - Review Complete 10/10/2014  Allergen Reaction Noted  . Tylenol with codeine #3 [acetaminophen-codeine] Other (See Comments) 05/24/2014    Family History  Problem Relation Age of Onset  . Colon cancer Neg Hx   . Ovarian cancer Mother   . Gallbladder disease Son     Social History   Social History  . Marital Status: Widowed    Spouse Name: N/A  . Number of Children: N/A  . Years of Education: N/A    Occupational History  .      hasn't worked since October 2014. Filing for disability   Social History Main Topics  . Smoking status: Former Smoker -- 0.50 packs/day for 2 years    Types: Cigarettes    Quit date: 01/06/2007  . Smokeless tobacco: Never Used  . Alcohol Use: No  . Drug Use: No  . Sexual Activity: Not Asked   Other Topics Concern  . None   Social History Narrative    Review of Systems: General: Negative for anorexia, weight loss, fever, chills, fatigue, weakness. CV: Negative for chest pain, angina, palpitations, peripheral edema.  Respiratory: Negative for dyspnea at rest, cough, sputum, wheezing.  GI: See history of present illness. Endo: Negative for unusual weight change.  Heme: Negative for bruising or bleeding.   Physical Exam: BP 109/70 mmHg  Pulse 70  Temp(Src) 97.6 F (36.4 C) (Oral)  Ht 5\' 3"  (1.6 m)  Wt 151 lb 9.6 oz (68.765 kg)  BMI 26.86 kg/m2  LMP 04/14/2012 General:   Alert and oriented. Pleasant and cooperative. Well-nourished and well-developed.  Head:  Normocephalic and atraumatic. Eyes:  Without icterus, sclera clear and conjunctiva pink.  Ears:  Normal auditory acuity. Cardiovascular:  S1, S2 present without murmurs appreciated. Normal pulses noted. Extremities without clubbing or edema. Respiratory:  Clear to auscultation bilaterally. No wheezes, rales, or rhonchi. No distress.  Gastrointestinal:  +BS, soft, non-tender and non-distended. No HSM noted. No guarding or rebound. No masses appreciated.  Rectal:  Deferred  Skin:  Intact without significant lesions or rashes. Anterior abdominal scars from lap chole noted, well healed. Neurologic:  Alert and oriented x4;  grossly normal neurologically. Psych:  Alert and cooperative. Normal mood and affect.    10/10/2014 9:28 AM

## 2014-10-10 NOTE — Telephone Encounter (Signed)
Patient called requesting for medication (citalopram (CELEXA) 40 MG tablet)  to be send to the [facility] in [place]. She would also like to check if Dr. Feliciana Rossetti took over her hydrochlorothiazide (HYDRODIURIL) 25 MG tablet  prescription as well. Patient is completely out of these medications. Please f/u with patient ASAP.

## 2014-10-11 NOTE — Telephone Encounter (Signed)
Patient called to request med refill for her medications. Please f/u with pt.

## 2014-10-11 NOTE — Telephone Encounter (Signed)
Patient called requesting a med refill on citalopram (CELEXA) 40 MG tablet, and hydrochlorothiazide (HYDRODIURIL) 25 MG tablet. Please f/u with pt.

## 2014-10-20 ENCOUNTER — Emergency Department (HOSPITAL_COMMUNITY)
Admission: EM | Admit: 2014-10-20 | Discharge: 2014-10-20 | Disposition: A | Discharge status: Home / Self Care | Payer: Medicaid Other | Attending: Emergency Medicine | Admitting: Emergency Medicine

## 2014-10-20 ENCOUNTER — Encounter (HOSPITAL_COMMUNITY): Payer: Self-pay | Admitting: Emergency Medicine

## 2014-10-20 DIAGNOSIS — G8929 Other chronic pain: Secondary | ICD-10-CM | POA: Insufficient documentation

## 2014-10-20 DIAGNOSIS — M1990 Unspecified osteoarthritis, unspecified site: Secondary | ICD-10-CM | POA: Diagnosis not present

## 2014-10-20 DIAGNOSIS — R110 Nausea: Secondary | ICD-10-CM | POA: Diagnosis not present

## 2014-10-20 DIAGNOSIS — R61 Generalized hyperhidrosis: Secondary | ICD-10-CM | POA: Insufficient documentation

## 2014-10-20 DIAGNOSIS — Z87891 Personal history of nicotine dependence: Secondary | ICD-10-CM | POA: Diagnosis not present

## 2014-10-20 DIAGNOSIS — K219 Gastro-esophageal reflux disease without esophagitis: Secondary | ICD-10-CM | POA: Diagnosis not present

## 2014-10-20 DIAGNOSIS — F419 Anxiety disorder, unspecified: Secondary | ICD-10-CM | POA: Insufficient documentation

## 2014-10-20 DIAGNOSIS — K5900 Constipation, unspecified: Secondary | ICD-10-CM | POA: Insufficient documentation

## 2014-10-20 DIAGNOSIS — Z79899 Other long term (current) drug therapy: Secondary | ICD-10-CM | POA: Insufficient documentation

## 2014-10-20 DIAGNOSIS — F329 Major depressive disorder, single episode, unspecified: Secondary | ICD-10-CM | POA: Insufficient documentation

## 2014-10-20 DIAGNOSIS — Z862 Personal history of diseases of the blood and blood-forming organs and certain disorders involving the immune mechanism: Secondary | ICD-10-CM | POA: Insufficient documentation

## 2014-10-20 DIAGNOSIS — R109 Unspecified abdominal pain: Secondary | ICD-10-CM | POA: Diagnosis present

## 2014-10-20 DIAGNOSIS — Z9049 Acquired absence of other specified parts of digestive tract: Secondary | ICD-10-CM | POA: Insufficient documentation

## 2014-10-20 DIAGNOSIS — R1013 Epigastric pain: Secondary | ICD-10-CM | POA: Insufficient documentation

## 2014-10-20 DIAGNOSIS — R630 Anorexia: Secondary | ICD-10-CM | POA: Diagnosis not present

## 2014-10-20 DIAGNOSIS — R1010 Upper abdominal pain, unspecified: Secondary | ICD-10-CM

## 2014-10-20 DIAGNOSIS — R51 Headache: Secondary | ICD-10-CM | POA: Diagnosis not present

## 2014-10-20 LAB — CBC
HCT: 36.5 % (ref 36.0–46.0)
Hemoglobin: 12.3 g/dL (ref 12.0–15.0)
MCH: 29.1 pg (ref 26.0–34.0)
MCHC: 33.7 g/dL (ref 30.0–36.0)
MCV: 86.5 fL (ref 78.0–100.0)
Platelets: 296 10*3/uL (ref 150–400)
RBC: 4.22 MIL/uL (ref 3.87–5.11)
RDW: 12.4 % (ref 11.5–15.5)
WBC: 6.4 10*3/uL (ref 4.0–10.5)

## 2014-10-20 LAB — COMPREHENSIVE METABOLIC PANEL
ALT: 22 U/L (ref 14–54)
AST: 40 U/L (ref 15–41)
Albumin: 4 g/dL (ref 3.5–5.0)
Alkaline Phosphatase: 81 U/L (ref 38–126)
Anion gap: 3 — ABNORMAL LOW (ref 5–15)
BUN: 16 mg/dL (ref 6–20)
CHLORIDE: 104 mmol/L (ref 101–111)
CO2: 30 mmol/L (ref 22–32)
Calcium: 9.3 mg/dL (ref 8.9–10.3)
Creatinine, Ser: 0.9 mg/dL (ref 0.44–1.00)
GFR calc non Af Amer: >60 mL/min (ref >60)
Glucose, Bld: 93 mg/dL (ref 65–99)
POTASSIUM: 3.6 mmol/L (ref 3.5–5.1)
SODIUM: 137 mmol/L (ref 135–145)
Total Bilirubin: 0.6 mg/dL (ref 0.3–1.2)
Total Protein: 7.2 g/dL (ref 6.5–8.1)

## 2014-10-20 LAB — URINALYSIS, ROUTINE W REFLEX MICROSCOPIC
Bilirubin Urine: NEGATIVE
Glucose, UA: NEGATIVE mg/dL
Hgb urine dipstick: NEGATIVE
Ketones, ur: NEGATIVE mg/dL
LEUKOCYTES UA: NEGATIVE
Nitrite: NEGATIVE
PH: 7.5 (ref 5.0–8.0)
Protein, ur: NEGATIVE mg/dL
SPECIFIC GRAVITY, URINE: 1.015 (ref 1.005–1.030)
Urobilinogen, UA: 0.2 mg/dL (ref 0.0–1.0)

## 2014-10-20 LAB — LIPASE, BLOOD: Lipase: 19 U/L — ABNORMAL LOW (ref 22–51)

## 2014-10-20 MED ORDER — FENTANYL CITRATE (PF) 100 MCG/2ML IJ SOLN
50.0000 ug | Freq: Once | INTRAMUSCULAR | Status: AC
  Administered 2014-10-20: 50 ug via INTRAVENOUS
  Filled 2014-10-20: qty 2

## 2014-10-20 NOTE — ED Notes (Signed)
Patient states unable to void at this time.

## 2014-10-20 NOTE — ED Provider Notes (Signed)
CSN: [id number]     Arrival date & time 10/20/14  1110 History  By signing my name below, I, Brianna [name], attest that this documentation has been prepared under the direction and in the presence of Brianna Belling, MD. Electronically Signed: Stephania [name], ED Scribe. 10/20/2014. 11:57 AM.   Chief Complaint  Patient presents with  . Abdominal Pain   The history is provided by the patient. No language interpreter was used.   HPI Comments: Brianna [name] is a 54 y.o. female who presents to the Emergency Department complaining of intermittent episodes of sore abdominal pain that began when she woke up this morning. Associated symptoms include nausea, diaphoresis, constipation, and loss of appetite. Patient reports that she was hospitalized for pancreatitis and had a cholecystectomy done about 2 months ago. Patient reports she is unable to specify whether the pain is more similar to pancreatitis or diverticulitis. Patient states Tylenol #3 causes her to have headaches but otherwise has NKDA.  Past Medical History  Diagnosis Date  . Depression   . Hypertension   . Anxiety   . GERD (gastroesophageal reflux disease)   . Headache   . Arthritis   . Chronic right hip pain since 09/2012  . Pancreatitis   . Chronic neck pain   . Chronic back pain   . Anemia    Past Surgical History  Procedure Laterality Date  . Cesarean section    . Oophorectomy    . Colonoscopy N/A 08/22/2013    CHE:NIDPOEU diverticulosis paper hemtochezia from anorectal irritation  . Esophagogastroduodenoscopy N/A 08/22/2013    MPN:TIRWERX reflux esophagitis/noncritical schatzki's ring and hiatial hernia  . Cholecystectomy N/A 08/16/2014    Procedure: LAPAROSCOPIC CHOLECYSTECTOMY WITH CHOLANGIOGRAM;  Surgeon: Sherri Rad, MD;  Location: [facility] ORS;  Service: General;  Laterality: N/A;   Family History  Problem Relation Age of Onset  . Colon cancer Neg Hx   . Ovarian cancer Mother   . Gallbladder disease Son    Social  History  Substance Use Topics  . Smoking status: Former Smoker -- 0.50 packs/day for 2 years    Types: Cigarettes    Quit date: 01/06/2007  . Smokeless tobacco: Never Used  . Alcohol Use: No   OB History    Gravida Para Term Preterm AB TAB SAB Ectopic Multiple Living   4 4 4       4      Review of Systems  Constitutional: Positive for diaphoresis and appetite change.  Gastrointestinal: Positive for nausea, abdominal pain and constipation.  All other systems reviewed and are negative.     Allergies  Tylenol with codeine #3  Home Medications   Prior to Admission medications   Medication Sig Start Date End Date Taking? Authorizing Provider  citalopram (CELEXA) 40 MG tablet TAKE ONE TABLET BY MOUTH ONCE DAILY 10/15/14  Yes Lance Bosch, NP  dexlansoprazole (DEXILANT) 60 MG capsule Take 1 capsule (60 mg total) by mouth daily. Patient taking differently: Take 60 mg by mouth every morning.  05/03/14  Yes Orvil Feil, NP  gabapentin (NEURONTIN) 300 MG capsule Take 1 capsule (300 mg total) by mouth 2 (two) times daily. 06/07/14  Yes Deanna M Didiano, DO  hydrochlorothiazide (HYDRODIURIL) 25 MG tablet TAKE ONE TABLET BY MOUTH ONCE DAILY 10/15/14  Yes Lance Bosch, NP  lubiprostone (AMITIZA) 24 MCG capsule Take 1 capsule (24 mcg total) by mouth 2 (two) times daily with a meal. Patient taking differently: Take 24 mcg by mouth 2 (two)  times daily as needed for constipation.  05/07/14  Yes Carlis Stable, NP  traMADol (ULTRAM) 50 MG tablet Take 1 tablet (50 mg total) by mouth every 6 (six) hours as needed for moderate pain. 08/16/14  Yes Sherri Rad, MD  HYDROcodone-acetaminophen (NORCO/VICODIN) 5-325 MG per tablet Take 1 tablet by mouth every 4 (four) hours as needed. Patient not taking: Reported on 10/20/2014 08/20/14   Marlyce Huge, MD  ibuprofen (ADVIL,MOTRIN) 600 MG tablet Take 1 tablet (600 mg total) by mouth 3 (three) times daily. Patient not taking: Reported on 10/20/2014 08/20/14    Marlyce Huge, MD  traMADol (ULTRAM) 50 MG tablet Take 1 tablet (50 mg total) by mouth every 8 (eight) hours. Patient not taking: Reported on 10/10/2014 08/16/14   Carlos Levering Draper, DO   BP 95/61 mmHg  Pulse 63  Temp(Src) 97.8 F (36.6 C) (Oral)  Resp 14  Ht 5' 3.5" (1.613 m)  Wt 140 lb (63.504 kg)  BMI 24.41 kg/m2  SpO2 97%  LMP 04/14/2012 Physical Exam  Constitutional: She is oriented to person, place, and time. She appears well-developed and well-nourished. No distress.  HENT:  Head: Normocephalic and atraumatic.  Eyes: Conjunctivae and EOM are normal.  Neck: Neck supple. No tracheal deviation present.  Cardiovascular: Normal rate.   Pulmonary/Chest: Effort normal. No respiratory distress.  Abdominal: There is tenderness.  Mild epigastric tenderness.  Musculoskeletal: Normal range of motion.  Neurological: She is alert and oriented to person, place, and time.  Skin: Skin is warm and dry.  Psychiatric: She has a normal mood and affect. Her behavior is normal.  Nursing note and vitals reviewed.   ED Course  Procedures (including critical care time)  DIAGNOSTIC STUDIES: Oxygen Saturation is 100% on RA, normal by my interpretation.    COORDINATION OF CARE: 11:47 AM - Discussed treatment plan with pt at bedside which includes bloodwork and imaging to r/o pancreatitis and/or diverticulitis. Pt verbalized understanding and agreed to plan.   Labs Review Labs Reviewed  LIPASE, BLOOD - Abnormal; Notable for the following:    Lipase 19 (*)    All other components within normal limits  COMPREHENSIVE METABOLIC PANEL - Abnormal; Notable for the following:    Anion gap 3 (*)    All other components within normal limits  URINALYSIS, ROUTINE W REFLEX MICROSCOPIC (NOT AT [facility]) - Abnormal; Notable for the following:    APPearance HAZY (*)    All other components within normal limits  CBC    Imaging Review No results found. I have personally reviewed and evaluated these  images and lab results as part of my medical decision-making.   EKG Interpretation   Date/Time:  Saturday October 20 2014 11:25:23 EDT Ventricular Rate:  64 PR Interval:  159 QRS Duration: 97 QT Interval:  449 QTC Calculation: 463 R Axis:   0 Text Interpretation:  Sinus rhythm Low voltage, precordial leads Confirmed  by Alvino Chapel  MD, Ovid Curd [phone]) on 10/20/2014 12:30:02 PM      MDM   Final diagnoses:  Pain of upper abdomen   Patient with abdominal pain. Has had history of same and has had pancreatitis in the past. Has had cholecystectomy. Lab work overall reassuring. Lipase is not elevated. Feels better after treatment. Also has a headache. Will discharge home. I, Brianna [name] R., personally performed the services described in this documentation. All medical record entries made by the scribe were at my direction and in my presence.  I have reviewed the chart and discharge instructions  and agree that the record reflects my personal performance and is accurate and complete. Areona Homer R..  10/20/2014. 2:49 PM.      Brianna Belling, MD 10/20/14 [phone]

## 2014-10-20 NOTE — Discharge Instructions (Signed)

## 2014-10-20 NOTE — ED Notes (Signed)
Pt reports chest and abd pain since this morning.  Pt has diverticulitis. Pt was in hospital for pancreatitis a month ago and gall bladder was removed.  Pt reports nausea.

## 2014-10-23 ENCOUNTER — Encounter: Payer: Self-pay | Admitting: Family Medicine

## 2014-10-23 ENCOUNTER — Ambulatory Visit (INDEPENDENT_AMBULATORY_CARE_PROVIDER_SITE_OTHER): Payer: Self-pay | Admitting: Family Medicine

## 2014-10-23 VITALS — BP 109/62 | HR 85 | Ht 63.0 in | Wt 150.0 lb

## 2014-10-23 DIAGNOSIS — M5412 Radiculopathy, cervical region: Secondary | ICD-10-CM

## 2014-10-23 DIAGNOSIS — S46091A Other injury of muscle(s) and tendon(s) of the rotator cuff of right shoulder, initial encounter: Secondary | ICD-10-CM

## 2014-10-23 MED ORDER — TRAMADOL HCL 50 MG PO TABS: 50.0000 mg | ORAL_TABLET | Freq: Three times a day (TID) | ORAL | Status: DC | PRN

## 2014-10-23 MED ORDER — METHYLPREDNISOLONE ACETATE 40 MG/ML IJ SUSP
40.0000 mg | Freq: Once | INTRAMUSCULAR | Status: AC
  Administered 2014-10-23: 40 mg via INTRA_ARTICULAR

## 2014-10-23 NOTE — Progress Notes (Signed)
Brianna [name] - 54 y.o. female MRN [id number]  Date of birth: 12/10/1960  CC: Right shoulder and neck pain.   SUBJECTIVE:   HPI 54 yo with multiple medical issues including depression/anxiety, fibromyalgia, and a recent hospitalization for gallstone pancreatitis s/p lap chole who presents to follow up on her right shoulder and RUE radiculopathy.   Right shoulder pain:  - Subacromial injection performed at last visit helped her pain considerably for 5-6 months. This was in fact her 2nd SA injection after her pain returned due to a fall.  - Pain began returning 3-4 weeks ago.  - Tramadol is continuing to give her some relief.    R arm pain: - Pain radiating from neck down to b/l hand.  - Epidural injection on 06/11/2014. She has had 50% relief from this.   - She is still having occasional numbness.  - She is still taking tramadol and gabapentin, which do provide some relief.  - She is not wearing the cervical collar - She reports she is unable to go to PT.   06/02/14 MRI cervical spine: Notable C5-C6 spondylosis with disc deformity of the ventral cord causing moderate to severe bilateral foraminal narrowing, moderate bilateral foraminal narrowing at C4-C5/ C6-C7, and moderate to severe foraminal narrowing on the left C3-C4  ROS:     10 point RoS negative other than that listed in HPI.   HISTORY: Past Medical, Surgical, Social, and Family History Reviewed & Updated per EMR.    OBJECTIVE: BP 109/62 mmHg  Pulse 85  Ht 5\' 3"  (1.6 m)  Wt 150 lb (68.04 kg)  BMI 26.58 kg/m2  LMP 04/14/2012  Physical Exam  Calm, no distress.  Non-labored breathing.  Skin of UE: warm; dry, no rashes, lesions, ecchymosis or erythema. Vascular: radial pulses 2+ bilaterally Palpation: No boney tenderness, but tenderness throughout her paracervical musculature.  Range of motion: Decreased range of motion in neck flexion and extension. Decreased range of motion in neck rotation.  Special tests: Positive  Spurling sign on the right with radicular symptoms propagated down the right arm Motor and sensory: Neurovascularly intact other than diminished sensation throughout the RUE.   Shoulder: right Inspection reveals no abnormalities, atrophy or asymmetry. Palpation is normal with no tenderness over AC joint or bicipital groove. ROM is now relatively good. Able to reach 120 with flexion and abduction.   Rotator cuff strength normal throughout. + signs of impingement with + Neer and Hawkin's tests. Speeds and Yergason's tests normal. Normal scapular function observed. No painful arc and no drop arm sign. No apprehension sign  MEDICATIONS, LABS & OTHER ORDERS: Previous Medications   CITALOPRAM (CELEXA) 40 MG TABLET    TAKE ONE TABLET BY MOUTH ONCE DAILY   DEXLANSOPRAZOLE (DEXILANT) 60 MG CAPSULE    Take 1 capsule (60 mg total) by mouth daily.   GABAPENTIN (NEURONTIN) 300 MG CAPSULE    Take 1 capsule (300 mg total) by mouth 2 (two) times daily.   HYDROCHLOROTHIAZIDE (HYDRODIURIL) 25 MG TABLET    TAKE ONE TABLET BY MOUTH ONCE DAILY   HYDROCODONE-ACETAMINOPHEN (NORCO/VICODIN) 5-325 MG PER TABLET    Take 1 tablet by mouth every 4 (four) hours as needed.   IBUPROFEN (ADVIL,MOTRIN) 600 MG TABLET    Take 1 tablet (600 mg total) by mouth 3 (three) times daily.   LUBIPROSTONE (AMITIZA) 24 MCG CAPSULE    Take 1 capsule (24 mcg total) by mouth 2 (two) times daily with a meal.   TRAMADOL (ULTRAM) 50 MG  TABLET    Take 1 tablet (50 mg total) by mouth every 8 (eight) hours.   TRAMADOL (ULTRAM) 50 MG TABLET    Take 1 tablet (50 mg total) by mouth every 6 (six) hours as needed for moderate pain.   Modified Medications   No medications on file   New Prescriptions   No medications on file   Discontinued Medications   No medications on file  No orders of the defined types were placed in this encounter.   ASSESSMENT & PLAN: Injection procedure: Consent obtained and verified. Sterile betadine prep. Furthur  cleansed with alcohol and betadine. Topical analgesic spray: Ethyl chloride. Injection Indication: Rotator cuff tendonitis of the right shoulder Approached in typical fashion with: Posterior lateral subacromial injection Meds: 1 mL of 40 mg Depo-Medrol and 3 mL of lidocaine Needle: 1-1/2 inch 25-gauge Completed without difficulty Aftercare instructions and Red flags advised. Advised to call if fevers/chills, erythema, induration, drainage, or persistent bleeding.   See problem based charting & AVS for pt instructions.

## 2014-10-25 NOTE — Assessment & Plan Note (Signed)
Multilevel cervical degenerative disc disease with foraminal narrowing causing cervical radiculopathy, now with 50% improvement after initial ESI. Has declined PT. Restricted financially.  Considering her good response to the injection, we will refer her for a 2nd ESI.   Continue with Ultram and gabapentin. Recommended use of the cervical collar when needed.  - f/u 2-3 months. Call with any questions.

## 2014-10-25 NOTE — Assessment & Plan Note (Signed)
Great response from subacromial injection 8 months ago. Will repeat today.  Completed without difficulty.  Discussed doing RC rehab exercises. Good RoM making it seem as though whatever resstriction she had previously has resolved.

## 2014-12-04 ENCOUNTER — Ambulatory Visit: Payer: Medicaid Other | Attending: Internal Medicine | Admitting: Internal Medicine

## 2014-12-04 ENCOUNTER — Other Ambulatory Visit: Payer: Self-pay | Admitting: Internal Medicine

## 2014-12-04 ENCOUNTER — Encounter (HOSPITAL_BASED_OUTPATIENT_CLINIC_OR_DEPARTMENT_OTHER): Payer: Self-pay | Admitting: Clinical

## 2014-12-04 ENCOUNTER — Encounter: Payer: Self-pay | Admitting: Internal Medicine

## 2014-12-04 VITALS — BP 115/75 | HR 75 | Temp 97.9°F | Resp 18 | Ht 63.0 in | Wt 152.0 lb

## 2014-12-04 DIAGNOSIS — F329 Major depressive disorder, single episode, unspecified: Secondary | ICD-10-CM

## 2014-12-04 DIAGNOSIS — M25551 Pain in right hip: Secondary | ICD-10-CM | POA: Diagnosis present

## 2014-12-04 DIAGNOSIS — K219 Gastro-esophageal reflux disease without esophagitis: Secondary | ICD-10-CM | POA: Insufficient documentation

## 2014-12-04 DIAGNOSIS — Z79899 Other long term (current) drug therapy: Secondary | ICD-10-CM | POA: Diagnosis not present

## 2014-12-04 DIAGNOSIS — I10 Essential (primary) hypertension: Secondary | ICD-10-CM | POA: Diagnosis not present

## 2014-12-04 DIAGNOSIS — F32A Depression, unspecified: Secondary | ICD-10-CM

## 2014-12-04 DIAGNOSIS — F418 Other specified anxiety disorders: Secondary | ICD-10-CM

## 2014-12-04 DIAGNOSIS — F419 Anxiety disorder, unspecified: Principal | ICD-10-CM

## 2014-12-04 MED ORDER — CITALOPRAM HYDROBROMIDE 40 MG PO TABS: 40.0000 mg | ORAL_TABLET | Freq: Every day | ORAL | Status: DC

## 2014-12-04 MED ORDER — HYDROCHLOROTHIAZIDE 25 MG PO TABS: 25.0000 mg | ORAL_TABLET | Freq: Every day | ORAL | Status: DC

## 2014-12-04 NOTE — Progress Notes (Signed)
Patient here for pain all over.  Patient complains of pain in multiple sites due to multiple conditions. Patient complains of the right side of her hip at this current time. Patient scales pain at a 10. Pain is described as sharp, aching pains all over.   Patient has taken medications today and patient has eaten.

## 2014-12-04 NOTE — Progress Notes (Signed)
ASSESSMENT: Pt currently experiencing symptoms of anxiety and depression. Pt needs to f/u with PCP and [facility]; would benefit from community resources and supportive counseling regarding coping with symptoms of anxiety and depression.  Stage of Change: contemplative  PLAN: 1. F/U with behavioral health consultant in as needed 2. Psychiatric Medications: Celexa 3. Behavioral recommendation(s):   -Consider connection between pain and symptoms of anxiety and depression -Consider making rice sock, along with heating pads, for temporary pain relief -Consider reading educational material regarding coping with symptoms of chronic pain, anxiety and depression SUBJECTIVE: Pt. referred by Chari Manning for symptoms of anxiety and depression:  Pt. reports the following symptoms/concerns: Pt states that the pain she is feeling is causing her to feel anxiety and depression, that she is going to disability hearing this coming week with a lawyer; she has been turned down twice for disability. There is little that helps ease the pain other than pain medication and a heating pad.  Duration of problem: Increasing over past few weeks Severity: severe  OBJECTIVE: Orientation & Cognition: Oriented x3. Thought processes normal and appropriate to situation. Mood: appropriate. Affect: appropriate Appearance: appropriate Risk of harm to self or others: no known risk of harm to self or others Substance use: none Assessments administered: PHQ9: 212/ GAD7: 17  Diagnosis: Anxiety and depression CPT Code: F41.8 -------------------------------------------- Other(s) present in the room: none  Time spent with patient in exam room: 16 minutes

## 2014-12-04 NOTE — Progress Notes (Signed)
Patient ID: Brianna [name], female   DOB: 09-29-1960, 54 y.o.   MRN: [id number] Subjective:  Brianna [name] is a 54 y.o. female with hypertension. She has a past medical history of pancreatitis, GERD, constipation, and lumbar disc disease. Patient reports that she has been having right hip pain. She states that she has been seen by Orthopedics several times for injections in her rotator cuff and spine. She states that she was treated for bursitis in that same hip but she has not had any improvement since treatment. She is upset because she has only been given Tramadol for pain and it is not helping her take the edge off her pain. Patient reports that pain is aggravated by any movement of the right leg and feels like something is pulling.  Current Outpatient Prescriptions  Medication Sig Dispense Refill  . citalopram (CELEXA) 40 MG tablet TAKE ONE TABLET BY MOUTH ONCE DAILY 30 tablet 0  . dexlansoprazole (DEXILANT) 60 MG capsule Take 1 capsule (60 mg total) by mouth daily. (Patient taking differently: Take 60 mg by mouth every morning. ) 30 capsule 11  . gabapentin (NEURONTIN) 300 MG capsule Take 1 capsule (300 mg total) by mouth 2 (two) times daily. 60 capsule 1  . hydrochlorothiazide (HYDRODIURIL) 25 MG tablet TAKE ONE TABLET BY MOUTH ONCE DAILY 30 tablet 0  . ibuprofen (ADVIL,MOTRIN) 600 MG tablet Take 1 tablet (600 mg total) by mouth 3 (three) times daily. 21 tablet 0  . lubiprostone (AMITIZA) 24 MCG capsule Take 1 capsule (24 mcg total) by mouth 2 (two) times daily with a meal. (Patient taking differently: Take 24 mcg by mouth 2 (two) times daily as needed for constipation. ) 28 capsule 0  . traMADol (ULTRAM) 50 MG tablet Take 1 tablet (50 mg total) by mouth every 8 (eight) hours as needed. 60 tablet 1  . HYDROcodone-acetaminophen (NORCO/VICODIN) 5-325 MG per tablet Take 1 tablet by mouth every 4 (four) hours as needed. (Patient not taking: Reported on 12/04/2014) 20 tablet 0   No current  facility-administered medications for this visit.    Hypertension ROS: taking medications as instructed, no medication side effects noted, no TIA's, no chest pain on exertion, no dyspnea on exertion, no swelling of ankles and no palpitations.   Objective:  BP 115/75 mmHg  Pulse 75  Temp(Src) 97.9 F (36.6 C) (Oral)  Resp 18  Ht 5\' 3"  (1.6 m)  Wt 152 lb (68.947 kg)  BMI 26.93 kg/m2  SpO2 99%  LMP 04/14/2012  Appearance alert, well appearing, and in no distress, oriented to person, place, and time and normal appearing weight. General exam BP noted to be well controlled today in office, S1, S2 normal, no gallop, no murmur, chest clear, no JVD, no HSM, no edema. Pain with PROM of leg hip. No tenderness to palpitation.  Lab review: labs are reviewed, up to date and normal.   Assessment:   Brianna [name] was seen today for hip pain.  Diagnoses and all orders for this visit:  Essential hypertension -     hydrochlorothiazide (HYDRODIURIL) 25 MG tablet; Take 1 tablet (25 mg total) by mouth daily. Patient blood pressure is stable and may continue on current medication.  Education on diet, exercise, and modifiable risk factors discussed. Will obtain appropriate labs as needed. Will follow up in 3-6 months.   Depression -     citalopram (CELEXA) 40 MG tablet; Take 1 tablet (40 mg total) by mouth daily. Patient feels like much of her depression  is from pain. She is currently stable, meds refilled.   Right hip pain I encouraged patient to apply for Affordable care act so that she may get ortho procedures and pain clinic referral.    Return in about 6 months (around 06/03/2015) for Hypertension.    Lance Bosch, NP 12/04/2014 4:37 PM

## 2014-12-26 ENCOUNTER — Telehealth: Payer: Self-pay | Admitting: *Deleted

## 2014-12-26 NOTE — Telephone Encounter (Signed)
Spoke with patient and she wants to be seen at [facility] pain management in [place]. Their office is booking into Feb and March and will need [place] to fax our notes and referral over and then will determine if they can see the patient.  I informed the patient about this and she said its fine to wait and see.  If she needs something sooner she will call us back or find somewhere else.

## 2015-01-09 ENCOUNTER — Encounter: Payer: Self-pay | Admitting: Family Medicine

## 2015-01-09 ENCOUNTER — Encounter (INDEPENDENT_AMBULATORY_CARE_PROVIDER_SITE_OTHER): Payer: Self-pay

## 2015-01-09 ENCOUNTER — Ambulatory Visit (INDEPENDENT_AMBULATORY_CARE_PROVIDER_SITE_OTHER): Payer: Medicaid Other | Admitting: Family Medicine

## 2015-01-09 VITALS — BP 111/81 | HR 83 | Temp 98.5°F | Ht 63.5 in | Wt 151.8 lb

## 2015-01-09 DIAGNOSIS — F418 Other specified anxiety disorders: Secondary | ICD-10-CM

## 2015-01-09 DIAGNOSIS — M5116 Intervertebral disc disorders with radiculopathy, lumbar region: Secondary | ICD-10-CM

## 2015-01-09 DIAGNOSIS — M5010 Cervical disc disorder with radiculopathy, unspecified cervical region: Secondary | ICD-10-CM

## 2015-01-09 DIAGNOSIS — J069 Acute upper respiratory infection, unspecified: Secondary | ICD-10-CM

## 2015-01-09 DIAGNOSIS — F329 Major depressive disorder, single episode, unspecified: Secondary | ICD-10-CM | POA: Insufficient documentation

## 2015-01-09 DIAGNOSIS — F419 Anxiety disorder, unspecified: Secondary | ICD-10-CM

## 2015-01-09 DIAGNOSIS — F32A Depression, unspecified: Secondary | ICD-10-CM | POA: Insufficient documentation

## 2015-01-09 MED ORDER — LORATADINE 10 MG PO TABS
10.0000 mg | ORAL_TABLET | Freq: Every day | ORAL | Status: DC
Start: 2015-01-09 — End: 2016-01-25

## 2015-01-09 MED ORDER — VENLAFAXINE HCL ER 37.5 MG PO CP24: 37.5000 mg | ORAL_CAPSULE | Freq: Every day | ORAL | Status: DC

## 2015-01-09 MED ORDER — GABAPENTIN 300 MG PO CAPS: 300.0000 mg | ORAL_CAPSULE | Freq: Three times a day (TID) | ORAL | Status: DC

## 2015-01-09 MED ORDER — LUBIPROSTONE 24 MCG PO CAPS: 24.0000 ug | ORAL_CAPSULE | Freq: Two times a day (BID) | ORAL | Status: DC | PRN

## 2015-01-09 MED ORDER — TRAMADOL HCL 50 MG PO TABS: 50.0000 mg | ORAL_TABLET | Freq: Three times a day (TID) | ORAL | Status: DC | PRN

## 2015-01-09 MED ORDER — FLUTICASONE PROPIONATE 50 MCG/ACT NA SUSP: 1.0000 | Freq: Two times a day (BID) | NASAL | Status: DC | PRN

## 2015-01-09 MED ORDER — IBUPROFEN 600 MG PO TABS: 600.0000 mg | ORAL_TABLET | Freq: Three times a day (TID) | ORAL | Status: DC

## 2015-01-09 MED ORDER — DEXLANSOPRAZOLE 60 MG PO CPDR: 60.0000 mg | DELAYED_RELEASE_CAPSULE | Freq: Every day | ORAL | Status: DC

## 2015-01-09 NOTE — Progress Notes (Signed)
BP 111/81 mmHg  Pulse 83  Temp(Src) 98.5 F (36.9 C) (Oral)  Ht 5' 3.5" (1.613 m)  Wt 151 lb 12.8 oz (68.856 kg)  BMI 26.47 kg/m2  LMP 04/14/2012   Subjective:    Patient ID: Brianna [name], female    DOB: 1960/04/19, 55 y.o.   MRN: [id number]  HPI: Brianna [name] is a 55 y.o. female presenting on 01/09/2015 for Establish Care   HPI Cervical and Lumbar disc problems with radiculopathy Patient is coming in as a new patient and has had chronic lumbar disc problems with radicular symptoms going down her right leg. She takes Neurontin for this and occasional Motrin and would like a referral to orthopedic surgery to be assessed. She has cervical disc problems with radiculopathy down her left arm described as pins and needles but no numbness or weakness in either arm or leg.  Anxiety and depression Patient also comes in today to establish care with [place] for her anxiety and depression. She has been on Celexa at 40 mg for at least 6 or 7 months and feels like it is just not doing as well for her and would like to try something differently. She feels like she is having crying episodes and difficulty sleeping and anxiety attacks occasionally. She denies any suicidal ideation or thoughts of hurting herself. She denies any episodes of significant high mood lasting for days without need for sleep.  Cough and nasal congestion Patient is also had cough and nasal congestion for the past 3 or 4 days and has tried some over-the-counter Alka-Seltzer sinus and cold without much success. She is also been taking Motrin for her muscular pain but to help along with this as well. She denies any fevers or chills. She has a cough but it is nonproductive and infrequent. She is mostly concerned about the nasal congestion and the postnasal drainage and preventing it from going down into her chest. She denies any shortness of breath or wheezing  Relevant past medical, surgical, family and social history reviewed  and updated as indicated. Interim medical history since our last visit reviewed. Allergies and medications reviewed and updated.  Review of Systems  Constitutional: Negative for fever and chills.  HENT: Positive for congestion, postnasal drip, rhinorrhea, sinus pressure and sore throat. Negative for ear discharge, ear pain and sneezing.   Eyes: Negative for pain, redness and visual disturbance.  Respiratory: Positive for cough. Negative for chest tightness and shortness of breath.   Cardiovascular: Negative for chest pain and leg swelling.  Genitourinary: Negative for dysuria and difficulty urinating.  Musculoskeletal: Positive for myalgias and back pain. Negative for gait problem.  Skin: Negative for rash.  Neurological: Negative for weakness, light-headedness, numbness and headaches.  Psychiatric/Behavioral: Positive for sleep disturbance, dysphoric mood and decreased concentration. Negative for suicidal ideas, behavioral problems, self-injury and agitation. The patient is nervous/anxious. The patient is not hyperactive.   All other systems reviewed and are negative.   Per HPI unless specifically indicated above     Medication List       This list is accurate as of: 01/09/15  9:16 AM.  Always use your most recent med list.               dexlansoprazole 60 MG capsule  Commonly known as:  DEXILANT  Take 1 capsule (60 mg total) by mouth daily.     fluticasone 50 MCG/ACT nasal spray  Commonly known as:  FLONASE  Place 1 spray into both nostrils  2 (two) times daily as needed for allergies or rhinitis.     gabapentin 300 MG capsule  Commonly known as:  NEURONTIN  Take 1 capsule (300 mg total) by mouth 3 (three) times daily.     hydrochlorothiazide 25 MG tablet  Commonly known as:  HYDRODIURIL  Take 1 tablet (25 mg total) by mouth daily.     ibuprofen 600 MG tablet  Commonly known as:  ADVIL,MOTRIN  Take 1 tablet (600 mg total) by mouth 3 (three) times daily.     loratadine  10 MG tablet  Commonly known as:  CLARITIN  Take 1 tablet (10 mg total) by mouth daily.     lubiprostone 24 MCG capsule  Commonly known as:  AMITIZA  Take 1 capsule (24 mcg total) by mouth 2 (two) times daily as needed for constipation.     traMADol 50 MG tablet  Commonly known as:  ULTRAM  Take 1 tablet (50 mg total) by mouth every 8 (eight) hours as needed.     venlafaxine XR 37.5 MG 24 hr capsule  Commonly known as:  EFFEXOR XR  Take 1 capsule (37.5 mg total) by mouth daily with breakfast.           Objective:    BP 111/81 mmHg  Pulse 83  Temp(Src) 98.5 F (36.9 C) (Oral)  Ht 5' 3.5" (1.613 m)  Wt 151 lb 12.8 oz (68.856 kg)  BMI 26.47 kg/m2  LMP 04/14/2012  Wt Readings from Last 3 Encounters:  01/09/15 151 lb 12.8 oz (68.856 kg)  12/04/14 152 lb (68.947 kg)  10/23/14 150 lb (68.04 kg)    Physical Exam  Constitutional: She is oriented to person, place, and time. She appears well-developed and well-nourished. No distress.  HENT:  Right Ear: Tympanic membrane, external ear and ear canal normal.  Left Ear: Tympanic membrane, external ear and ear canal normal.  Nose: Mucosal edema and rhinorrhea present. No epistaxis. Right sinus exhibits no maxillary sinus tenderness and no frontal sinus tenderness. Left sinus exhibits no maxillary sinus tenderness and no frontal sinus tenderness.  Mouth/Throat: Uvula is midline and mucous membranes are normal. Posterior oropharyngeal edema and posterior oropharyngeal erythema present. No oropharyngeal exudate or tonsillar abscesses.  Eyes: Conjunctivae and EOM are normal.  Neck: Neck supple. No thyromegaly present.  Cardiovascular: Normal rate, regular rhythm, normal heart sounds and intact distal pulses.   No murmur heard. Pulmonary/Chest: Effort normal and breath sounds normal. No respiratory distress. She has no wheezes.  Musculoskeletal: Normal range of motion. She exhibits no edema.       Cervical back: She exhibits tenderness  (muscular tenderness on the left side more than right but bilateral, no bony tenderness). She exhibits normal range of motion, no swelling and no deformity.       Lumbar back: She exhibits tenderness (Bilateral paraspinal tenderness but worse on right) and spasm. She exhibits normal range of motion, no bony tenderness, no deformity and no laceration.  Lymphadenopathy:    She has no cervical adenopathy.  Neurological: She is alert and oriented to person, place, and time. Coordination normal.  Skin: Skin is warm and dry. No rash noted. She is not diaphoretic.  Psychiatric: She has a normal mood and affect. Her behavior is normal.  Vitals reviewed.      Assessment & Plan:   Problem List Items Addressed This Visit      Nervous and Auditory   Lumbar disc disease with radiculopathy - Primary   Relevant Medications  venlafaxine XR (EFFEXOR XR) 37.5 MG 24 hr capsule   gabapentin (NEURONTIN) 300 MG capsule   traMADol (ULTRAM) 50 MG tablet   ibuprofen (ADVIL,MOTRIN) 600 MG tablet   Other Relevant Orders   Ambulatory referral to Orthopedic Surgery     Other   Anxiety and depression   Relevant Medications   venlafaxine XR (EFFEXOR XR) 37.5 MG 24 hr capsule    Other Visit Diagnoses    Cervical disc disorder with radiculopathy of cervical region        Relevant Medications    gabapentin (NEURONTIN) 300 MG capsule    Upper respiratory infection        Relevant Medications    fluticasone (FLONASE) 50 MCG/ACT nasal spray    loratadine (CLARITIN) 10 MG tablet        Follow up plan: Return in about 4 weeks (around 02/06/2015), or if symptoms worsen or fail to improve, for anxiety and depression.  Counseling provided for all of the vaccine components Orders Placed This Encounter  Procedures  . Ambulatory referral to [place] Det[place], MD [facility] 01/09/2015, 9:16 AM

## 2015-01-15 ENCOUNTER — Telehealth: Payer: Self-pay

## 2015-01-15 NOTE — Telephone Encounter (Signed)
Medicaid non preferred Dexilant  Preferred are Nexium RX OTC capsule, omeprazole rx capsule, pantoprazole tablet or Protonix suspension

## 2015-01-15 NOTE — Telephone Encounter (Signed)
We will have to discuss this with the patient first because I believe she has been on Dexilant for some time. If she wants to try to go through prior authorization we can.

## 2015-01-16 ENCOUNTER — Ambulatory Visit: Payer: Self-pay | Admitting: Family Medicine

## 2015-01-16 NOTE — Telephone Encounter (Signed)
Patient getting Dexilant free through her company at present.  She has another refill also.  She will let us know later whether she wishes to change to another medication.

## 2015-02-06 ENCOUNTER — Ambulatory Visit: Payer: Medicaid Other | Admitting: Family Medicine

## 2015-02-07 ENCOUNTER — Ambulatory Visit: Payer: Medicaid Other | Admitting: Family Medicine

## 2015-02-08 ENCOUNTER — Encounter: Payer: Self-pay | Admitting: Family Medicine

## 2015-02-11 ENCOUNTER — Encounter: Payer: Self-pay | Admitting: Family Medicine

## 2015-02-11 ENCOUNTER — Ambulatory Visit (INDEPENDENT_AMBULATORY_CARE_PROVIDER_SITE_OTHER): Payer: Medicaid Other | Admitting: Family Medicine

## 2015-02-11 VITALS — BP 90/59 | HR 82 | Temp 98.5°F | Ht 63.5 in | Wt 154.6 lb

## 2015-02-11 DIAGNOSIS — F329 Major depressive disorder, single episode, unspecified: Secondary | ICD-10-CM

## 2015-02-11 DIAGNOSIS — F418 Other specified anxiety disorders: Secondary | ICD-10-CM | POA: Diagnosis not present

## 2015-02-11 DIAGNOSIS — F419 Anxiety disorder, unspecified: Principal | ICD-10-CM

## 2015-02-11 DIAGNOSIS — F32A Depression, unspecified: Secondary | ICD-10-CM

## 2015-02-11 MED ORDER — TRAZODONE HCL 50 MG PO TABS: 25.0000 mg | ORAL_TABLET | Freq: Every evening | ORAL | Status: DC | PRN

## 2015-02-11 MED ORDER — CITALOPRAM HYDROBROMIDE 40 MG PO TABS: 40.0000 mg | ORAL_TABLET | Freq: Every day | ORAL | Status: DC

## 2015-02-11 NOTE — Progress Notes (Signed)
BP 90/59 mmHg  Pulse 82  Temp(Src) 98.5 F (36.9 C) (Oral)  Ht 5' 3.5" (1.613 m)  Wt 154 lb 9.6 oz (70.126 kg)  BMI 26.95 kg/m2  LMP 04/14/2012   Subjective:    Patient ID: Brianna [name], female    DOB: 09-28-1960, 55 y.o.   MRN: [id number]  HPI: Brianna [name] is a 55 y.o. female presenting on 02/11/2015 for Follow-up   HPI Anxiety depression follow-up Patient comes in today for recheck on her anxiety and depression. She was started on Effexor last time and so the citalopram and did it for one week but developed headaches and mood swings that she attributed to the medication and did not like it and switched back to her citalopram 40 mg. She's been taking that for the past 3 weeks but feels like it is not fully helping her and would like something to add on or help her with her panic attacks and anxiety that she has. She is also not sleeping well. She denies any thoughts of suicide or thoughts of hurting herself. She is not currently seeing a counselor  Relevant past medical, surgical, family and social history reviewed and updated as indicated. Interim medical history since our last visit reviewed. Allergies and medications reviewed and updated.  Review of Systems  Constitutional: Negative for fever and chills.  HENT: Negative for congestion, ear discharge and ear pain.   Eyes: Negative for redness and visual disturbance.  Respiratory: Negative for chest tightness and shortness of breath.   Cardiovascular: Negative for chest pain and leg swelling.  Genitourinary: Negative for dysuria and difficulty urinating.  Musculoskeletal: Negative for back pain and gait problem.  Skin: Negative for rash.  Neurological: Negative for light-headedness and headaches.  Psychiatric/Behavioral: Positive for sleep disturbance, dysphoric mood and decreased concentration. Negative for suicidal ideas, behavioral problems, self-injury and agitation. The patient is nervous/anxious.   All other  systems reviewed and are negative.   Per HPI unless specifically indicated above     Medication List       This list is accurate as of: 02/11/15 10:35 AM.  Always use your most recent med list.               citalopram 40 MG tablet  Commonly known as:  CELEXA  Take 1 tablet (40 mg total) by mouth daily.     dexlansoprazole 60 MG capsule  Commonly known as:  DEXILANT  Take 1 capsule (60 mg total) by mouth daily.     fluticasone 50 MCG/ACT nasal spray  Commonly known as:  FLONASE  Place 1 spray into both nostrils 2 (two) times daily as needed for allergies or rhinitis.     gabapentin 300 MG capsule  Commonly known as:  NEURONTIN  Take 1 capsule (300 mg total) by mouth 3 (three) times daily.     hydrochlorothiazide 25 MG tablet  Commonly known as:  HYDRODIURIL  Take 1 tablet (25 mg total) by mouth daily.     ibuprofen 600 MG tablet  Commonly known as:  ADVIL,MOTRIN  Take 1 tablet (600 mg total) by mouth 3 (three) times daily.     loratadine 10 MG tablet  Commonly known as:  CLARITIN  Take 1 tablet (10 mg total) by mouth daily.     lubiprostone 24 MCG capsule  Commonly known as:  AMITIZA  Take 1 capsule (24 mcg total) by mouth 2 (two) times daily as needed for constipation.     traMADol 50  MG tablet  Commonly known as:  ULTRAM  Take 1 tablet (50 mg total) by mouth every 8 (eight) hours as needed.     traZODone 50 MG tablet  Commonly known as:  DESYREL  Take 0.5-1 tablets (25-50 mg total) by mouth at bedtime as needed for sleep.           Objective:    BP 90/59 mmHg  Pulse 82  Temp(Src) 98.5 F (36.9 C) (Oral)  Ht 5' 3.5" (1.613 m)  Wt 154 lb 9.6 oz (70.126 kg)  BMI 26.95 kg/m2  LMP 04/14/2012  Wt Readings from Last 3 Encounters:  02/11/15 154 lb 9.6 oz (70.126 kg)  01/09/15 151 lb 12.8 oz (68.856 kg)  12/04/14 152 lb (68.947 kg)    Physical Exam  Constitutional: She is oriented to person, place, and time. She appears well-developed and  well-nourished. No distress.  Eyes: Conjunctivae and EOM are normal. Pupils are equal, round, and reactive to light.  Cardiovascular: Normal rate, regular rhythm, normal heart sounds and intact distal pulses.   No murmur heard. Pulmonary/Chest: Effort normal and breath sounds normal. No respiratory distress. She has no wheezes.  Musculoskeletal: Normal range of motion. She exhibits no edema or tenderness.  Neurological: She is alert and oriented to person, place, and time. Coordination normal.  Skin: Skin is warm and dry. No rash noted. She is not diaphoretic.  Psychiatric: Her behavior is normal. Judgment and thought content normal. Her mood appears anxious. Her affect is labile. She exhibits a depressed mood. She expresses no suicidal ideation. She expresses no suicidal plans.  Nursing note and vitals reviewed.     Assessment & Plan:   Problem List Items Addressed This Visit      Other   Anxiety and depression - Primary    Keep on the Celexa for now and add trazodone at bedtime as an adjunct and see how it does.      Relevant Medications   traZODone (DESYREL) 50 MG tablet   citalopram (CELEXA) 40 MG tablet       Follow up plan: Return in about 4 weeks (around 03/11/2015), or if symptoms worsen or fail to improve, for [facility] woman exam and follow-up depression.  Counseling provided for all of the vaccine components No orders of the defined types were placed in this encounter.    Caryl Pina, MD [facility] 02/11/2015, 10:35 AM

## 2015-02-11 NOTE — Assessment & Plan Note (Signed)
Keep on the Celexa for now and add trazodone at bedtime as an adjunct and see how it does.

## 2015-02-18 ENCOUNTER — Telehealth: Payer: Self-pay | Admitting: Family Medicine

## 2015-02-18 NOTE — Telephone Encounter (Signed)
Patient returned call, she wanted to make an appointment to see Dr. Warrick Parisian, appointment made for 02/21/15, offered appointment for 02/20/15 and patient said she could not come that day

## 2015-02-18 NOTE — Telephone Encounter (Signed)
Come in for blood pressure checks or check blood pressure at home if you have a machine to see how it is doing off the medication. Only use trazodone to help fall asleep. If it is not helping fall asleep then don't use it. If she wants to discuss other things for sleep and she could come in for an appointment.

## 2015-02-21 ENCOUNTER — Ambulatory Visit (INDEPENDENT_AMBULATORY_CARE_PROVIDER_SITE_OTHER): Payer: Medicaid Other | Admitting: Family Medicine

## 2015-02-21 ENCOUNTER — Telehealth: Payer: Self-pay | Admitting: Family Medicine

## 2015-02-21 ENCOUNTER — Encounter: Payer: Self-pay | Admitting: Family Medicine

## 2015-02-21 VITALS — BP 118/71 | HR 70 | Temp 98.6°F | Ht 63.5 in | Wt 157.8 lb

## 2015-02-21 DIAGNOSIS — J309 Allergic rhinitis, unspecified: Secondary | ICD-10-CM

## 2015-02-21 DIAGNOSIS — F418 Other specified anxiety disorders: Secondary | ICD-10-CM | POA: Diagnosis not present

## 2015-02-21 DIAGNOSIS — F341 Dysthymic disorder: Secondary | ICD-10-CM | POA: Diagnosis not present

## 2015-02-21 DIAGNOSIS — F329 Major depressive disorder, single episode, unspecified: Secondary | ICD-10-CM

## 2015-02-21 DIAGNOSIS — I10 Essential (primary) hypertension: Secondary | ICD-10-CM | POA: Insufficient documentation

## 2015-02-21 DIAGNOSIS — H1013 Acute atopic conjunctivitis, bilateral: Secondary | ICD-10-CM | POA: Diagnosis not present

## 2015-02-21 DIAGNOSIS — F419 Anxiety disorder, unspecified: Secondary | ICD-10-CM

## 2015-02-21 DIAGNOSIS — F5105 Insomnia due to other mental disorder: Secondary | ICD-10-CM

## 2015-02-21 DIAGNOSIS — F32A Depression, unspecified: Secondary | ICD-10-CM

## 2015-02-21 MED ORDER — OLOPATADINE HCL 0.7 % OP SOLN: 1.0000 [drp] | Freq: Every day | OPHTHALMIC | Status: DC | PRN

## 2015-02-21 MED ORDER — TRAZODONE HCL 50 MG PO TABS: 50.0000 mg | ORAL_TABLET | Freq: Every evening | ORAL | Status: DC | PRN

## 2015-02-21 MED ORDER — HYDROCHLOROTHIAZIDE 12.5 MG PO TABS
12.5000 mg | ORAL_TABLET | Freq: Every day | ORAL | Status: DC
Start: 2015-02-21 — End: 2016-03-18

## 2015-02-21 MED ORDER — OLOPATADINE HCL 0.1 % OP SOLN: 1.0000 [drp] | Freq: Two times a day (BID) | OPHTHALMIC | Status: DC | PRN

## 2015-02-21 NOTE — Telephone Encounter (Signed)
Insurance does not want to pay for patanol but they will pay for P[place] can you please change

## 2015-02-21 NOTE — Progress Notes (Signed)
BP 118/71 mmHg  Pulse 70  Temp(Src) 98.6 F (37 C) (Oral)  Ht 5' 3.5" (1.613 m)  Wt 157 lb 12.8 oz (71.578 kg)  BMI 27.51 kg/m2  LMP 04/14/2012   Subjective:    Patient ID: Brianna [name], female    DOB: 16-Jul-1960, 55 y.o.   MRN: [id number]  HPI: Brianna [name] is a 55 y.o. female presenting on 02/21/2015 for Hypotension; Insomnia; and Watery eyes   HPI Hypertension Patient is coming today for hypertension. Her blood pressure today is 118/71. She says at home she is getting some episodes down in the 90s over 3s. She also has been having the occasional lightheadedness but no syncope. She denies any chest pains or vision changes. She stopped taking the water pill for a couple days and then felt like she was swelling so she started back on it. She is wondering if anything can be changed for that.  Insomnia secondary to anxiety and depression Patient feels like her anxiety has felt a lot more and is not being controlled and that is keeping her up at night. She has been using trazodone 50 mg and has done that once or twice and has not seen a great effect from it yet. Just needs some help sleeping. She would like to either try something else or change the dose on the trazodone.  Watery and itchy eyes Patient has been having watery and itchy eyes over the past couple weeks. She has been using Claritin and Flonase which usually help but have not been helping as much more recently. They are controlling her nasal symptoms and cough but are not controlling the itchy watery eyes. She feels like it is her seasonal allergies acting up again.  Relevant past medical, surgical, family and social history reviewed and updated as indicated. Interim medical history since our last visit reviewed. Allergies and medications reviewed and updated.  Review of Systems  Constitutional: Negative for fever and chills.  HENT: Negative for congestion, ear discharge and ear pain.   Eyes: Positive for  itching. Negative for redness and visual disturbance.  Respiratory: Negative for chest tightness and shortness of breath.   Cardiovascular: Negative for chest pain, palpitations and leg swelling.  Gastrointestinal: Negative for abdominal pain.  Genitourinary: Negative for dysuria and difficulty urinating.  Musculoskeletal: Negative for back pain and gait problem.  Skin: Negative for rash.  Neurological: Negative for dizziness, light-headedness and headaches.  Psychiatric/Behavioral: Negative for behavioral problems and agitation.  All other systems reviewed and are negative.  Per HPI unless specifically indicated above    Medication List       This list is accurate as of: 02/21/15  8:52 AM.  Always use your most recent med list.               citalopram 40 MG tablet  Commonly known as:  CELEXA  Take 1 tablet (40 mg total) by mouth daily.     dexlansoprazole 60 MG capsule  Commonly known as:  DEXILANT  Take 1 capsule (60 mg total) by mouth daily.     fluticasone 50 MCG/ACT nasal spray  Commonly known as:  FLONASE  Place 1 spray into both nostrils 2 (two) times daily as needed for allergies or rhinitis.     gabapentin 300 MG capsule  Commonly known as:  NEURONTIN  Take 1 capsule (300 mg total) by mouth 3 (three) times daily.     hydrochlorothiazide 12.5 MG tablet  Commonly known as:  HYDRODIURIL  Take 1 tablet (12.5 mg total) by mouth daily.     ibuprofen 600 MG tablet  Commonly known as:  ADVIL,MOTRIN  Take 1 tablet (600 mg total) by mouth 3 (three) times daily.     loratadine 10 MG tablet  Commonly known as:  CLARITIN  Take 1 tablet (10 mg total) by mouth daily.     lubiprostone 24 MCG capsule  Commonly known as:  AMITIZA  Take 1 capsule (24 mcg total) by mouth 2 (two) times daily as needed for constipation.     olopatadine 0.1 % ophthalmic solution  Commonly known as:  PATANOL  Place 1 drop into both eyes 2 (two) times daily as needed for allergies.      traMADol 50 MG tablet  Commonly known as:  ULTRAM  Take 1 tablet (50 mg total) by mouth every 8 (eight) hours as needed.     traZODone 50 MG tablet  Commonly known as:  DESYREL  Take 1-2 tablets (50-100 mg total) by mouth at bedtime as needed for sleep.          Objective:    BP 118/71 mmHg  Pulse 70  Temp(Src) 98.6 F (37 C) (Oral)  Ht 5' 3.5" (1.613 m)  Wt 157 lb 12.8 oz (71.578 kg)  BMI 27.51 kg/m2  LMP 04/14/2012  Wt Readings from Last 3 Encounters:  02/21/15 157 lb 12.8 oz (71.578 kg)  02/11/15 154 lb 9.6 oz (70.126 kg)  01/09/15 151 lb 12.8 oz (68.856 kg)    Physical Exam  Constitutional: She is oriented to person, place, and time. She appears well-developed and well-nourished. No distress.  HENT:  Right Ear: External ear normal.  Left Ear: External ear normal.  Nose: Nose normal.  Mouth/Throat: Oropharynx is clear and moist. No oropharyngeal exudate.  Eyes: Conjunctivae and EOM are normal. Pupils are equal, round, and reactive to light. Right eye exhibits no discharge and no exudate. No foreign body present in the right eye.  Neck: Neck supple. No thyromegaly present.  Cardiovascular: Normal rate, regular rhythm, normal heart sounds and intact distal pulses.   No murmur heard. Pulmonary/Chest: Effort normal and breath sounds normal. No respiratory distress. She has no wheezes.  Musculoskeletal: Normal range of motion. She exhibits no edema or tenderness.  Lymphadenopathy:    She has no cervical adenopathy.  Neurological: She is alert and oriented to person, place, and time. Coordination normal.  Skin: Skin is warm and dry. No rash noted. She is not diaphoretic.  Psychiatric: She has a normal mood and affect. Her behavior is normal.  Nursing note and vitals reviewed.      Assessment & Plan:   Problem List Items Addressed This Visit      Cardiovascular and Mediastinum   Essential hypertension, benign - Primary   Relevant Medications   hydrochlorothiazide  (HYDRODIURIL) 12.5 MG tablet     Other   Anxiety and depression   Relevant Medications   traZODone (DESYREL) 50 MG tablet   Insomnia secondary to depression with anxiety   Relevant Medications   traZODone (DESYREL) 50 MG tablet    Other Visit Diagnoses    Allergic conjunctivitis and rhinitis, bilateral           Follow up plan: Return in about 3 weeks (around 03/14/2015), or if symptoms worsen or fail to improve, for Follow-up anxiety and depression and insomnia.  Counseling provided for all of the vaccine components No orders of the defined types were placed in this encounter.  Caryl Pina, MD [facility] 02/21/2015, 8:52 AM

## 2015-03-04 ENCOUNTER — Encounter: Payer: Self-pay | Admitting: Gastroenterology

## 2015-03-04 ENCOUNTER — Telehealth: Payer: Self-pay

## 2015-03-04 NOTE — Telephone Encounter (Signed)
Pt called- she was getting dexilant thru pt assistance, she now has [place] medicaid and and needs a PA for dexilant. She has only tried and failed pantoprazole. Per [place] medicaid, pt must try and fail both omeprazole and pantoprazole prior to getting dexilant. Pt is aware of this and stated she would try the omeprazole and let me know how it works for her and we will do PA for dexilant if she fails omeprazole.   Please send in rx for omeprazole to [facility]

## 2015-03-04 NOTE — Telephone Encounter (Signed)
She is on citalopram. I would rather not give her Prilosec since she is taking this due to the risk of increased citalopram levels, risk of EKG changes, arrhythmias, etc.   I have a written a letter for Medicaid.

## 2015-03-05 NOTE — Telephone Encounter (Signed)
Did PA with [place], listed possible interaction with citalopram. PA was approved for dexilant. Approval has been faxed to [facility]. I called pt and LMOM with this information.

## 2015-03-12 ENCOUNTER — Encounter: Payer: Medicaid Other | Admitting: Family Medicine

## 2015-03-15 ENCOUNTER — Encounter: Payer: Self-pay | Admitting: Family Medicine

## 2015-03-15 ENCOUNTER — Ambulatory Visit (INDEPENDENT_AMBULATORY_CARE_PROVIDER_SITE_OTHER): Payer: Medicaid Other | Admitting: Family Medicine

## 2015-03-15 VITALS — BP 111/80 | HR 88 | Temp 98.4°F | Ht 63.5 in | Wt 158.2 lb

## 2015-03-15 DIAGNOSIS — I10 Essential (primary) hypertension: Secondary | ICD-10-CM

## 2015-03-15 DIAGNOSIS — J309 Allergic rhinitis, unspecified: Secondary | ICD-10-CM

## 2015-03-15 DIAGNOSIS — Z0000 Encounter for general adult medical examination without abnormal findings: Secondary | ICD-10-CM

## 2015-03-15 DIAGNOSIS — Z114 Encounter for screening for human immunodeficiency virus [HIV]: Secondary | ICD-10-CM

## 2015-03-15 DIAGNOSIS — Z01419 Encounter for gynecological examination (general) (routine) without abnormal findings: Secondary | ICD-10-CM

## 2015-03-15 NOTE — Progress Notes (Signed)
BP 111/80 mmHg  Pulse 88  Temp(Src) 98.4 F (36.9 C) (Oral)  Ht 5' 3.5" (1.613 m)  Wt 158 lb 3.2 oz (71.759 kg)  BMI 27.58 kg/m2  LMP 04/14/2012   Subjective:    Patient ID: Brianna [name], female    DOB: 1960-01-14, 55 y.o.   MRN: [id number]  HPI: Brianna [name] is a 55 y.o. female presenting on 03/15/2015 for Gynecologic Exam and Insomnia   HPI Well woman exam with Pap Patient is coming in today for a well woman exam with Pap smear. It is been 3 or 4 years since she's had one last. She does not recall having any abnormals in the past. She denies any issues with her breasts such as drainage or color changes or lumps. She denies any chest pain, shortness of breath, headaches or vision issues, abdominal complaints, diarrhea, nausea, vomiting, or joint issues.   Chronic allergic rhinitis Patient is still having issues with her allergies in her sinuses and her eyes despite being on Claritin and olopatadine drops and Flonase. She denies any fevers or chills or cough or wheeze, it is mainly just the watering in her eyes and runny nose that are bothering her.  Hypertension recheck Patient is currently on hydrochlorothiazide 12.5 mg and her blood pressure today is 111/80. Patient denies headaches, blurred vision, chest pains, shortness of breath, or weakness. Denies any side effects from medication and is content with current medication.   Relevant past medical, surgical, family and social history reviewed and updated as indicated. Interim medical history since our last visit reviewed. Allergies and medications reviewed and updated.  Review of Systems  Constitutional: Negative for fever and chills.  HENT: Positive for congestion, postnasal drip, rhinorrhea, sinus pressure, sneezing and sore throat. Negative for ear discharge and ear pain.   Eyes: Negative for pain, redness and visual disturbance.  Respiratory: Positive for cough. Negative for chest tightness and shortness of  breath.   Cardiovascular: Negative for chest pain and leg swelling.  Genitourinary: Negative for dysuria and difficulty urinating.  Musculoskeletal: Negative for back pain and gait problem.  Skin: Negative for rash.  Neurological: Negative for light-headedness and headaches.  Psychiatric/Behavioral: Negative for behavioral problems and agitation.  All other systems reviewed and are negative.   Per HPI unless specifically indicated above     Medication List       This list is accurate as of: 03/15/15 12:01 PM.  Always use your most recent med list.               citalopram 40 MG tablet  Commonly known as:  CELEXA  Take 1 tablet (40 mg total) by mouth daily.     dexlansoprazole 60 MG capsule  Commonly known as:  DEXILANT  Take 1 capsule (60 mg total) by mouth daily.     fluticasone 50 MCG/ACT nasal spray  Commonly known as:  FLONASE  Place 1 spray into both nostrils 2 (two) times daily as needed for allergies or rhinitis.     gabapentin 300 MG capsule  Commonly known as:  NEURONTIN  Take 1 capsule (300 mg total) by mouth 3 (three) times daily.     hydrochlorothiazide 12.5 MG tablet  Commonly known as:  HYDRODIURIL  Take 1 tablet (12.5 mg total) by mouth daily.     ibuprofen 600 MG tablet  Commonly known as:  ADVIL,MOTRIN  Take 1 tablet (600 mg total) by mouth 3 (three) times daily.     loratadine 10  MG tablet  Commonly known as:  CLARITIN  Take 1 tablet (10 mg total) by mouth daily.     lubiprostone 24 MCG capsule  Commonly known as:  AMITIZA  Take 1 capsule (24 mcg total) by mouth 2 (two) times daily as needed for constipation.     Olopatadine HCl 0.7 % Soln  Commonly known as:  PAZEO  Apply 1 drop to eye daily as needed.     traMADol 50 MG tablet  Commonly known as:  ULTRAM  Take 1 tablet (50 mg total) by mouth every 8 (eight) hours as needed.     traZODone 50 MG tablet  Commonly known as:  DESYREL  Take 1-2 tablets (50-100 mg total) by mouth at bedtime  as needed for sleep.           Objective:    BP 111/80 mmHg  Pulse 88  Temp(Src) 98.4 F (36.9 C) (Oral)  Ht 5' 3.5" (1.613 m)  Wt 158 lb 3.2 oz (71.759 kg)  BMI 27.58 kg/m2  LMP 04/14/2012  Wt Readings from Last 3 Encounters:  03/15/15 158 lb 3.2 oz (71.759 kg)  02/21/15 157 lb 12.8 oz (71.578 kg)  02/11/15 154 lb 9.6 oz (70.126 kg)    Physical Exam  Constitutional: She is oriented to person, place, and time. She appears well-developed and well-nourished. No distress.  HENT:  Right Ear: Tympanic membrane, external ear and ear canal normal.  Left Ear: Tympanic membrane, external ear and ear canal normal.  Nose: Mucosal edema and rhinorrhea present. No epistaxis. Right sinus exhibits no maxillary sinus tenderness and no frontal sinus tenderness. Left sinus exhibits no maxillary sinus tenderness and no frontal sinus tenderness.  Mouth/Throat: Uvula is midline and mucous membranes are normal. Posterior oropharyngeal edema and posterior oropharyngeal erythema present. No oropharyngeal exudate or tonsillar abscesses.  Eyes: Conjunctivae and EOM are normal. Pupils are equal, round, and reactive to light.  Neck: Neck supple. No thyromegaly present.  Cardiovascular: Normal rate, regular rhythm, normal heart sounds and intact distal pulses.   No murmur heard. Pulmonary/Chest: Effort normal and breath sounds normal. No respiratory distress. She has no wheezes. Right breast exhibits no inverted nipple, no mass, no nipple discharge, no skin change and no tenderness. Left breast exhibits no inverted nipple, no mass, no nipple discharge, no skin change and no tenderness. Breasts are symmetrical.  Abdominal: Soft. Bowel sounds are normal. She exhibits no distension. There is no tenderness. There is no rebound and no guarding.  Genitourinary: Vagina normal and uterus normal. No breast swelling, tenderness, discharge or bleeding. Pelvic exam was performed with patient supine. There is no rash or  lesion on the right labia. There is no rash or lesion on the left labia. Uterus is not deviated, not enlarged, not fixed and not tender. Cervix exhibits no motion tenderness, no discharge and no friability. Right adnexum displays no mass and no tenderness. Left adnexum displays no mass and no tenderness.  Musculoskeletal: Normal range of motion. She exhibits no edema or tenderness.  Lymphadenopathy:    She has no cervical adenopathy.    She has no axillary adenopathy.  Neurological: She is alert and oriented to person, place, and time. Coordination normal.  Skin: Skin is warm and dry. No rash noted. She is not diaphoretic.  Psychiatric: She has a normal mood and affect. Her behavior is normal.  Vitals reviewed.   Results for orders placed or performed during the hospital encounter of 10/20/14  Lipase, blood  Result Value  Ref Range   Lipase 19 (L) 22 - 51 U/L  Comprehensive metabolic panel  Result Value Ref Range   Sodium 137 135 - 145 mmol/L   Potassium 3.6 3.5 - 5.1 mmol/L   Chloride 104 101 - 111 mmol/L   CO2 30 22 - 32 mmol/L   Glucose, Bld 93 65 - 99 mg/dL   BUN 16 6 - 20 mg/dL   Creatinine, Ser 0.90 0.44 - 1.00 mg/dL   Calcium 9.3 8.9 - 10.3 mg/dL   Total Protein 7.2 6.5 - 8.1 g/dL   Albumin 4.0 3.5 - 5.0 g/dL   AST 40 15 - 41 U/L   ALT 22 14 - 54 U/L   Alkaline Phosphatase 81 38 - 126 U/L   Total Bilirubin 0.6 0.3 - 1.2 mg/dL   GFR calc non Af Amer >60 >60 mL/min   GFR calc Af Amer >60 >60 mL/min   Anion gap 3 (L) 5 - 15  CBC  Result Value Ref Range   WBC 6.4 4.0 - 10.5 K/uL   RBC 4.22 3.87 - 5.11 MIL/uL   Hemoglobin 12.3 12.0 - 15.0 g/dL   HCT 36.5 36.0 - 46.0 %   MCV 86.5 78.0 - 100.0 fL   MCH 29.1 26.0 - 34.0 pg   MCHC 33.7 30.0 - 36.0 g/dL   RDW 12.4 11.5 - 15.5 %   Platelets 296 150 - 400 K/uL  Urinalysis, Routine w reflex microscopic (not at [facility])  Result Value Ref Range   Color, Urine YELLOW YELLOW   APPearance HAZY (A) CLEAR   Specific Gravity, Urine  1.015 1.005 - 1.030   pH 7.5 5.0 - 8.0   Glucose, UA NEGATIVE NEGATIVE mg/dL   Hgb urine dipstick NEGATIVE NEGATIVE   Bilirubin Urine NEGATIVE NEGATIVE   Ketones, ur NEGATIVE NEGATIVE mg/dL   Protein, ur NEGATIVE NEGATIVE mg/dL   Urobilinogen, UA 0.2 0.0 - 1.0 mg/dL   Nitrite NEGATIVE NEGATIVE   Leukocytes, UA NEGATIVE NEGATIVE      Assessment & Plan:   Problem List Items Addressed This Visit      Cardiovascular and Mediastinum   Essential hypertension, benign   Relevant Orders   CMP14+EGFR   Lipid panel    Other Visit Diagnoses    Well woman exam with routine gynecological exam    -  Primary    Relevant Orders    Pap IG, rfx HPV all pth    Chronic allergic rhinitis        Relevant Orders    Ambulatory referral to Allergy    Screening for HIV without presence of risk factors        Relevant Orders    HIV antibody        Follow up plan: Return in about 6 months (around 09/15/2015), or if symptoms worsen or fail to improve, for Recheck insomnia.  Counseling provided for all of the vaccine components Orders Placed This Encounter  Procedures  . CMP14+EGFR  . Lipid panel  . HIV antibody  . Ambulatory referral to [place], MD [facility] 03/15/2015, 12:01 PM

## 2015-03-17 LAB — HIV ANTIBODY (ROUTINE TESTING W REFLEX): HIV Screen 4th Generation wRfx: NONREACTIVE

## 2015-03-17 LAB — CMP14+EGFR
A/G RATIO: 1.8 (ref 1.1–2.5)
ALK PHOS: 99 IU/L (ref 39–117)
ALT: 19 IU/L (ref 0–32)
AST: 23 IU/L (ref 0–40)
Albumin: 4.2 g/dL (ref 3.5–5.5)
BUN/Creatinine Ratio: 19 (ref 9–23)
BUN: 18 mg/dL (ref 6–24)
CHLORIDE: 95 mmol/L — AB (ref 96–106)
CO2: 25 mmol/L (ref 18–29)
Calcium: 9.8 mg/dL (ref 8.7–10.2)
Creatinine, Ser: 0.93 mg/dL (ref 0.57–1.00)
GFR calc non Af Amer: 70 mL/min/{1.73_m2} (ref >59)
GFR, EST AFRICAN AMERICAN: 81 mL/min/{1.73_m2} (ref >59)
Globulin, Total: 2.4 g/dL (ref 1.5–4.5)
Glucose: 83 mg/dL (ref 65–99)
POTASSIUM: 4.2 mmol/L (ref 3.5–5.2)
Sodium: 139 mmol/L (ref 134–144)
Total Protein: 6.6 g/dL (ref 6.0–8.5)

## 2015-03-17 LAB — LIPID PANEL
CHOL/HDL RATIO: 2.8 ratio (ref 0.0–4.4)
CHOLESTEROL TOTAL: 209 mg/dL — AB (ref 100–199)
HDL: 75 mg/dL (ref >39)
LDL CALC: 109 mg/dL — AB (ref 0–99)
TRIGLYCERIDES: 124 mg/dL (ref 0–149)
VLDL CHOLESTEROL CAL: 25 mg/dL (ref 5–40)

## 2015-03-22 LAB — PAP IG, RFX HPV ALL PTH: PAP Smear Comment: 0

## 2015-03-22 LAB — HPV DNA PROBE HIGH RISK, AMPLIFIED: HPV, HIGH-RISK: NEGATIVE

## 2015-04-02 ENCOUNTER — Other Ambulatory Visit: Payer: Self-pay | Admitting: Family Medicine

## 2015-04-02 DIAGNOSIS — G8929 Other chronic pain: Secondary | ICD-10-CM | POA: Insufficient documentation

## 2015-04-02 DIAGNOSIS — M25551 Pain in right hip: Secondary | ICD-10-CM | POA: Insufficient documentation

## 2015-04-02 DIAGNOSIS — M542 Cervicalgia: Secondary | ICD-10-CM | POA: Insufficient documentation

## 2015-04-05 ENCOUNTER — Encounter: Payer: Medicaid Other | Admitting: *Deleted

## 2015-04-05 LAB — HM MAMMOGRAPHY

## 2015-04-11 ENCOUNTER — Ambulatory Visit: Payer: MEDICAID | Admitting: Physical Therapy

## 2015-04-22 ENCOUNTER — Ambulatory Visit: Payer: Medicaid Other | Attending: Anesthesiology | Admitting: Physical Therapy

## 2015-04-22 DIAGNOSIS — M25511 Pain in right shoulder: Secondary | ICD-10-CM | POA: Diagnosis not present

## 2015-04-22 DIAGNOSIS — M25551 Pain in right hip: Secondary | ICD-10-CM | POA: Diagnosis present

## 2015-04-22 NOTE — Therapy (Addendum)
[facility] [address] [place], [place], [address] Phone: [phone]   Fax:  [phone]  Physical Therapy Evaluation  Patient Details  Name: Brianna [name] MRN: [id number] Date of Birth: [birth year] Referring Provider: Romeo Rabon MD  Encounter Date: 04/22/2015      PT End of Session - 04/22/15 1102    Visit Number 1   Number of Visits 1   Date for PT Re-Evaluation 04/22/15  Eval only.   PT Start Time 1030   PT Stop Time 1103   PT Time Calculation (min) 33 min   Activity Tolerance Patient tolerated treatment well   Behavior During Therapy Restless  Patient in obvious pain.      Past Medical History  Diagnosis Date  . Depression   . Hypertension   . Anxiety   . GERD (gastroesophageal reflux disease)   . Headache   . Arthritis   . Chronic right hip pain since 09/2012  . Pancreatitis   . Chronic neck pain   . Chronic back pain   . Anemia     Past Surgical History  Procedure Laterality Date  . Cesarean section    . Oophorectomy    . Colonoscopy N/A 08/22/2013    ONG:EXBMWUX diverticulosis paper hemtochezia from anorectal irritation  . Esophagogastroduodenoscopy N/A 08/22/2013    LKG:MWNUUVO reflux esophagitis/noncritical schatzki's ring and hiatial hernia  . Cholecystectomy N/A 08/16/2014    Procedure: LAPAROSCOPIC CHOLECYSTECTOMY WITH CHOLANGIOGRAM;  Surgeon: Natale Lay, MD;  Location: [facility] ORS;  Service: General;  Laterality: N/A;    There were no vitals filed for this visit.       Subjective Assessment - 04/22/15 1035    Subjective My right hip and shoulder hurt about the same.   Limitations Sitting;Standing   Patient Stated Goals Reduce my pain.   Currently in Pain? Yes   Pain Score 6    Pain Location Shoulder   Pain Orientation Right   Pain Descriptors / Indicators Aching;Throbbing   Pain Type Chronic pain   Pain Onset More than a month ago   Pain Frequency Constant   Aggravating Factors  Sitting;  standing; reaching; walking and sleepin.   Pain Relieving Factors "Nothing."            [facility] PT Assessment - 04/22/15 0001    Assessment   Medical Diagnosis Right shoulder bursitis; trochanteric bursitis.   Referring Provider Romeo Rabon MD   Onset Date/Surgical Date --  Over a year ago.   Hand Dominance Right   Precautions   Precautions None   Restrictions   Weight Bearing Restrictions No   Balance Screen   Has the patient fallen in the past 6 months No   Has the patient had a decrease in activity level because of a fear of falling?  Yes   Is the patient reluctant to leave their home because of a fear of falling?  No   Home Environment   Living Environment Private residence   Prior Function   Level of Independence Independent   Observation/Other Assessments   Observations Having to weight due to pain.   Posture/Postural Control   Posture/Postural Control Postural limitations   Postural Limitations Rounded Shoulders;Forward head;Decreased lumbar lordosis   Posture Comments Unweighting right LE due to pain.   ROM / Strength   AROM / PROM / Strength AROM;Strength   AROM   Overall AROM Comments left active cervical rotation= 55 degrees and left= 45 degrees.  Right shoulder active flexion= 88  degrees and ER= 75 degrees.   Strength   Overall Strength Comments Right shoulderER= 3-/5 limited in part due to pain and IR= 4/5.  Right hip flexion and abduction= 4-/5.   Palpation   Palpation comment Tender over right shoulder posterior cuff region near humeral attachment.  Very tender over right hip trochanteric bursal region.   Special Tests    Special Tests --  Norm DTR's. (+) rt shld impingement testing.   Transfers   Transfers --  Slow and painful.   Ambulation/Gait   Gait Comments The patient walks in spinal flexion due pain.  She unweights her right LE with a decrease in stance time and her right knee remains in flexion.                                 PT Long Term Goals - 04/22/15 1101    PT LONG TERM GOAL #1   Title Eval only.               Plan - 04/22/15 1041    Clinical Impression Statement My right hip started hurting about 4-5 years ago.  It has only gotten worse.  My right shoulder has hurt me over the last year.  It too has gotten.  Injections are tempoary at best.  Sitting, sleeping, walkinf and standing increase my pain.  Resting pain-level is a 5-6/10.  My pain approaches a 10/10 with certain right shoulder movements.   Rehab Potential Poor   PT Frequency --  Eval only.   Consulted and Agree with Plan of Care Patient      Patient will benefit from skilled therapeutic intervention in order to improve the following deficits and impairments:  Pain, Decreased activity tolerance, Decreased range of motion, Decreased strength  Visit Diagnosis: Pain in right shoulder - Plan: PT plan of care cert/re-cert  Pain in right hip - Plan: PT plan of care cert/re-cert     Problem List Patient Active Problem List   Diagnosis Date Noted  . Essential hypertension, benign 02/21/2015  . Insomnia secondary to depression with anxiety 02/21/2015  . Anxiety and depression 01/09/2015  . Cholelithiasis   . Acute pancreatitis 08/05/2014  . Elevated LFTs 08/05/2014  . GERD (gastroesophageal reflux disease) 08/05/2014  . Pancreatitis 08/05/2014  . Adhesive capsulitis of right shoulder 06/08/2014  . Cervical radiculopathy at C7 05/25/2014  . Lumbar disc disease with radiculopathy 02/08/2014  . Inj musc/tend the rotator cuff of right shoulder, init 10/19/2013  . Carpal tunnel syndrome of right wrist 10/19/2013  . Abdominal pain, chronic, epigastric 08/11/2013   PHYSICAL THERAPY DISCHARGE SUMMARY  Visits from Start of Care: 1.  Current functional level related to goals / functional outcomes: Eval only.   Remaining deficits:    Education / Equipment:   Plan: Patient agrees to discharge.  Patient goals were met. Patient  is being discharged due to meeting the stated rehab goals.  ?????      Brianna [name], [place] MPT 04/22/2015, 11:06 AM  [facility] [address] [place], [place], [address] Phone: [phone]   Fax:  [phone]  Name: Brianna [name] MRN: [id number] Date of Birth: 11/09/1960

## 2015-04-23 ENCOUNTER — Encounter: Payer: Self-pay | Admitting: *Deleted

## 2015-04-24 ENCOUNTER — Telehealth: Payer: Self-pay

## 2015-04-24 ENCOUNTER — Other Ambulatory Visit: Payer: Self-pay | Admitting: Family Medicine

## 2015-04-24 NOTE — Telephone Encounter (Signed)
We'll go ahead and print refill and send the other

## 2015-04-24 NOTE — Telephone Encounter (Signed)
Tried to contact patient to inform her that her Tramadol prescription is ready for pick up.  Phone is not working, rang once and then began a fast busy signal.  Contacted her daughter Otila Kluver at [phone], left her a voicemail to call me back and that i had tried to contact her mom and it appeared her phone was out of order.  Prescription is at my desk.

## 2015-04-24 NOTE — Telephone Encounter (Signed)
Last seen 03/15/15  Dr Dettinger   If approved print

## 2015-04-25 ENCOUNTER — Telehealth: Payer: Self-pay

## 2015-04-25 NOTE — Telephone Encounter (Signed)
Contacted patient, informed her via voicemail that prescription for Tramadol is at front desk for pick up.  Phone was working today.

## 2015-05-08 ENCOUNTER — Telehealth: Payer: Self-pay | Admitting: Family Medicine

## 2015-05-08 NOTE — Telephone Encounter (Signed)
Rx removed from box

## 2015-06-29 ENCOUNTER — Emergency Department (HOSPITAL_COMMUNITY)
Admission: EM | Admit: 2015-06-29 | Discharge: 2015-06-29 | Disposition: A | Discharge status: Home / Self Care | Payer: Medicaid Other | Attending: Emergency Medicine | Admitting: Emergency Medicine

## 2015-06-29 ENCOUNTER — Encounter (HOSPITAL_COMMUNITY): Payer: Self-pay

## 2015-06-29 ENCOUNTER — Emergency Department (HOSPITAL_COMMUNITY): Payer: Medicaid Other

## 2015-06-29 DIAGNOSIS — M1990 Unspecified osteoarthritis, unspecified site: Secondary | ICD-10-CM | POA: Diagnosis not present

## 2015-06-29 DIAGNOSIS — R748 Abnormal levels of other serum enzymes: Secondary | ICD-10-CM

## 2015-06-29 DIAGNOSIS — Z87891 Personal history of nicotine dependence: Secondary | ICD-10-CM | POA: Insufficient documentation

## 2015-06-29 DIAGNOSIS — I10 Essential (primary) hypertension: Secondary | ICD-10-CM | POA: Diagnosis not present

## 2015-06-29 DIAGNOSIS — R1013 Epigastric pain: Secondary | ICD-10-CM | POA: Diagnosis present

## 2015-06-29 DIAGNOSIS — F329 Major depressive disorder, single episode, unspecified: Secondary | ICD-10-CM | POA: Diagnosis not present

## 2015-06-29 DIAGNOSIS — E876 Hypokalemia: Secondary | ICD-10-CM | POA: Diagnosis not present

## 2015-06-29 DIAGNOSIS — R109 Unspecified abdominal pain: Secondary | ICD-10-CM

## 2015-06-29 LAB — LIPASE, BLOOD: Lipase: 18 U/L (ref 11–51)

## 2015-06-29 LAB — COMPREHENSIVE METABOLIC PANEL
ALBUMIN: 4.1 g/dL (ref 3.5–5.0)
ALK PHOS: 109 U/L (ref 38–126)
ALT: 58 U/L — ABNORMAL HIGH (ref 14–54)
ANION GAP: 8 (ref 5–15)
AST: 113 U/L — ABNORMAL HIGH (ref 15–41)
BUN: 27 mg/dL — ABNORMAL HIGH (ref 6–20)
CALCIUM: 8.9 mg/dL (ref 8.9–10.3)
CO2: 31 mmol/L (ref 22–32)
Chloride: 100 mmol/L — ABNORMAL LOW (ref 101–111)
Creatinine, Ser: 1.11 mg/dL — ABNORMAL HIGH (ref 0.44–1.00)
GFR calc non Af Amer: 55 mL/min — ABNORMAL LOW (ref >60)
Glucose, Bld: 121 mg/dL — ABNORMAL HIGH (ref 65–99)
POTASSIUM: 3.1 mmol/L — AB (ref 3.5–5.1)
SODIUM: 139 mmol/L (ref 135–145)
TOTAL PROTEIN: 7.1 g/dL (ref 6.5–8.1)
Total Bilirubin: 0.9 mg/dL (ref 0.3–1.2)

## 2015-06-29 LAB — URINALYSIS, ROUTINE W REFLEX MICROSCOPIC
Bilirubin Urine: NEGATIVE
Glucose, UA: NEGATIVE mg/dL
Hgb urine dipstick: NEGATIVE
Ketones, ur: NEGATIVE mg/dL
Leukocytes, UA: NEGATIVE
NITRITE: NEGATIVE
PH: 7 (ref 5.0–8.0)
Protein, ur: NEGATIVE mg/dL

## 2015-06-29 LAB — CBC
HEMATOCRIT: 37.2 % (ref 36.0–46.0)
HEMOGLOBIN: 12.8 g/dL (ref 12.0–15.0)
MCH: 29.6 pg (ref 26.0–34.0)
MCHC: 34.4 g/dL (ref 30.0–36.0)
MCV: 86.1 fL (ref 78.0–100.0)
Platelets: 305 10*3/uL (ref 150–400)
RBC: 4.32 MIL/uL (ref 3.87–5.11)
RDW: 12.3 % (ref 11.5–15.5)
WBC: 9 10*3/uL (ref 4.0–10.5)

## 2015-06-29 MED ORDER — GI COCKTAIL ~~LOC~~
30.0000 mL | Freq: Once | ORAL | Status: AC
  Administered 2015-06-29: 30 mL via ORAL
  Filled 2015-06-29: qty 30

## 2015-06-29 MED ORDER — FAMOTIDINE 20 MG PO TABS: ORAL_TABLET | ORAL | Status: DC

## 2015-06-29 MED ORDER — FAMOTIDINE IN NACL 20-0.9 MG/50ML-% IV SOLN
20.0000 mg | Freq: Once | INTRAVENOUS | Status: AC
  Administered 2015-06-29: 20 mg via INTRAVENOUS
  Filled 2015-06-29: qty 50

## 2015-06-29 MED ORDER — SODIUM CHLORIDE 0.9 % IV SOLN
1000.0000 mL | Freq: Once | INTRAVENOUS | Status: AC
  Administered 2015-06-29: 1000 mL via INTRAVENOUS

## 2015-06-29 MED ORDER — POTASSIUM CHLORIDE CRYS ER 20 MEQ PO TBCR: 20.0000 meq | EXTENDED_RELEASE_TABLET | Freq: Two times a day (BID) | ORAL | Status: DC

## 2015-06-29 MED ORDER — IOPAMIDOL (ISOVUE-300) INJECTION 61%
100.0000 mL | Freq: Once | INTRAVENOUS | Status: AC | PRN
  Administered 2015-06-29: 100 mL via INTRAVENOUS

## 2015-06-29 MED ORDER — SODIUM CHLORIDE 0.9 % IV SOLN: 1000.0000 mL | INTRAVENOUS | Status: DC

## 2015-06-29 MED ORDER — POTASSIUM CHLORIDE CRYS ER 20 MEQ PO TBCR
40.0000 meq | EXTENDED_RELEASE_TABLET | Freq: Once | ORAL | Status: AC
  Administered 2015-06-29: 40 meq via ORAL
  Filled 2015-06-29: qty 2

## 2015-06-29 MED ORDER — FENTANYL CITRATE (PF) 100 MCG/2ML IJ SOLN
50.0000 ug | Freq: Once | INTRAMUSCULAR | Status: AC
  Administered 2015-06-29: 50 ug via INTRAVENOUS
  Filled 2015-06-29: qty 2

## 2015-06-29 MED ORDER — ONDANSETRON 4 MG PO TBDP
4.0000 mg | ORAL_TABLET | Freq: Once | ORAL | Status: AC | PRN
  Administered 2015-06-29: 4 mg via ORAL
  Filled 2015-06-29: qty 1

## 2015-06-29 NOTE — ED Notes (Signed)
Dr Tomi Bamberger in to Brianna [name]

## 2015-06-29 NOTE — Discharge Instructions (Signed)
Take pepcid twice a day with your Dexilant. Avoid fried, spicy or greasy foods. You can take gaviscon or simethicone for excess belching or gas and bloating. Let Dr Roseanne Kaufman office know you had to come to the ED tonight, he may want to see you this week if you aren't improving.      Hypokalemia Hypokalemia means that the amount of potassium in the blood is lower than normal.Potassium is a chemical, called an electrolyte, that helps regulate the amount of fluid in the body. It also stimulates muscle contraction and helps nerves function properly.Most of the body's potassium is inside of cells, and only a very small amount is in the blood. Because the amount in the blood is so small, minor changes can be life-threatening. CAUSES  Antibiotics.  Diarrhea or vomiting.  Using laxatives too much, which can cause diarrhea.  Chronic kidney disease.  Water pills (diuretics).  Eating disorders (bulimia).  Low magnesium level.  Sweating a lot. SIGNS AND SYMPTOMS  Weakness.  Constipation.  Fatigue.  Muscle cramps.  Mental confusion.  Skipped heartbeats or irregular heartbeat (palpitations).  Tingling or numbness. DIAGNOSIS  Your health care provider can diagnose hypokalemia with blood tests. In addition to checking your potassium level, your health care provider may also check other lab tests. TREATMENT Hypokalemia can be treated with potassium supplements taken by mouth or adjustments in your current medicines. If your potassium level is very low, you may need to get potassium through a vein (IV) and be monitored in the hospital. A diet high in potassium is also helpful. Foods high in potassium are:  Nuts, such as peanuts and pistachios.  Seeds, such as sunflower seeds and pumpkin seeds.  Peas, lentils, and lima beans.  Whole grain and bran cereals and breads.  Fresh fruit and vegetables, such as apricots, avocado, bananas, cantaloupe, kiwi, oranges, tomatoes, asparagus, and  potatoes.  Orange and tomato juices.  Red meats.  Fruit yogurt. HOME CARE INSTRUCTIONS  Take all medicines as prescribed by your health care provider.  Maintain a healthy diet by including nutritious food, such as fruits, vegetables, nuts, whole grains, and lean meats.  If you are taking a laxative, be sure to follow the directions on the label. SEEK MEDICAL CARE IF:  Your weakness gets worse.  You feel your heart pounding or racing.  You are vomiting or having diarrhea.  You are diabetic and having trouble keeping your blood glucose in the normal range. SEEK IMMEDIATE MEDICAL CARE IF:  You have chest pain, shortness of breath, or dizziness.  You are vomiting or having diarrhea for more than 2 days.  You faint. MAKE SURE YOU:   Understand these instructions.  Will watch your condition.  Will get help right away if you are not doing well or get worse.   This information is not intended to replace advice given to you by your health care provider. Make sure you discuss any questions you have with your health care provider.   Document Released: 12/22/2004 Document Revised: 01/12/2014 Document Reviewed: 06/24/2012 Elsevier Interactive Patient Education [employer].

## 2015-06-29 NOTE — ED Notes (Signed)
Call to Radiology pt has finished 1st bottle of contrast

## 2015-06-29 NOTE — ED Notes (Signed)
Out of bd to bathroom via wheelchair

## 2015-06-29 NOTE — ED Notes (Signed)
From CT via wheel chair

## 2015-06-29 NOTE — ED Notes (Signed)
Pt reports that she has had abd pain since 1930 after eating steak and baked potato- she ambulates erect, has a soft abd and reports pain to L and R upper abd area

## 2015-06-29 NOTE — ED Notes (Signed)
Upper abd pain x 1 hour with nausea, no vomiting or diarrhea.  Pt states she has had diverticulitis in the past and also pancreatitis.

## 2015-06-29 NOTE — ED Provider Notes (Signed)
CSN: [id number]     Arrival date & time 06/29/15  0024 History   First MD Initiated Contact with Patient 06/29/15 0217 AM   Chief Complaint  Patient presents with  . Abdominal Pain     (Consider location/radiation/quality/duration/timing/severity/associated sxs/prior Treatment) HPI patient reports about 11:30 PM after eating steak and baked potato at 4:30 PM she started having abdominal pain. She states it's diffuse but worse in the epigastric and mildly in the right upper quadrant. She states she's had this pain before with diverticulitis. She also reports last year she had similar pain and she had pancreatitis and then had a cholecystectomy done. She states she still has pain at times about 15-30 minutes after eating certain foods such as bacon, peanuts, peanut butter, or seeds. She states she gets pain in the center of her abdomen and then she gets diarrhea and the pain goes away. She describes the pain today as a burning sensation and has been burping a lot. She has had nausea without vomiting or diarrhea. She denies fever. She states holding her breath or deep breathing makes the pain worse, nothing makes it feel better.   PCP Dr Dettinger GI Dr Gala Romney  Past Medical History  Diagnosis Date  . Depression   . Hypertension   . Anxiety   . GERD (gastroesophageal reflux disease)   . Headache   . Arthritis   . Chronic right hip pain since 09/2012  . Pancreatitis   . Chronic neck pain   . Chronic back pain   . Anemia    Past Surgical History  Procedure Laterality Date  . Cesarean section    . Oophorectomy    . Colonoscopy N/A 08/22/2013    [id number] diverticulosis paper hemtochezia from anorectal irritation  . Esophagogastroduodenoscopy N/A 08/22/2013    [id number] reflux esophagitis/noncritical schatzki's ring and hiatial hernia  . Cholecystectomy N/A 08/16/2014    Procedure: LAPAROSCOPIC CHOLECYSTECTOMY WITH CHOLANGIOGRAM;  Surgeon: Sherri Rad, MD;  Location: [facility] ORS;  Service:  General;  Laterality: N/A;   Family History  Problem Relation Age of Onset  . Colon cancer Neg Hx   . Ovarian cancer Mother   . Gallbladder disease Son    Social History  Substance Use Topics  . Smoking status: Former Smoker -- 0.50 packs/day for 2 years    Types: Cigarettes    Quit date: 01/06/2007  . Smokeless tobacco: Never Used  . Alcohol Use: No   On disability for back and hips OB History    Gravida Para Term Preterm AB TAB SAB Ectopic Multiple Living   4 4 4       4      Review of Systems  All other systems reviewed and are negative.     Allergies  Tylenol with codeine #3  Home Medications   Prior to Admission medications   Medication Sig Start Date End Date Taking? Authorizing Provider  citalopram (CELEXA) 40 MG tablet TAKE ONE TABLET BY MOUTH DAILY 04/24/15  Yes Fransisca Kaufmann Dettinger, MD  dexlansoprazole (DEXILANT) 60 MG capsule Take 1 capsule (60 mg total) by mouth daily. 01/09/15  Yes Fransisca Kaufmann Dettinger, MD  fluticasone (FLONASE) 50 MCG/ACT nasal spray Place 1 spray into both nostrils 2 (two) times daily as needed for allergies or rhinitis. 01/09/15  Yes Fransisca Kaufmann Dettinger, MD  gabapentin (NEURONTIN) 300 MG capsule TAKE ONE CAPSULE BY MOUTH THREE TIMES DAILY. 04/02/15  Yes Fransisca Kaufmann Dettinger, MD  hydrochlorothiazide (HYDRODIURIL) 12.5 MG tablet Take 1 tablet (12.5 mg  total) by mouth daily. 02/21/15  Yes Fransisca Kaufmann Dettinger, MD  ibuprofen (ADVIL,MOTRIN) 600 MG tablet TAKE ONE TABLET BY MOUTH THREE TIMES DAILY. 04/02/15  Yes Fransisca Kaufmann Dettinger, MD  loratadine (CLARITIN) 10 MG tablet Take 1 tablet (10 mg total) by mouth daily. 01/09/15  Yes Fransisca Kaufmann Dettinger, MD  lubiprostone (AMITIZA) 24 MCG capsule Take 1 capsule (24 mcg total) by mouth 2 (two) times daily as needed for constipation. 01/09/15  Yes Fransisca Kaufmann Dettinger, MD  pregabalin (LYRICA) 50 MG capsule Take 50 mg by mouth 3 (three) times daily.   Yes Historical Provider, MD  famotidine (PEPCID) 20 MG tablet Take 1 po BID x 2  weeks then once a day 06/29/15   Rolland Porter, MD  Olopatadine HCl (PAZEO) 0.7 % SOLN Apply 1 drop to eye daily as needed. 02/21/15   Fransisca Kaufmann Dettinger, MD  potassium chloride SA (K-DUR,KLOR-CON) 20 MEQ tablet Take 1 tablet (20 mEq total) by mouth 2 (two) times daily. 06/29/15   Rolland Porter, MD  traMADol (ULTRAM) 50 MG tablet TAKE ONE TABLET BY MOUTH EVERY 8 HOURS AS NEEDED 04/24/15   Fransisca Kaufmann Dettinger, MD  traZODone (DESYREL) 50 MG tablet Take 1-2 tablets (50-100 mg total) by mouth at bedtime as needed for sleep. Patient not taking: Reported on 03/15/2015 02/21/15   Fransisca Kaufmann Dettinger, MD   BP 123/81 mmHg  Pulse 76  Temp(Src) 97.7 F (36.5 C) (Oral)  Resp 16  Ht 5\' 3"  (1.6 m)  Wt 158 lb (71.668 kg)  BMI 28.00 kg/m2  SpO2 98%  LMP 04/14/2012  Vital signs normal   Physical Exam  Constitutional: She is oriented to person, place, and time. She appears well-developed and well-nourished.  Non-toxic appearance. She does not appear ill. No distress.  HENT:  Head: Normocephalic and atraumatic.  Right Ear: External ear normal.  Left Ear: External ear normal.  Nose: Nose normal. No mucosal edema or rhinorrhea.  Mouth/Throat: Oropharynx is clear and moist and mucous membranes are normal. No dental abscesses or uvula swelling.  Eyes: Conjunctivae and EOM are normal. Pupils are equal, round, and reactive to light.  Neck: Normal range of motion and full passive range of motion without pain. Neck supple.  Cardiovascular: Normal rate, regular rhythm and normal heart sounds.  Exam reveals no gallop and no friction rub.   No murmur heard. Pulmonary/Chest: Effort normal and breath sounds normal. No respiratory distress. She has no wheezes. She has no rhonchi. She has no rales. She exhibits no tenderness and no crepitus.  Abdominal: Soft. Normal appearance and bowel sounds are normal. She exhibits no distension. There is tenderness in the epigastric area. There is no rebound and no guarding.  Musculoskeletal:  Normal range of motion. She exhibits no edema or tenderness.  Moves all extremities well.   Neurological: She is alert and oriented to person, place, and time. She has normal strength. No cranial nerve deficit.  Skin: Skin is warm, dry and intact. No rash noted. No erythema. No pallor.  Psychiatric: She has a normal mood and affect. Her speech is normal and behavior is normal. Her mood appears not anxious.  Nursing note and vitals reviewed.   ED Course  Procedures (including critical care time)  Medications  0.9 %  sodium chloride infusion (1,000 mLs Intravenous New Bag/Given 06/29/15 0249)    Followed by  0.9 %  sodium chloride infusion (1,000 mLs Intravenous New Bag/Given 06/29/15 0256)    Followed by  0.9 %  sodium  chloride infusion (not administered)  ondansetron (ZOFRAN-ODT) disintegrating tablet 4 mg (4 mg Oral Given 06/29/15 0135)  fentaNYL (SUBLIMAZE) injection 50 mcg (50 mcg Intravenous Given 06/29/15 0256)  famotidine (PEPCID) IVPB 20 mg premix (0 mg Intravenous Stopped 06/29/15 0320)  iopamidol (ISOVUE-300) 61 % injection 100 mL (100 mLs Intravenous Contrast Given 06/29/15 0415)  potassium chloride SA (K-DUR,KLOR-CON) CR tablet 40 mEq (40 mEq Oral Given 06/29/15 0522)  gi cocktail (Maalox,Lidocaine,Donnatal) (30 mLs Oral Given 06/29/15 0524)    Patient was given IV fluids, IV pain medication with nausea medicine and IV Pepcid. We reviewed her laboratory testing and with her elevated LFTs a CT of her abdomen and pelvis was done.  Patient was rechecked at 5 AM. She states she is having a lot of belching and some acid reflux. But her abdominal pain that she had when she came in is improved. She was given a GI cocktail, she had already gotten IV Pepcid.  5:50 AM patient states she's feeling much improved and feels ready to be discharged. She is to continue her dexillant and I will add Pepcid to her acid regimen. She also is advised to take Gaviscon or any simethicone product  over-the-counter for belching and gas. She can f/u this week with her GI if not improving.  She was discharged home on potassium pills for 5 days.   Labs Review Results for orders placed or performed during the hospital encounter of 06/29/15  Lipase, blood  Result Value Ref Range   Lipase 18 11 - 51 U/L  Comprehensive metabolic panel  Result Value Ref Range   Sodium 139 135 - 145 mmol/L   Potassium 3.1 (L) 3.5 - 5.1 mmol/L   Chloride 100 (L) 101 - 111 mmol/L   CO2 31 22 - 32 mmol/L   Glucose, Bld 121 (H) 65 - 99 mg/dL   BUN 27 (H) 6 - 20 mg/dL   Creatinine, Ser 1.11 (H) 0.44 - 1.00 mg/dL   Calcium 8.9 8.9 - 10.3 mg/dL   Total Protein 7.1 6.5 - 8.1 g/dL   Albumin 4.1 3.5 - 5.0 g/dL   AST 113 (H) 15 - 41 U/L   ALT 58 (H) 14 - 54 U/L   Alkaline Phosphatase 109 38 - 126 U/L   Total Bilirubin 0.9 0.3 - 1.2 mg/dL   GFR calc non Af Amer 55 (L) >60 mL/min   GFR calc Af Amer >60 >60 mL/min   Anion gap 8 5 - 15  CBC  Result Value Ref Range   WBC 9.0 4.0 - 10.5 K/uL   RBC 4.32 3.87 - 5.11 MIL/uL   Hemoglobin 12.8 12.0 - 15.0 g/dL   HCT 37.2 36.0 - 46.0 %   MCV 86.1 78.0 - 100.0 fL   MCH 29.6 26.0 - 34.0 pg   MCHC 34.4 30.0 - 36.0 g/dL   RDW 12.3 11.5 - 15.5 %   Platelets 305 150 - 400 K/uL  Urinalysis, Routine w reflex microscopic  Result Value Ref Range   Color, Urine YELLOW YELLOW   APPearance CLEAR CLEAR   Specific Gravity, Urine <1.005 (L) 1.005 - 1.030   pH 7.0 5.0 - 8.0   Glucose, UA NEGATIVE NEGATIVE mg/dL   Hgb urine dipstick NEGATIVE NEGATIVE   Bilirubin Urine NEGATIVE NEGATIVE   Ketones, ur NEGATIVE NEGATIVE mg/dL   Protein, ur NEGATIVE NEGATIVE mg/dL   Nitrite NEGATIVE NEGATIVE   Leukocytes, UA NEGATIVE NEGATIVE    Laboratory interpretation all normal except Minor elevation of LFTs,  hypokalemia, mild renal insufficiency   Imaging Review Ct Abdomen Pelvis W Contrast  06/29/2015  CLINICAL DATA:  55 year old female with epigastric abdominal pain. Status post  cholecystectomy. EXAM: CT ABDOMEN AND PELVIS WITH CONTRAST TECHNIQUE: Multidetector CT imaging of the abdomen and pelvis was performed using the standard protocol following bolus administration of intravenous contrast. CONTRAST:  131mL ISOVUE-300 IOPAMIDOL (ISOVUE-300) INJECTION 61% COMPARISON:  CT dated 08/05/2014 FINDINGS: The visualized lung bases are clear. No intra-abdominal free air or free fluid. Cholecystectomy. There is apparent minimal hypo enhancement of the uncinate process of the pancreas, likely artifactual. Correlation with pancreatic enzymes recommended to exclude pancreatitis. No peripancreatic stranding noted. The liver, spleen, and adrenal glands appear unremarkable. There is an extrarenal right pelvis. There is mild fullness of the renal collecting systems bilaterally, increased from prior study. The visualized ureters and urinary bladder appear unremarkable. The uterus and ovaries are grossly unremarkable. There is sigmoid diverticulosis. Mild haziness of the perisigmoid fat likely chronic inflammation/ scarring. No definite active inflammatory change identified. Correlation with clinical exam recommended. There is no evidence of bowel obstruction. Normal appendix. The abdominal aorta and IVC appear unremarkable. No portal venous gas identified. There is no adenopathy. There is a small fat containing umbilical hernia. The abdominal wall soft tissues are otherwise unremarkable. There is degenerative changes of the spine. No acute fracture. IMPRESSION: Mild fullness of the renal collecting systems. No obstructing stone identified. Correlation with urinalysis recommended to exclude UTI. Sigmoid diverticulosis. No evidence of bowel obstruction or active inflammation. Normal appendix. Apparent focal slight hypo enhancement of the uncinate process of the pancreas, likely artifactual. Correlation with pancreatic enzymes recommended to exclude pancreatitis. Electronically Signed   By: Anner Crete  M.D.   On: 06/29/2015 04:32   I have personally reviewed and evaluated these images and lab results as part of my medical decision-making.    MDM   Final diagnoses:  Hypokalemia  Abdominal pain, unspecified abdominal location  Elevated liver enzymes    New Prescriptions   FAMOTIDINE (PEPCID) 20 MG TABLET    Take 1 po BID x 2 weeks then once a day   POTASSIUM CHLORIDE SA (K-DUR,KLOR-CON) 20 MEQ TABLET    Take 1 tablet (20 mEq total) by mouth 2 (two) times daily.    Plan discharge  Rolland Porter, MD, Barbette Or, MD 06/29/15 [phone]

## 2015-07-26 ENCOUNTER — Other Ambulatory Visit: Payer: Self-pay | Admitting: Family Medicine

## 2015-09-04 ENCOUNTER — Encounter: Payer: Self-pay | Admitting: Internal Medicine

## 2015-09-11 ENCOUNTER — Other Ambulatory Visit: Payer: Self-pay | Admitting: Family Medicine

## 2015-09-11 ENCOUNTER — Ambulatory Visit: Payer: Medicaid Other | Admitting: Family Medicine

## 2015-09-20 ENCOUNTER — Ambulatory Visit (INDEPENDENT_AMBULATORY_CARE_PROVIDER_SITE_OTHER): Payer: Medicaid Other

## 2015-09-20 ENCOUNTER — Encounter: Payer: Self-pay | Admitting: Family Medicine

## 2015-09-20 ENCOUNTER — Ambulatory Visit (INDEPENDENT_AMBULATORY_CARE_PROVIDER_SITE_OTHER): Payer: Medicaid Other | Admitting: Family Medicine

## 2015-09-20 VITALS — BP 134/85 | HR 80 | Temp 98.2°F | Ht 63.0 in | Wt 169.5 lb

## 2015-09-20 DIAGNOSIS — F419 Anxiety disorder, unspecified: Secondary | ICD-10-CM

## 2015-09-20 DIAGNOSIS — F418 Other specified anxiety disorders: Secondary | ICD-10-CM

## 2015-09-20 DIAGNOSIS — M7061 Trochanteric bursitis, right hip: Secondary | ICD-10-CM

## 2015-09-20 DIAGNOSIS — F32A Depression, unspecified: Secondary | ICD-10-CM

## 2015-09-20 DIAGNOSIS — M25551 Pain in right hip: Secondary | ICD-10-CM

## 2015-09-20 DIAGNOSIS — F329 Major depressive disorder, single episode, unspecified: Secondary | ICD-10-CM

## 2015-09-20 MED ORDER — HYDROXYZINE HCL 50 MG PO TABS: 50.0000 mg | ORAL_TABLET | Freq: Three times a day (TID) | ORAL | 0 refills | Status: DC | PRN

## 2015-09-20 MED ORDER — METHYLPREDNISOLONE ACETATE 80 MG/ML IJ SUSP
80.0000 mg | Freq: Once | INTRAMUSCULAR | Status: AC
  Administered 2015-09-20: 80 mg via INTRAMUSCULAR

## 2015-09-20 NOTE — Progress Notes (Signed)
BP 134/85   Pulse 80   Temp 98.2 F (36.8 C) (Oral)   Ht 5\' 3"  (1.6 m)   Wt 169 lb 8 oz (76.9 kg)   LMP 04/14/2012   BMI 30.03 kg/m    Subjective:    Patient ID: Brianna [name], female    DOB: 1960/04/07, 55 y.o.   MRN: [id number]  HPI: Brianna [name] is a 55 y.o. female presenting on 09/20/2015 for Swelling in right leg and foot; Right Hip Pain (fell 5 days ago); and Anxiety (close friend was murdered this week, patient reports Citalopram is not helping during this time)   HPI Right leg pain and swelling Patient is coming today with right leg pain and hip pain and feels like she has some swelling going down into her leg from the hip. She has seen her pain management specialist and they told her it was a bursitis but she wanted to come today to get it further examined. She says the pain does prevent her from laying on that side and she has some difficulties with ambulation because of the pain when he gets really tight. She denies any fevers or chills or overlying redness or warmth.  Anxiety increased Patient is having increased anxiety over the past week because she has been dealing with her ex-boyfriend and friend who was recently murdered to rob him of his narcotics. She says this is increased her stress and she feels like the anxiety medication that she is normally on is not helping as much. She denies any suicidal ideations or feelings of hopelessness or helplessness. She is just more having a lot of issues because she is the main and only support for this man's mother who is dealing with this loss.  Relevant past medical, surgical, family and social history reviewed and updated as indicated. Interim medical history since our last visit reviewed. Allergies and medications reviewed and updated.  Review of Systems  Constitutional: Negative for chills and fever.  HENT: Negative for congestion, ear discharge and ear pain.   Eyes: Negative for redness and visual disturbance.    Respiratory: Negative for chest tightness and shortness of breath.   Cardiovascular: Negative for chest pain and leg swelling.  Genitourinary: Negative for difficulty urinating and dysuria.  Musculoskeletal: Positive for arthralgias, gait problem and joint swelling. Negative for back pain.  Skin: Negative for rash.  Neurological: Negative for light-headedness and headaches.  Psychiatric/Behavioral: Positive for dysphoric mood and sleep disturbance. Negative for agitation, behavioral problems, self-injury and suicidal ideas. The patient is nervous/anxious.   All other systems reviewed and are negative.   Per HPI unless specifically indicated above      Objective:    BP 134/85   Pulse 80   Temp 98.2 F (36.8 C) (Oral)   Ht 5\' 3"  (1.6 m)   Wt 169 lb 8 oz (76.9 kg)   LMP 04/14/2012   BMI 30.03 kg/m   Wt Readings from Last 3 Encounters:  09/20/15 169 lb 8 oz (76.9 kg)  06/29/15 158 lb (71.7 kg)  03/15/15 158 lb 3.2 oz (71.8 kg)    Physical Exam  Constitutional: She is oriented to person, place, and time. She appears well-developed and well-nourished. No distress.  Eyes: Conjunctivae are normal.  Cardiovascular: Normal rate, regular rhythm, normal heart sounds and intact distal pulses.   No murmur heard. Pulmonary/Chest: Effort normal and breath sounds normal. No respiratory distress. She has no wheezes.  Musculoskeletal: Normal range of motion. She exhibits no  edema.       Right hip: She exhibits tenderness (Tenderness over lateral aspect of right hip, consistent with trochanteric bursitis). She exhibits normal range of motion, normal strength and no laceration.  Neurological: She is alert and oriented to person, place, and time. Coordination normal.  Skin: Skin is warm and dry. No rash noted. She is not diaphoretic.  Psychiatric: She has a normal mood and affect. Her behavior is normal.  Nursing note and vitals reviewed.   Trochanteric bursa injection: Risk factors of  bleeding and infection discussed with patient and patient is agreeable towards injection. Patient prepped with Betadine. Lateral approach towards injection used. Injected 80 mg of Depo-Medrol and 1 mL of 2% lidocaine. Patient tolerated procedure well and no side effects from noted. Minimal to no bleeding. Simple bandage applied after.     Assessment & Plan:   Problem List Items Addressed This Visit      Other   Anxiety and depression   Relevant Medications   hydrOXYzine (ATARAX/VISTARIL) 50 MG tablet    Other Visit Diagnoses    Trochanteric bursitis of right hip    -  Primary   Relevant Medications   methylPREDNISolone acetate (DEPO-MEDROL) injection 80 mg (Completed)   Other Relevant Orders   DG HIP UNILAT W OR W/O PELVIS 2-3 VIEWS RIGHT       Follow up plan: Return if symptoms worsen or fail to improve.  Counseling provided for all of the vaccine components Orders Placed This Encounter  Procedures  . DG HIP UNILAT W OR W/O PELVIS 2-3 VIEWS RIGHT    Caryl Pina, MD [facility] 09/20/2015, 11:50 AM

## 2015-10-22 ENCOUNTER — Other Ambulatory Visit: Payer: Self-pay | Admitting: Family Medicine

## 2016-01-15 ENCOUNTER — Encounter (HOSPITAL_COMMUNITY): Payer: Self-pay | Admitting: Emergency Medicine

## 2016-01-15 ENCOUNTER — Emergency Department (HOSPITAL_COMMUNITY)
Admission: EM | Admit: 2016-01-15 | Discharge: 2016-01-15 | Disposition: A | Discharge status: Home / Self Care | Payer: Medicare HMO | Attending: Emergency Medicine | Admitting: Emergency Medicine

## 2016-01-15 DIAGNOSIS — Z87891 Personal history of nicotine dependence: Secondary | ICD-10-CM | POA: Insufficient documentation

## 2016-01-15 DIAGNOSIS — I10 Essential (primary) hypertension: Secondary | ICD-10-CM | POA: Insufficient documentation

## 2016-01-15 DIAGNOSIS — Z79899 Other long term (current) drug therapy: Secondary | ICD-10-CM | POA: Diagnosis not present

## 2016-01-15 DIAGNOSIS — M545 Low back pain, unspecified: Secondary | ICD-10-CM

## 2016-01-15 HISTORY — DX: Fibromyalgia: M79.7

## 2016-01-15 HISTORY — DX: Diverticulitis of intestine, part unspecified, without perforation or abscess without bleeding: K57.92

## 2016-01-15 HISTORY — DX: Other cervical disc degeneration, unspecified cervical region: M50.30

## 2016-01-15 LAB — URINALYSIS, ROUTINE W REFLEX MICROSCOPIC
Bilirubin Urine: NEGATIVE
Glucose, UA: NEGATIVE mg/dL
HGB URINE DIPSTICK: NEGATIVE
Ketones, ur: NEGATIVE mg/dL
LEUKOCYTES UA: NEGATIVE
Nitrite: NEGATIVE
Protein, ur: NEGATIVE mg/dL
SPECIFIC GRAVITY, URINE: 1.013 (ref 1.005–1.030)
pH: 5 (ref 5.0–8.0)

## 2016-01-15 MED ORDER — ACETAMINOPHEN 500 MG PO TABS: 500.0000 mg | ORAL_TABLET | Freq: Four times a day (QID) | ORAL | 0 refills | Status: DC | PRN

## 2016-01-15 MED ORDER — METHOCARBAMOL 500 MG PO TABS: 500.0000 mg | ORAL_TABLET | Freq: Two times a day (BID) | ORAL | 0 refills | Status: DC

## 2016-01-15 NOTE — ED Triage Notes (Signed)
Pt reports left lower back pain  starting Monday, pt has been urinating less.  No new injuries, but does have "disc issues".   Pt denies n/v/d.  Alert and oriented.

## 2016-01-15 NOTE — ED Provider Notes (Signed)
[facility] DEPT Provider Note   CSN: [id number] Arrival date & time: 01/15/16  1734     History   Chief Complaint Chief Complaint  Patient presents with  . Back Pain    HPI Brianna [name] is a 56 y.o. female.  Brianna [name] is a 56 y.o. Female who presents to the ED complaining of left low back pain for the past week. Patient reports her pain is worse with movement and walking. She reports a history of degenerative disk disease and has problems with back pain. She reports her pain is worse on her left side. She denies any injury or falls. She also reports she has urinated less today. She reports urinating twice today. She denies other urinary symptoms. She denies dysuria, hematuria, urinary frequency or urinary urgency. She denies any difficulty urinating. She denies any loss of bowel or bladder control. She denies any pain to her upper back. She denies colicky pain. No history of kidney stones. Patient denies history of cancer or IV drug use. She denies rapid changes to her weight. She denies fevers, numbness, tingling, weakness, abdominal pain, nausea, vomiting, diarrhea, or rashes.   The history is provided by the patient. No language interpreter was used.  Back Pain   Pertinent negatives include no fever, no numbness, no headaches, no abdominal pain, no dysuria and no weakness.    Past Medical History:  Diagnosis Date  . Anemia   . Anxiety   . Arthritis   . Chronic back pain   . Chronic neck pain   . Chronic right hip pain since 09/2012  . DDD (degenerative disc disease), cervical   . Depression   . Diverticulitis   . Fibromyalgia   . GERD (gastroesophageal reflux disease)   . Headache   . Hypertension   . Pancreatitis     Patient Active Problem List   Diagnosis Date Noted  . Essential hypertension, benign 02/21/2015  . Insomnia secondary to depression with anxiety 02/21/2015  . Anxiety and depression 01/09/2015  . Cholelithiasis   . Acute  pancreatitis 08/05/2014  . Elevated LFTs 08/05/2014  . GERD (gastroesophageal reflux disease) 08/05/2014  . Pancreatitis 08/05/2014  . Adhesive capsulitis of right shoulder 06/08/2014  . Cervical radiculopathy at C7 05/25/2014  . Lumbar disc disease with radiculopathy 02/08/2014  . Inj musc/tend the rotator cuff of right shoulder, init 10/19/2013  . Carpal tunnel syndrome of right wrist 10/19/2013  . Abdominal pain, chronic, epigastric 08/11/2013    Past Surgical History:  Procedure Laterality Date  . CESAREAN SECTION    . CHOLECYSTECTOMY N/A 08/16/2014   Procedure: LAPAROSCOPIC CHOLECYSTECTOMY WITH CHOLANGIOGRAM;  Surgeon: Sherri Rad, MD;  Location: [facility] ORS;  Service: General;  Laterality: N/A;  . COLONOSCOPY N/A 08/22/2013   [id number] diverticulosis paper hemtochezia from anorectal irritation  . ESOPHAGOGASTRODUODENOSCOPY N/A 08/22/2013   [id number] reflux esophagitis/noncritical schatzki's ring and hiatial hernia  . OOPHORECTOMY      OB History    Gravida Para Term Preterm AB Living   4 4 4     4    SAB TAB Ectopic Multiple Live Births                   Home Medications    Prior to Admission medications   Medication Sig Start Date End Date Taking? Authorizing Provider  acetaminophen (TYLENOL) 500 MG tablet Take 1 tablet (500 mg total) by mouth every 6 (six) hours as needed for mild pain or moderate pain. 01/15/16   Gwyndolyn Saxon  Rett Stehlik, PA-C  citalopram (CELEXA) 40 MG tablet TAKE ONE TABLET BY MOUTH DAILY 10/23/15   Fransisca Kaufmann Dettinger, MD  dexlansoprazole (DEXILANT) 60 MG capsule Take 1 capsule (60 mg total) by mouth daily. 01/09/15   Fransisca Kaufmann Dettinger, MD  famotidine (PEPCID) 20 MG tablet Take 1 po BID x 2 weeks then once a day 06/29/15   Rolland Porter, MD  fluticasone (FLONASE) 50 MCG/ACT nasal spray Place 1 spray into both nostrils 2 (two) times daily as needed for allergies or rhinitis. 01/09/15   Fransisca Kaufmann Dettinger, MD  hydrochlorothiazide (HYDRODIURIL) 12.5 MG tablet Take 1 tablet  (12.5 mg total) by mouth daily. 02/21/15   Fransisca Kaufmann Dettinger, MD  hydrOXYzine (ATARAX/VISTARIL) 50 MG tablet Take 1 tablet (50 mg total) by mouth 3 (three) times daily as needed. 09/20/15   Fransisca Kaufmann Dettinger, MD  loratadine (CLARITIN) 10 MG tablet Take 1 tablet (10 mg total) by mouth daily. 01/09/15   Fransisca Kaufmann Dettinger, MD  lubiprostone (AMITIZA) 24 MCG capsule Take 1 capsule (24 mcg total) by mouth 2 (two) times daily as needed for constipation. 01/09/15   Fransisca Kaufmann Dettinger, MD  methocarbamol (ROBAXIN) 500 MG tablet Take 1 tablet (500 mg total) by mouth 2 (two) times daily. 01/15/16   Waynetta Pean, PA-C  Olopatadine HCl (PAZEO) 0.7 % SOLN Apply 1 drop to eye daily as needed. 02/21/15   Fransisca Kaufmann Dettinger, MD  potassium chloride SA (K-DUR,KLOR-CON) 20 MEQ tablet Take 1 tablet (20 mEq total) by mouth 2 (two) times daily. 06/29/15   Rolland Porter, MD  pregabalin (LYRICA) 100 MG capsule Take 100 mg by mouth. One in the morning, 2 at bedtime    Historical Provider, MD    Family History Family History  Problem Relation Age of Onset  . Ovarian cancer Mother   . Gallbladder disease Son   . Colon cancer Neg Hx     Social History Social History  Substance Use Topics  . Smoking status: Former Smoker    Packs/day: 0.50    Years: 2.00    Types: Cigarettes    Quit date: 01/06/2007  . Smokeless tobacco: Never Used  . Alcohol use No     Allergies   Tylenol with codeine #3 [acetaminophen-codeine]   Review of Systems Review of Systems  Constitutional: Negative for chills and fever.  Eyes: Negative for visual disturbance.  Gastrointestinal: Negative for abdominal pain, diarrhea, nausea and vomiting.  Genitourinary: Positive for decreased urine volume. Negative for difficulty urinating, dysuria, flank pain, frequency, hematuria, urgency, vaginal bleeding and vaginal discharge.  Musculoskeletal: Positive for back pain. Negative for gait problem and neck pain.  Skin: Negative for rash.  Neurological:  Negative for weakness, light-headedness, numbness and headaches.     Physical Exam Updated Vital Signs BP 122/93 (BP Location: Left Arm)   Pulse 80   Temp 98.2 F (36.8 C) (Oral)   Resp 16   Ht 5\' 3"  (1.6 m)   Wt 74.8 kg   LMP 04/14/2012   SpO2 98%   BMI 29.23 kg/m   Physical Exam  Constitutional: She appears well-developed and well-nourished. No distress.  Nontoxic appearing.  HENT:  Head: Normocephalic and atraumatic.  Eyes: Right eye exhibits no discharge. Left eye exhibits no discharge.  Cardiovascular: Normal rate and intact distal pulses.   Pulmonary/Chest: Effort normal. No respiratory distress.  Abdominal: Soft. She exhibits no mass. There is no tenderness. There is no guarding.  No CVA or flank tenderness.  Musculoskeletal: Normal range of  motion. She exhibits tenderness. She exhibits no edema or deformity.  Mild tenderness across her left low back musculature. No midline neck or back tenderness. No back erythema, deformity, ecchymosis or warmth. Good strength to her bilateral lower extremities. No calf edema or tenderness.  Neurological: She is alert. She displays normal reflexes. No sensory deficit. Coordination normal.  Patient is alert and oriented 3. Sensation is intact to her bilateral lower extremities. Normal gait. Bilateral patellar DTRs are intact.  Skin: Skin is warm and dry. Capillary refill takes less than 2 seconds. No rash noted. She is not diaphoretic. No erythema. No pallor.  Psychiatric: She has a normal mood and affect. Her behavior is normal.  Nursing note and vitals reviewed.    ED Treatments / Results  Labs (all labs ordered are listed, but only abnormal results are displayed) Labs Reviewed  URINALYSIS, ROUTINE W REFLEX MICROSCOPIC    EKG  EKG Interpretation None       Radiology No results found.  Procedures Procedures (including critical care time)  Medications Ordered in ED Medications - No data to display   Initial  Impression / Assessment and Plan / ED Course  I have reviewed the triage vital signs and the nursing notes.  Pertinent labs & imaging results that were available during my care of the patient were reviewed by me and considered in my medical decision making (see chart for details).  Clinical Course    This is a 56 y.o. Female who presents to the ED complaining of left low back pain for the past week. Patient reports her pain is worse with movement and walking. She reports a history of degenerative disk disease and has problems with back pain. She reports her pain is worse on her left side. She denies any injury or falls. She also reports she has urinated less today. She reports urinating twice today. She denies other urinary symptoms. She denies dysuria, hematuria, urinary frequency or urinary urgency. She denies any difficulty urinating. She denies any loss of bowel or bladder control. She denies any pain to her upper back. She denies colicky pain. No history of kidney stones.  On exam the patient is afebrile nontoxic appearing. She is tenderness to her left low back musculature. No midline neck or back tenderness. No overlying skin changes. No neurological deficits. Normal gait. Abdomen is soft and nontender. No CVA or flank tenderness. Patient's tenderness is lower. Urinalysis is without signs of infection. No signs of dehydration. No hemoglobin. I have low suspicion for this being kidney stones that she has constant pain that is not colicky in nature. This is also not flank pain. Patient reports urinating twice today. I encouraged her to increase her oral intake. We will attempt Tylenol and Robaxin for her symptoms and encouraged her to follow-up closely with her primary care doctor. Advised if her symptoms persist they may want to do further testing. I discussed she can also return to the emergency department for further evaluation of her symptoms persist or worsen. I advised the patient to follow-up  with their primary care provider this week. I advised the patient to return to the emergency department with new or worsening symptoms or new concerns. The patient verbalized understanding and agreement with plan.     Final Clinical Impressions(s) / ED Diagnoses   Final diagnoses:  Left-sided low back pain without sciatica, unspecified chronicity    New Prescriptions New Prescriptions   ACETAMINOPHEN (TYLENOL) 500 MG TABLET    Take 1 tablet (500  mg total) by mouth every 6 (six) hours as needed for mild pain or moderate pain.   METHOCARBAMOL (ROBAXIN) 500 MG TABLET    Take 1 tablet (500 mg total) by mouth 2 (two) times daily.     Waynetta Pean, PA-C 01/15/16 [place], MD 01/15/16 [id number]

## 2016-01-16 ENCOUNTER — Other Ambulatory Visit: Payer: Self-pay | Admitting: Family Medicine

## 2016-01-25 ENCOUNTER — Other Ambulatory Visit: Payer: Self-pay | Admitting: Family Medicine

## 2016-01-25 DIAGNOSIS — J069 Acute upper respiratory infection, unspecified: Secondary | ICD-10-CM

## 2016-03-18 ENCOUNTER — Other Ambulatory Visit: Payer: Self-pay | Admitting: Family Medicine

## 2016-03-18 DIAGNOSIS — I10 Essential (primary) hypertension: Secondary | ICD-10-CM

## 2016-03-19 NOTE — Telephone Encounter (Signed)
We will go ahead and filled but patient is likely due for an appointment.

## 2016-04-14 DIAGNOSIS — M25551 Pain in right hip: Secondary | ICD-10-CM | POA: Diagnosis not present

## 2016-04-14 DIAGNOSIS — Z79899 Other long term (current) drug therapy: Secondary | ICD-10-CM | POA: Diagnosis not present

## 2016-04-14 DIAGNOSIS — M545 Low back pain: Secondary | ICD-10-CM | POA: Diagnosis not present

## 2016-04-14 DIAGNOSIS — Z5181 Encounter for therapeutic drug level monitoring: Secondary | ICD-10-CM | POA: Diagnosis not present

## 2016-04-14 DIAGNOSIS — M79604 Pain in right leg: Secondary | ICD-10-CM | POA: Diagnosis not present

## 2016-04-14 DIAGNOSIS — R69 Illness, unspecified: Secondary | ICD-10-CM | POA: Diagnosis not present

## 2016-04-28 ENCOUNTER — Other Ambulatory Visit: Payer: Self-pay | Admitting: Family Medicine

## 2016-04-28 DIAGNOSIS — J069 Acute upper respiratory infection, unspecified: Secondary | ICD-10-CM

## 2016-05-05 ENCOUNTER — Encounter: Payer: Medicaid Other | Admitting: Family Medicine

## 2016-05-06 ENCOUNTER — Encounter: Payer: Self-pay | Admitting: Family Medicine

## 2016-05-06 ENCOUNTER — Telehealth: Payer: Self-pay | Admitting: Family Medicine

## 2016-05-06 NOTE — Telephone Encounter (Signed)
Contacted patient, was unable to reach

## 2016-06-16 ENCOUNTER — Other Ambulatory Visit: Payer: Self-pay | Admitting: Family Medicine

## 2016-06-16 DIAGNOSIS — J069 Acute upper respiratory infection, unspecified: Secondary | ICD-10-CM

## 2016-06-17 NOTE — Telephone Encounter (Signed)
Patient needs an appointment, we can give her enough until the appointment but she needs to be seen

## 2016-06-19 ENCOUNTER — Other Ambulatory Visit: Payer: Self-pay | Admitting: Family Medicine

## 2016-06-19 DIAGNOSIS — J069 Acute upper respiratory infection, unspecified: Secondary | ICD-10-CM

## 2016-07-01 ENCOUNTER — Encounter (INDEPENDENT_AMBULATORY_CARE_PROVIDER_SITE_OTHER): Payer: Self-pay

## 2016-07-01 ENCOUNTER — Ambulatory Visit (INDEPENDENT_AMBULATORY_CARE_PROVIDER_SITE_OTHER): Payer: Medicare HMO | Admitting: Family Medicine

## 2016-07-01 ENCOUNTER — Encounter: Payer: Self-pay | Admitting: Family Medicine

## 2016-07-01 VITALS — BP 111/73 | HR 85 | Temp 97.9°F | Ht 63.0 in | Wt 169.4 lb

## 2016-07-01 DIAGNOSIS — F32A Depression, unspecified: Secondary | ICD-10-CM

## 2016-07-01 DIAGNOSIS — F329 Major depressive disorder, single episode, unspecified: Secondary | ICD-10-CM | POA: Diagnosis not present

## 2016-07-01 DIAGNOSIS — F419 Anxiety disorder, unspecified: Secondary | ICD-10-CM | POA: Diagnosis not present

## 2016-07-01 DIAGNOSIS — Z13220 Encounter for screening for lipoid disorders: Secondary | ICD-10-CM

## 2016-07-01 DIAGNOSIS — K219 Gastro-esophageal reflux disease without esophagitis: Secondary | ICD-10-CM

## 2016-07-01 DIAGNOSIS — I10 Essential (primary) hypertension: Secondary | ICD-10-CM | POA: Diagnosis not present

## 2016-07-01 DIAGNOSIS — R69 Illness, unspecified: Secondary | ICD-10-CM | POA: Diagnosis not present

## 2016-07-01 MED ORDER — DEXLANSOPRAZOLE 60 MG PO CPDR: 1.0000 | DELAYED_RELEASE_CAPSULE | Freq: Every day | ORAL | 3 refills | Status: DC

## 2016-07-01 MED ORDER — CITALOPRAM HYDROBROMIDE 40 MG PO TABS: 40.0000 mg | ORAL_TABLET | Freq: Every day | ORAL | 3 refills | Status: DC

## 2016-07-01 MED ORDER — HYDROCHLOROTHIAZIDE 12.5 MG PO CAPS: 12.5000 mg | ORAL_CAPSULE | Freq: Every day | ORAL | 3 refills | Status: DC

## 2016-07-01 MED ORDER — LORATADINE 10 MG PO TABS: 10.0000 mg | ORAL_TABLET | Freq: Every day | ORAL | 4 refills | Status: DC

## 2016-07-01 MED ORDER — FLUTICASONE PROPIONATE 50 MCG/ACT NA SUSP: 1.0000 | Freq: Two times a day (BID) | NASAL | 6 refills | Status: DC | PRN

## 2016-07-01 NOTE — Progress Notes (Signed)
BP 111/73   Pulse 85   Temp 97.9 F (36.6 C) (Oral)   Ht _0  (1.6 m)   Wt 169 lb 6 oz (76.8 kg)   LMP 04/14/2012   BMI 30.00 kg/m    Subjective:    Patient ID: Brianna [name], female    DOB: Jan 15, 1960, 56 y.o.   MRN: [id number]  HPI: Brianna [name] is a 56 y.o. female presenting on 07/01/2016 for Gastroesophageal Reflux (followup); Hypertension; and Depression, Anxiety   HPI Hypertension Patient is currently on hydrochlorothiazide, and their blood pressure today is 111/73. Patient denies any lightheadedness or dizziness. Patient denies headaches, blurred vision, chest pains, shortness of breath, or weakness. Denies any side effects from medication and is content with current medication.   Anxiety and depression recheck Patient is coming in for recheck of her anxiety and depression. She says she has been struggling some with anxiety as recent as her mother just passed away but she feels like she is doing better over the past couple months. Her mother passed away 2 months ago in 03-May-2022. She is happy with the Celexa and wants to stick with that and does not want to change or doing anything else at this point. She denies any suicidal ideations.  Depression screen [facility] 2/9 07/01/2016 09/20/2015 02/21/2015 01/09/2015 12/04/2014  Decreased Interest 0 _1 Down, Depressed, Hopeless _2 PHQ - 2 Score _3 Altered sleeping - _4 Tired, decreased energy - _5 Change in appetite - _6 Feeling bad or failure about yourself  - 0 1 0 0  Trouble concentrating - _7 Moving slowly or fidgety/restless - 0 _8 Suicidal thoughts - 0 0 0 0  PHQ-9 Score - _9 Difficult doing work/chores - Very difficult Somewhat difficult Somewhat difficult -  Some recent data might be hidden     GERD recheck Patient was doing a lot better with her GERD until she ran out of her medication. She has been having a lot of issues since she's been out of her  medication. She denies any blood in her stool.  Relevant past medical, surgical, family and social history reviewed and updated as indicated. Interim medical history since our last visit reviewed. Allergies and medications reviewed and updated.  Review of Systems  Per HPI unless specifically indicated above        Objective:    BP 111/73   Pulse 85   Temp 97.9 F (36.6 C) (Oral)   Ht _10  (1.6 m)   Wt 169 lb 6 oz (76.8 kg)   LMP 04/14/2012   BMI 30.00 kg/m   Wt Readings from Last 3 Encounters:  07/01/16 169 lb 6 oz (76.8 kg)  01/15/16 165 lb (74.8 kg)  09/20/15 169 lb 8 oz (76.9 kg)    Physical Exam  Results for orders placed or performed during the hospital encounter of 01/15/16  Urinalysis, Routine w reflex microscopic- may I&O cath if menses  Result Value Ref Range   Color, Urine YELLOW YELLOW   APPearance CLEAR CLEAR   Specific Gravity, Urine 1.013 1.005 - 1.030   pH 5.0 5.0 - 8.0   Glucose, UA NEGATIVE NEGATIVE mg/dL   Hgb urine dipstick NEGATIVE NEGATIVE   Bilirubin Urine NEGATIVE NEGATIVE   Ketones, ur NEGATIVE NEGATIVE mg/dL  Protein, ur NEGATIVE NEGATIVE mg/dL   Nitrite NEGATIVE NEGATIVE   Leukocytes, UA NEGATIVE NEGATIVE      Assessment & Plan:   Problem List Items Addressed This Visit      Cardiovascular and Mediastinum   Essential hypertension, benign - Primary   Relevant Medications   hydrochlorothiazide (MICROZIDE) 12.5 MG capsule   Other Relevant Orders   CMP14+EGFR   Lipid panel     Digestive   GERD (gastroesophageal reflux disease)   Relevant Medications   dexlansoprazole (DEXILANT) 60 MG capsule   Other Relevant Orders   CBC with Differential/Platelet     Other   Anxiety and depression   Relevant Medications   citalopram (CELEXA) 40 MG tablet    Other Visit Diagnoses    Lipid screening       Relevant Orders   Lipid panel       Follow up plan: Return in about 6 months (around 12/31/2016), or if symptoms worsen or fail  to improve, for Patient needs Pap since she wants to, follow-up in 6 months for anxiety and depression and hypertens.  Counseling provided for all of the vaccine components Orders Placed This Encounter  Procedures  . CBC with Differential/Platelet  . CMP14+EGFR  . Lipid panel    Caryl Pina, MD [facility] 07/01/2016, 4:27 PM

## 2016-07-02 LAB — CBC WITH DIFFERENTIAL/PLATELET
BASOS ABS: 0 10*3/uL (ref 0.0–0.2)
Basos: 1 %
EOS (ABSOLUTE): 0.2 10*3/uL (ref 0.0–0.4)
Eos: 3 %
HEMOGLOBIN: 13.2 g/dL (ref 11.1–15.9)
Hematocrit: 39.9 % (ref 34.0–46.6)
Immature Grans (Abs): 0 10*3/uL (ref 0.0–0.1)
Immature Granulocytes: 0 %
LYMPHS ABS: 3.1 10*3/uL (ref 0.7–3.1)
Lymphs: 37 %
MCH: 28.2 pg (ref 26.6–33.0)
MCHC: 33.1 g/dL (ref 31.5–35.7)
MCV: 85 fL (ref 79–97)
MONOCYTES: 12 %
MONOS ABS: 1 10*3/uL — AB (ref 0.1–0.9)
Neutrophils Absolute: 4 10*3/uL (ref 1.4–7.0)
Neutrophils: 47 %
Platelets: 343 10*3/uL (ref 150–379)
RBC: 4.68 x10E6/uL (ref 3.77–5.28)
RDW: 14 % (ref 12.3–15.4)
WBC: 8.4 10*3/uL (ref 3.4–10.8)

## 2016-07-02 LAB — CMP14+EGFR
ALBUMIN: 4.4 g/dL (ref 3.5–5.5)
ALK PHOS: 102 IU/L (ref 39–117)
ALT: 12 IU/L (ref 0–32)
AST: 21 IU/L (ref 0–40)
Albumin/Globulin Ratio: 1.8 (ref 1.2–2.2)
BILIRUBIN TOTAL: 0.3 mg/dL (ref 0.0–1.2)
BUN / CREAT RATIO: 18 (ref 9–23)
BUN: 17 mg/dL (ref 6–24)
CO2: 22 mmol/L (ref 20–29)
Calcium: 9.6 mg/dL (ref 8.7–10.2)
Chloride: 103 mmol/L (ref 96–106)
Creatinine, Ser: 0.97 mg/dL (ref 0.57–1.00)
GFR calc Af Amer: 76 mL/min/{1.73_m2} (ref >59)
GFR calc non Af Amer: 66 mL/min/{1.73_m2} (ref >59)
GLUCOSE: 85 mg/dL (ref 65–99)
Globulin, Total: 2.5 g/dL (ref 1.5–4.5)
Potassium: 4 mmol/L (ref 3.5–5.2)
SODIUM: 141 mmol/L (ref 134–144)
Total Protein: 6.9 g/dL (ref 6.0–8.5)

## 2016-07-02 LAB — LIPID PANEL
CHOLESTEROL TOTAL: 188 mg/dL (ref 100–199)
Chol/HDL Ratio: 3.4 ratio (ref 0.0–4.4)
HDL: 55 mg/dL (ref >39)
LDL Calculated: 101 mg/dL — ABNORMAL HIGH (ref 0–99)
TRIGLYCERIDES: 161 mg/dL — AB (ref 0–149)
VLDL Cholesterol Cal: 32 mg/dL (ref 5–40)

## 2016-07-20 ENCOUNTER — Ambulatory Visit (INDEPENDENT_AMBULATORY_CARE_PROVIDER_SITE_OTHER): Payer: Medicare HMO | Admitting: Family Medicine

## 2016-07-20 ENCOUNTER — Encounter: Payer: Self-pay | Admitting: Family Medicine

## 2016-07-20 VITALS — BP 118/79 | HR 63 | Temp 97.4°F | Ht 63.0 in | Wt 170.0 lb

## 2016-07-20 DIAGNOSIS — Z01419 Encounter for gynecological examination (general) (routine) without abnormal findings: Secondary | ICD-10-CM | POA: Diagnosis not present

## 2016-07-20 DIAGNOSIS — R87610 Atypical squamous cells of undetermined significance on cytologic smear of cervix (ASC-US): Secondary | ICD-10-CM | POA: Diagnosis not present

## 2016-07-20 DIAGNOSIS — R87612 Low grade squamous intraepithelial lesion on cytologic smear of cervix (LGSIL): Secondary | ICD-10-CM | POA: Diagnosis not present

## 2016-07-20 NOTE — Progress Notes (Signed)
BP 118/79   Pulse 63   Temp (!) 97.4 F (36.3 C) (Oral)   Ht 5\' 3"  (1.6 m)   Wt 170 lb (77.1 kg)   LMP 04/14/2012   BMI 30.11 kg/m    Subjective:    Patient ID: Brianna [name], female    DOB: 1960-12-23, 56 y.o.   MRN: [id number]  HPI: Brianna [name] is a 56 y.o. female presenting on 07/20/2016 for Gynecologic Exam   HPI Well woman exam and Pap smear Patient is coming in today for well woman exam and Pap smear. She had her mammogram last year in March 2017 which was normal. She had a Pap smear last year which showed low-grade negative HPV and this is her repeat this year. She has not had any issues with her vagina such as drainage or irritation or pain. She has not had any abdominal issues of or pains either. She has not had periods in at least a couple years and is having some mood swings and hot flashes but they are starting to improve. She denies any breast drainage or skin changes or lumps or nodules. Patient denies any chest pain, shortness of breath, headaches or vision issues, abdominal complaints, diarrhea, nausea, vomiting, or joint issues.   Relevant past medical, surgical, family and social history reviewed and updated as indicated. Interim medical history since our last visit reviewed. Allergies and medications reviewed and updated.  Review of Systems  Constitutional: Negative for chills and fever.  HENT: Negative for ear pain and tinnitus.   Eyes: Negative for pain.  Respiratory: Negative for cough, shortness of breath and wheezing.   Cardiovascular: Negative for chest pain, palpitations and leg swelling.  Gastrointestinal: Negative for abdominal pain, blood in stool, constipation and diarrhea.  Genitourinary: Negative for dysuria and hematuria.  Musculoskeletal: Negative for back pain and myalgias.  Skin: Negative for rash.  Neurological: Negative for dizziness, weakness and headaches.  Psychiatric/Behavioral: Negative for suicidal ideas.    Per HPI  unless specifically indicated above      Objective:    BP 118/79   Pulse 63   Temp (!) 97.4 F (36.3 C) (Oral)   Ht 5\' 3"  (1.6 m)   Wt 170 lb (77.1 kg)   LMP 04/14/2012   BMI 30.11 kg/m   Wt Readings from Last 3 Encounters:  07/20/16 170 lb (77.1 kg)  07/01/16 169 lb 6 oz (76.8 kg)  01/15/16 165 lb (74.8 kg)    Physical Exam  Constitutional: She is oriented to person, place, and time. She appears well-developed and well-nourished. No distress.  Eyes: Conjunctivae are normal.  Neck: Neck supple. No thyromegaly present.  Cardiovascular: Normal rate, regular rhythm, normal heart sounds and intact distal pulses.   No murmur heard. Pulmonary/Chest: Effort normal and breath sounds normal. No respiratory distress. She has no wheezes. Right breast exhibits no inverted nipple, no mass, no nipple discharge, no skin change and no tenderness. Left breast exhibits no inverted nipple, no mass, no nipple discharge, no skin change and no tenderness. Breasts are symmetrical (Patient's right breast is slightly more pendulous but has been that way her whole life.).  Abdominal: Soft. Bowel sounds are normal. She exhibits no distension. There is no tenderness. There is no rebound and no guarding.  Genitourinary: Vagina normal and uterus normal. No breast swelling, tenderness, discharge or bleeding. Pelvic exam was performed with patient supine. There is no rash or lesion on the right labia. There is no rash or lesion on  the left labia. Uterus is not deviated, not enlarged, not fixed and not tender. Cervix exhibits no motion tenderness, no discharge and no friability. Right adnexum displays no mass and no tenderness. Left adnexum displays no mass and no tenderness.  Musculoskeletal: Normal range of motion. She exhibits no edema.  Lymphadenopathy:    She has no cervical adenopathy.    She has no axillary adenopathy.  Neurological: She is alert and oriented to person, place, and time. Coordination normal.    Skin: Skin is warm and dry. No rash noted. She is not diaphoretic.  Psychiatric: She has a normal mood and affect. Her behavior is normal.  Vitals reviewed.     Assessment & Plan:   Problem List Items Addressed This Visit    None    Visit Diagnoses    Well woman exam with routine gynecological exam    -  Primary   Relevant Orders   Pap IG, rfx HPV all pth   LGSIL on Pap smear of cervix       Relevant Orders   Pap IG, rfx HPV all pth      Follow up plan: Return if symptoms worsen or fail to improve.  Counseling provided for all of the vaccine components No orders of the defined types were placed in this encounter.   Caryl Pina, MD [facility] 07/20/2016, 10:34 AM

## 2016-07-24 LAB — PAP IG, RFX HPV ALL PTH: PAP Smear Comment: 0

## 2016-07-24 LAB — HPV DNA PROBE HIGH RISK, AMPLIFIED: HPV, high-risk: NEGATIVE

## 2016-09-16 DIAGNOSIS — Z9181 History of falling: Secondary | ICD-10-CM | POA: Diagnosis not present

## 2016-09-16 DIAGNOSIS — M797 Fibromyalgia: Secondary | ICD-10-CM | POA: Diagnosis not present

## 2016-09-16 DIAGNOSIS — J309 Allergic rhinitis, unspecified: Secondary | ICD-10-CM | POA: Diagnosis not present

## 2016-09-16 DIAGNOSIS — K219 Gastro-esophageal reflux disease without esophagitis: Secondary | ICD-10-CM | POA: Diagnosis not present

## 2016-09-16 DIAGNOSIS — Z0000 Encounter for general adult medical examination without abnormal findings: Secondary | ICD-10-CM | POA: Diagnosis not present

## 2016-09-16 DIAGNOSIS — Z6829 Body mass index (BMI) 29.0-29.9, adult: Secondary | ICD-10-CM | POA: Diagnosis not present

## 2016-09-16 DIAGNOSIS — R69 Illness, unspecified: Secondary | ICD-10-CM | POA: Diagnosis not present

## 2016-09-16 DIAGNOSIS — I10 Essential (primary) hypertension: Secondary | ICD-10-CM | POA: Diagnosis not present

## 2016-09-16 DIAGNOSIS — Z79899 Other long term (current) drug therapy: Secondary | ICD-10-CM | POA: Diagnosis not present

## 2016-10-15 IMAGING — DX DG SHOULDER 2+V*R*
3 series · 3 of 3 positions shown · non-contrast
Comparison: None

CLINICAL DATA: RIGHT shoulder pain for 1 year worse over past 2
days, chest pain, no injury

EXAM:
RIGHT SHOULDER - 2+ VIEW

[shoulder grashey]
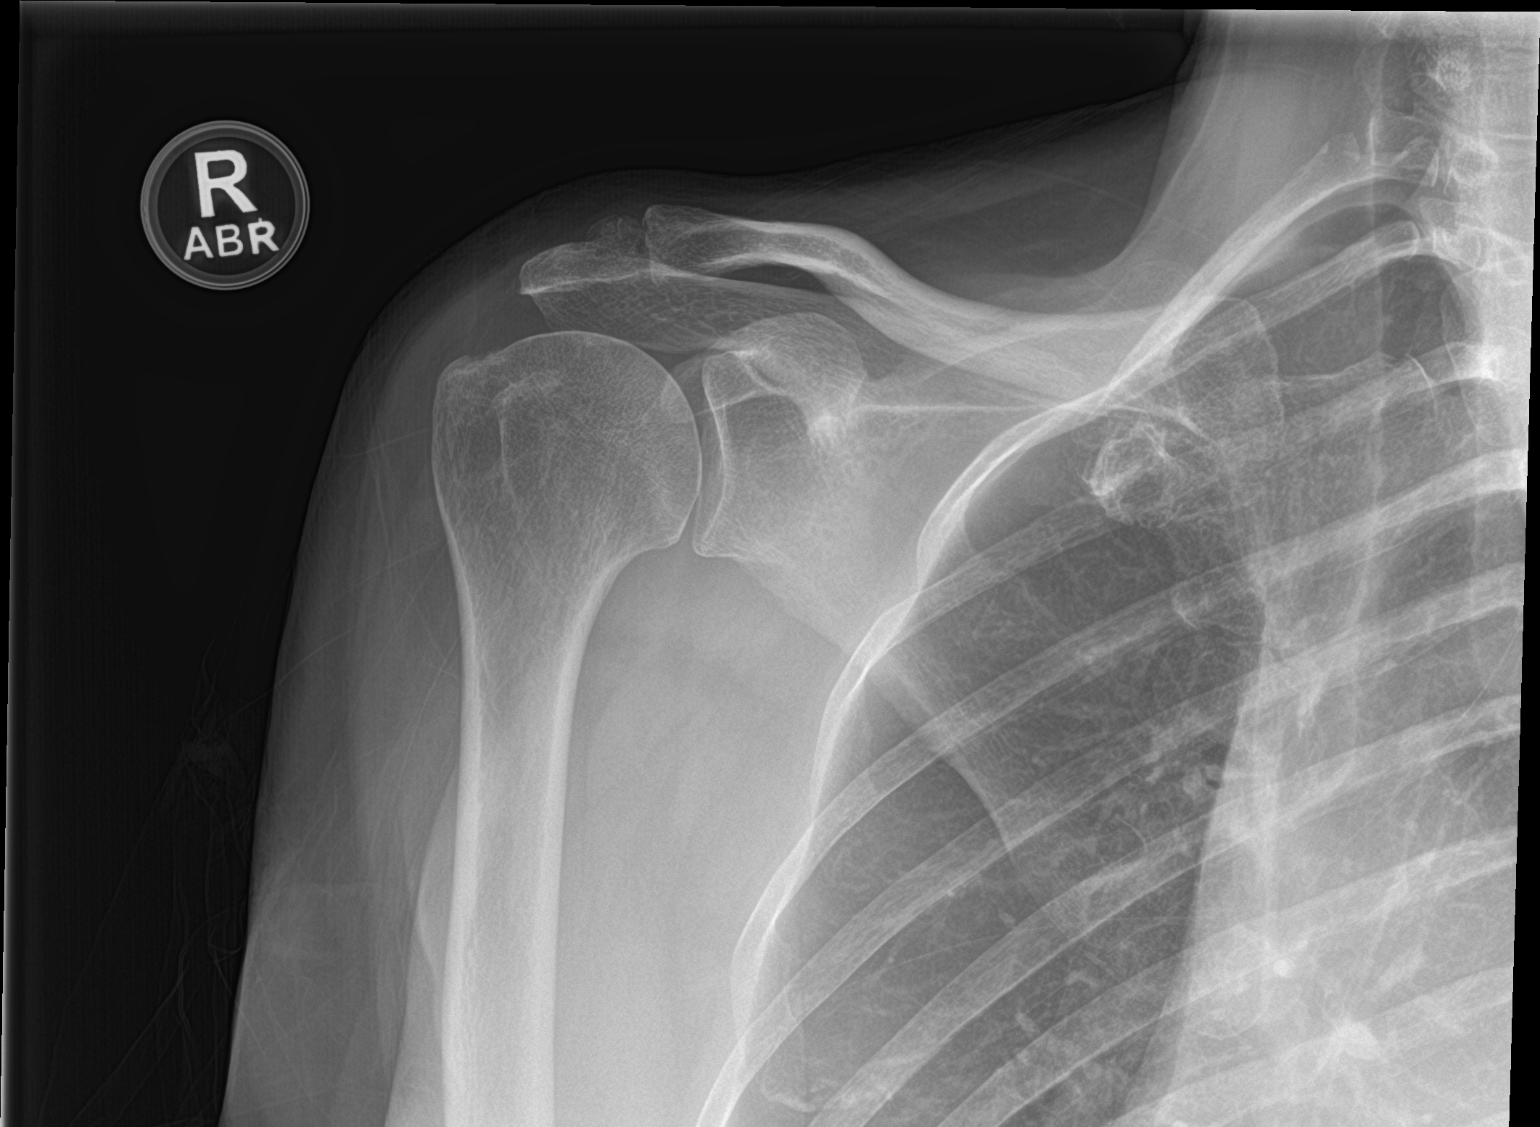

[shoulder y view]
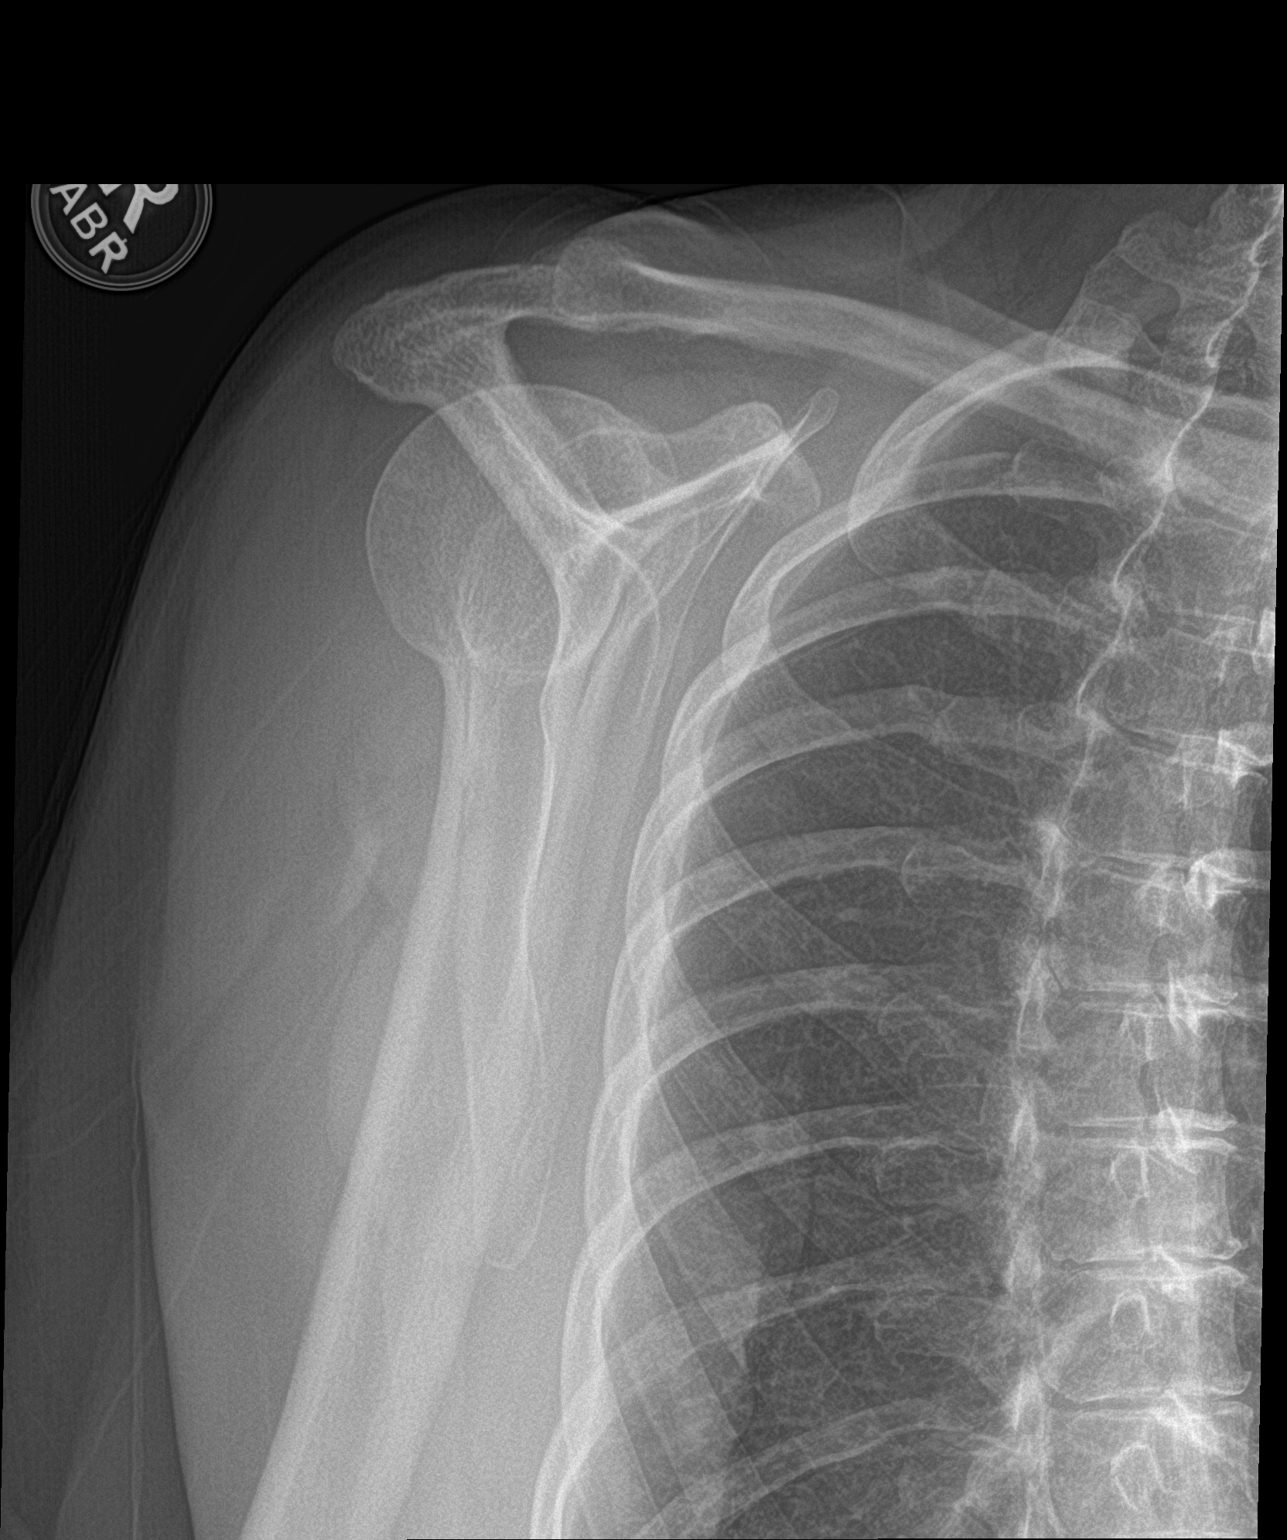

[shoulder axillary]
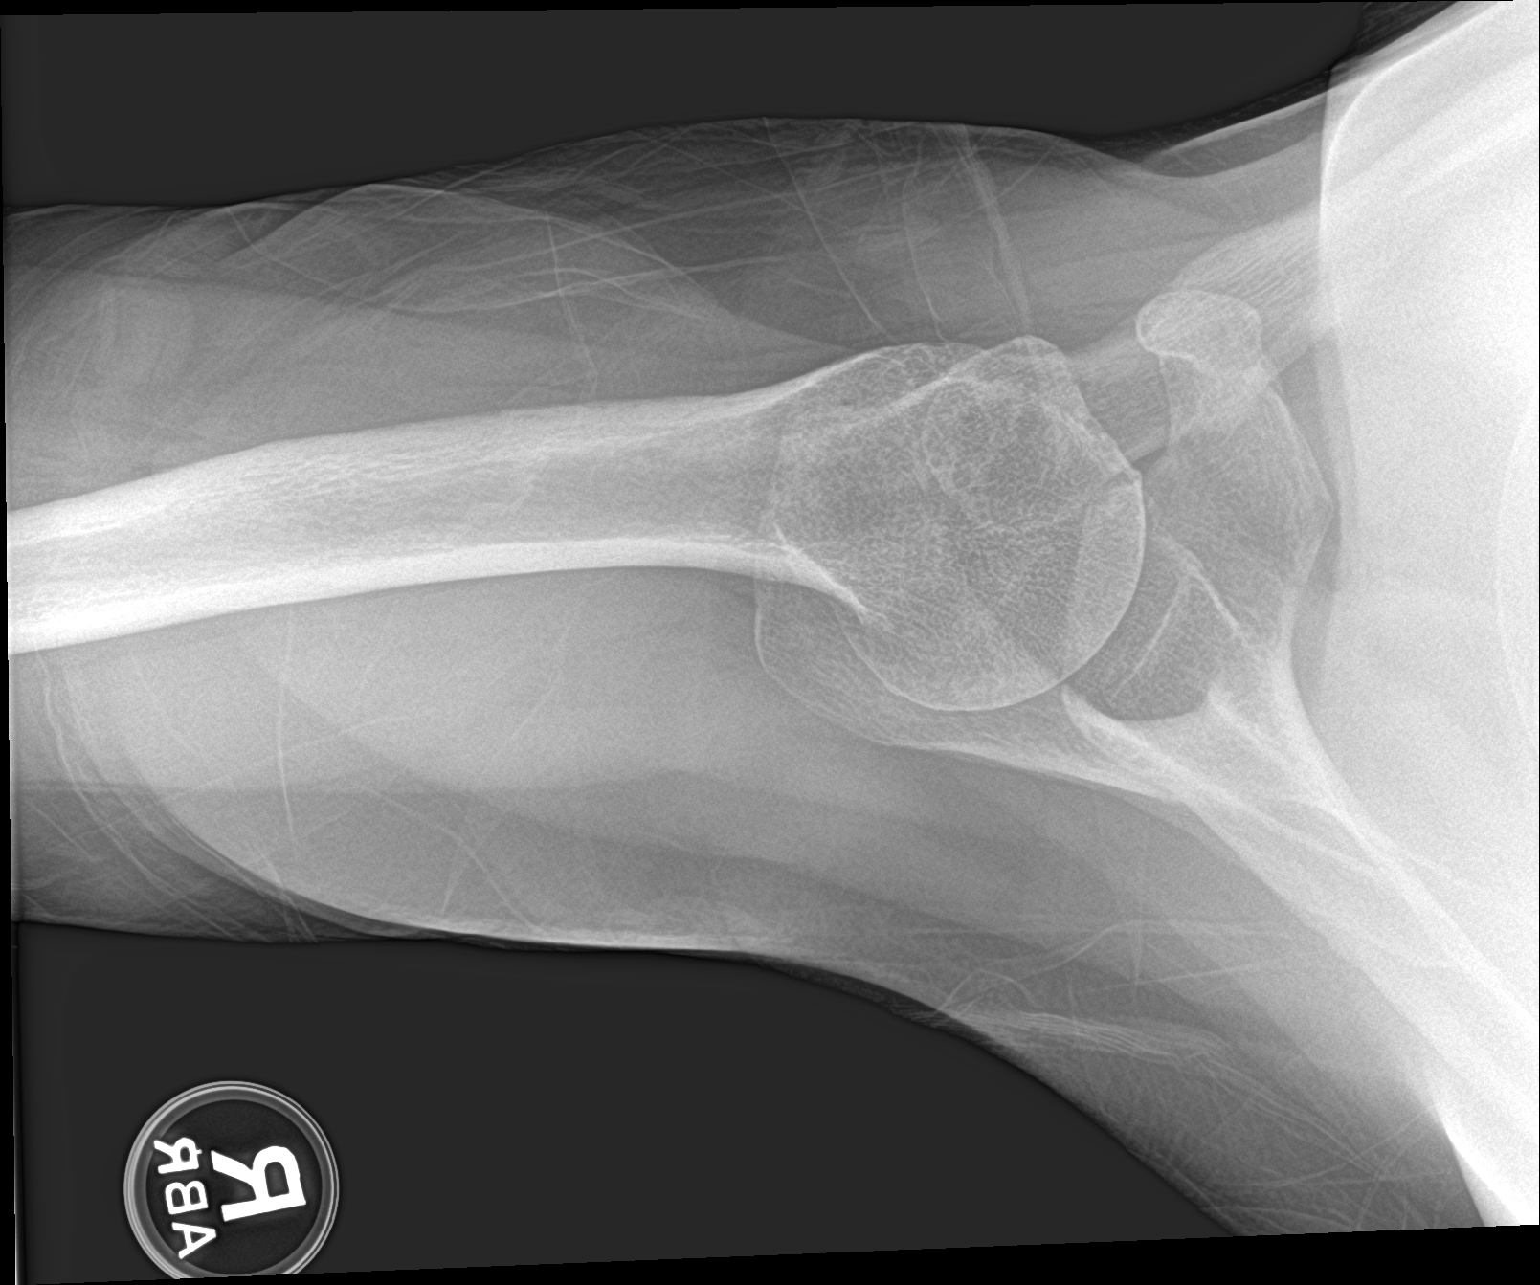

[3 of 3 positions shown; findings below may reference images not displayed]

FINDINGS: Bones demineralized.

AC joint alignment normal.

No acute fracture, dislocation, or bone destruction.

Visualized RIGHT ribs intact.
IMPRESSION: No acute osseous abnormalities.

## 2016-10-15 IMAGING — DX DG CHEST 2V
2 series · 2 of 2 positions shown · non-contrast
Comparison: 07/27/2013

CLINICAL DATA: Chest heaviness, nausea, and headache for 2 hours,
history hypertension, former smoker

EXAM:
CHEST  2 VIEW

[chest pa]
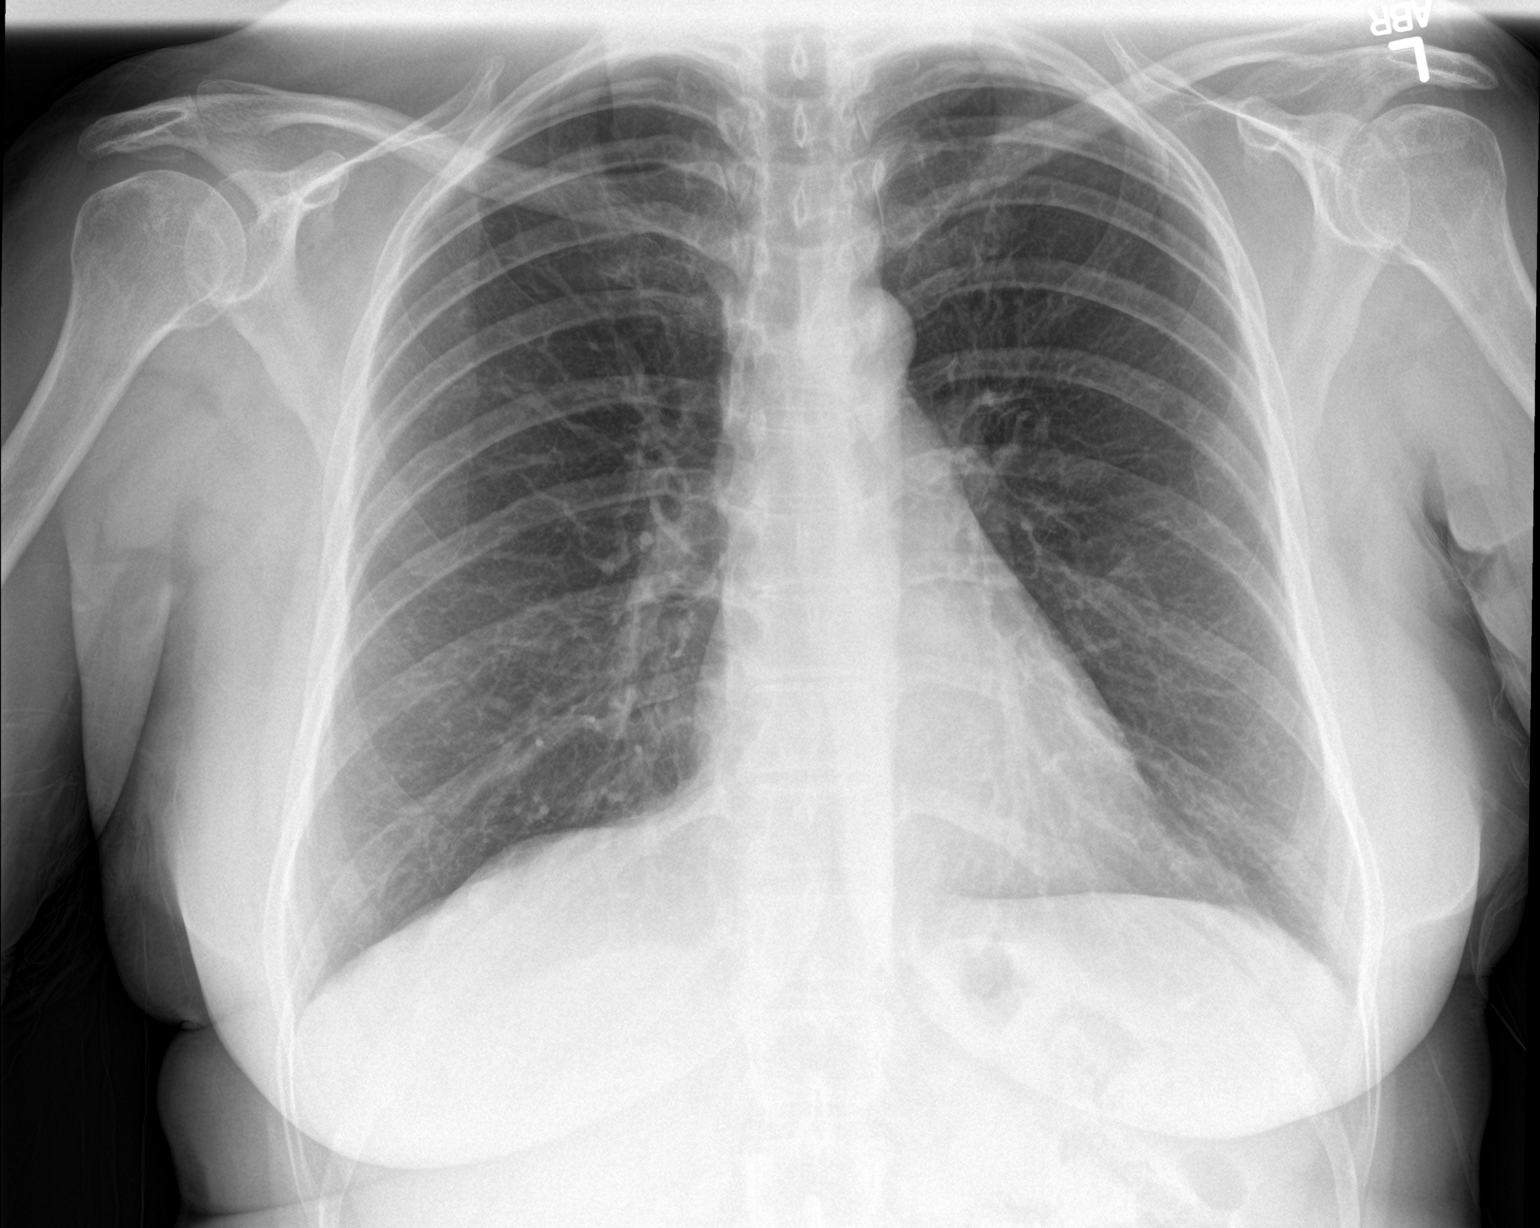

[chest lat]
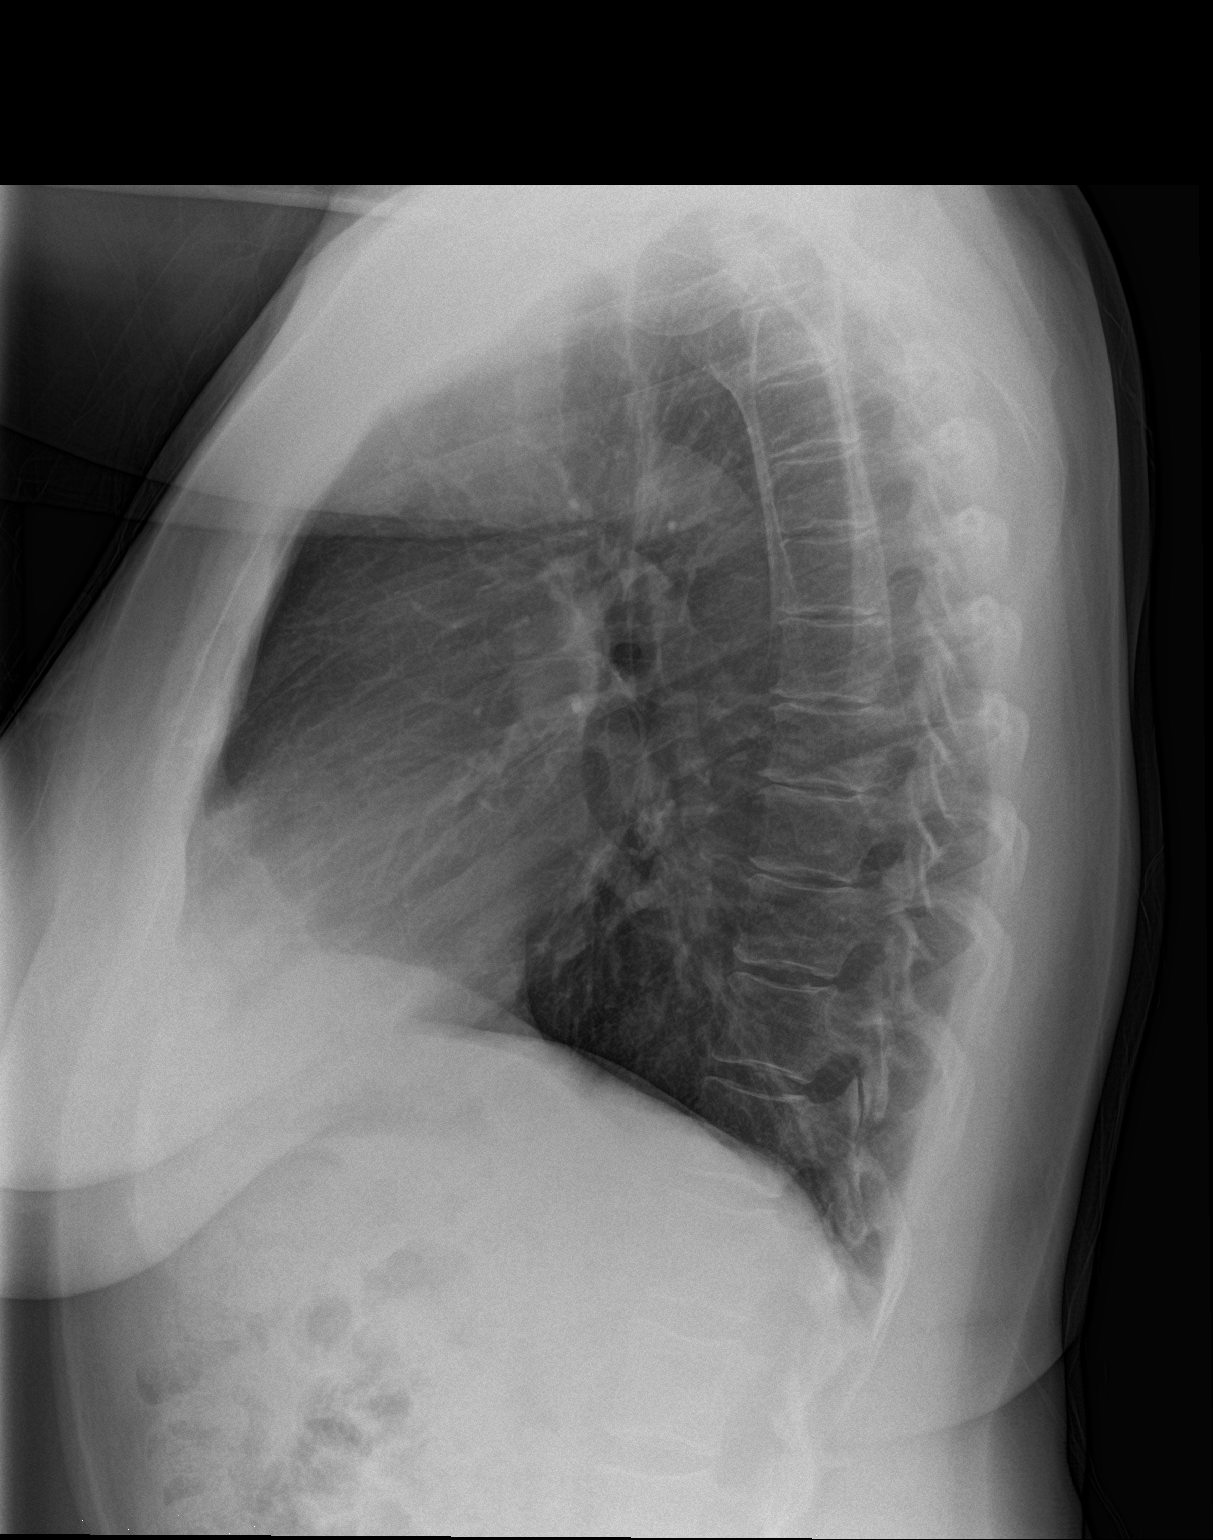

[2 of 2 positions shown; findings below may reference images not displayed]

FINDINGS: Normal heart size, mediastinal contours, and pulmonary vascularity.

Lungs clear.

No pleural effusion or pneumothorax.

Bones unremarkable.
IMPRESSION: No acute abnormalities.

## 2016-12-20 ENCOUNTER — Encounter (HOSPITAL_COMMUNITY): Payer: Self-pay | Admitting: *Deleted

## 2016-12-20 ENCOUNTER — Other Ambulatory Visit: Payer: Self-pay

## 2016-12-20 ENCOUNTER — Emergency Department (HOSPITAL_COMMUNITY): Payer: Medicare HMO

## 2016-12-20 ENCOUNTER — Emergency Department (HOSPITAL_COMMUNITY)
Admission: EM | Admit: 2016-12-20 | Discharge: 2016-12-21 | Disposition: A | Discharge status: Home / Self Care | Payer: Medicare HMO | Attending: Emergency Medicine | Admitting: Emergency Medicine

## 2016-12-20 DIAGNOSIS — Z79899 Other long term (current) drug therapy: Secondary | ICD-10-CM | POA: Diagnosis not present

## 2016-12-20 DIAGNOSIS — M791 Myalgia, unspecified site: Secondary | ICD-10-CM | POA: Insufficient documentation

## 2016-12-20 DIAGNOSIS — Z87891 Personal history of nicotine dependence: Secondary | ICD-10-CM | POA: Diagnosis not present

## 2016-12-20 DIAGNOSIS — R059 Cough, unspecified: Secondary | ICD-10-CM

## 2016-12-20 DIAGNOSIS — R05 Cough: Secondary | ICD-10-CM | POA: Insufficient documentation

## 2016-12-20 DIAGNOSIS — I10 Essential (primary) hypertension: Secondary | ICD-10-CM | POA: Diagnosis not present

## 2016-12-20 MED ORDER — ALBUTEROL SULFATE HFA 108 (90 BASE) MCG/ACT IN AERS
2.0000 | INHALATION_SPRAY | Freq: Once | RESPIRATORY_TRACT | Status: AC
  Administered 2016-12-20: 2 via RESPIRATORY_TRACT
  Filled 2016-12-20: qty 6.7

## 2016-12-20 MED ORDER — HYDROCOD POLST-CPM POLST ER 10-8 MG/5ML PO SUER: 5.0000 mL | Freq: Every evening | ORAL | 0 refills | Status: DC | PRN

## 2016-12-20 NOTE — ED Triage Notes (Signed)
Pt c/o upper respiratory difficulty x 1 week with cough and headach; pt has non-productive cough

## 2016-12-20 NOTE — ED Provider Notes (Signed)
[facility] EMERGENCY DEPARTMENT Provider Note   CSN: [id number] Arrival date & time: 12/20/16  2126     History   Chief Complaint Chief Complaint  Patient presents with  . Generalized Body Aches    HPI Brianna [name] is a 56 y.o. female.  The history is provided by the patient.  Cough  This is a new problem. The current episode started more than 2 days ago. The problem occurs hourly. The problem has been gradually worsening. The cough is non-productive. There has been no fever. Associated symptoms include chills, sore throat, myalgias and shortness of breath.  Patient reports onset of cough, myalgias last week He also reports headache, and sinus congestion Reports mild shortness of breath No vomiting or diarrhea Reports sick contacts No other acute complaints at this time  Past Medical History:  Diagnosis Date  . Anemia   . Anxiety   . Arthritis   . Chronic back pain   . Chronic neck pain   . Chronic right hip pain since 09/2012  . DDD (degenerative disc disease), cervical   . Depression   . Diverticulitis   . Fibromyalgia   . GERD (gastroesophageal reflux disease)   . Headache   . Hypertension   . Pancreatitis     Patient Active Problem List   Diagnosis Date Noted  . Essential hypertension, benign 02/21/2015  . Insomnia secondary to depression with anxiety 02/21/2015  . Anxiety and depression 01/09/2015  . Cholelithiasis   . Elevated LFTs 08/05/2014  . GERD (gastroesophageal reflux disease) 08/05/2014  . Pancreatitis 08/05/2014  . Adhesive capsulitis of right shoulder 06/08/2014  . Cervical radiculopathy at C7 05/25/2014  . Lumbar disc disease with radiculopathy 02/08/2014  . Inj musc/tend the rotator cuff of right shoulder, init 10/19/2013  . Carpal tunnel syndrome of right wrist 10/19/2013  . Abdominal pain, chronic, epigastric 08/11/2013    Past Surgical History:  Procedure Laterality Date  . CESAREAN SECTION    . CHOLECYSTECTOMY N/A  08/16/2014   Procedure: LAPAROSCOPIC CHOLECYSTECTOMY WITH CHOLANGIOGRAM;  Surgeon: Sherri Rad, MD;  Location: [facility] ORS;  Service: General;  Laterality: N/A;  . COLONOSCOPY N/A 08/22/2013   FTD:DUKGURK diverticulosis paper hemtochezia from anorectal irritation  . ESOPHAGOGASTRODUODENOSCOPY N/A 08/22/2013   YHC:WCBJSEG reflux esophagitis/noncritical schatzki's ring and hiatial hernia  . OOPHORECTOMY      OB History    Gravida Para Term Preterm AB Living   4 4 4     4    SAB TAB Ectopic Multiple Live Births                   Home Medications    Prior to Admission medications   Medication Sig Start Date End Date Taking? Authorizing Provider  citalopram (CELEXA) 40 MG tablet Take 1 tablet (40 mg total) by mouth daily. 07/01/16   Dettinger, Fransisca Kaufmann, MD  dexlansoprazole (DEXILANT) 60 MG capsule Take 1 capsule (60 mg total) by mouth daily. 07/01/16   Dettinger, Fransisca Kaufmann, MD  fluticasone (FLONASE) 50 MCG/ACT nasal spray Place 1 spray into both nostrils 2 (two) times daily as needed for allergies or rhinitis. 07/01/16   Dettinger, Fransisca Kaufmann, MD  hydrochlorothiazide (MICROZIDE) 12.5 MG capsule Take 1 capsule (12.5 mg total) by mouth daily. 07/01/16   Dettinger, Fransisca Kaufmann, MD  hydrOXYzine (ATARAX/VISTARIL) 50 MG tablet Take 1 tablet (50 mg total) by mouth 3 (three) times daily as needed. 09/20/15   Dettinger, Fransisca Kaufmann, MD  loratadine (CLARITIN) 10 MG tablet Take 1  tablet (10 mg total) by mouth daily. 07/01/16   Dettinger, Fransisca Kaufmann, MD  lubiprostone (AMITIZA) 24 MCG capsule Take 1 capsule (24 mcg total) by mouth 2 (two) times daily as needed for constipation. 01/09/15   Dettinger, Fransisca Kaufmann, MD  methocarbamol (ROBAXIN) 500 MG tablet Take 1 tablet (500 mg total) by mouth 2 (two) times daily. 01/15/16   Waynetta Pean, PA-C  pregabalin (LYRICA) 100 MG capsule Take 100 mg by mouth. One in the morning, 2 at bedtime    [provider]    Family History Family History  Problem Relation Age of Onset  .  Ovarian cancer Mother   . Gallbladder disease Son   . Colon cancer Neg Hx     Social History Social History   Tobacco Use  . Smoking status: Former Smoker    Packs/day: 0.50    Years: 2.00    Pack years: 1.00    Types: Cigarettes    Last attempt to quit: 01/06/2007    Years since quitting: 9.9  . Smokeless tobacco: Never Used  Substance Use Topics  . Alcohol use: No  . Drug use: No     Allergies   Tylenol with codeine #3 [acetaminophen-codeine]   Review of Systems Review of Systems  Constitutional: Positive for chills and fatigue.  HENT: Positive for sore throat.   Respiratory: Positive for cough and shortness of breath.   Gastrointestinal: Negative for vomiting.  Musculoskeletal: Positive for myalgias.  All other systems reviewed and are negative.    Physical Exam Updated Vital Signs BP 123/78 (BP Location: Right Arm)   Pulse 80   Temp 98.7 F (37.1 C) (Oral)   Resp 20   Ht 1.6 m (5\' 3" )   Wt 74.8 kg (165 lb)   LMP 04/14/2012   SpO2 96%   BMI 29.23 kg/m   Physical Exam CONSTITUTIONAL: Well developed/well nourished HEAD: Normocephalic/atraumatic EYES: EOMI/PERRL ENMT: Mucous membranes moist, uvula midline no erythema or exudates noted NECK: supple no meningeal signs SPINE/BACK:entire spine nontender CV: S1/S2 noted, no murmurs/rubs/gallops noted LUNGS: Lungs are clear to auscultation bilaterally, no apparent distress ABDOMEN: soft, nontender, no rebound or guarding, bowel sounds noted throughout abdomen GU:no cva tenderness NEURO: Pt is awake/alert/appropriate, moves all extremitiesx4.  No facial droop.   EXTREMITIES: pulses normal/equal, full ROM SKIN: warm, color normal PSYCH: no abnormalities of mood noted, alert and oriented to situation   ED Treatments / Results  Labs (all labs ordered are listed, but only abnormal results are displayed) Labs Reviewed - No data to display  EKG  EKG Interpretation None       Radiology Dg Chest 2  View  Result Date: 12/20/2016 CLINICAL DATA:  Cough and upper respiratory difficulty. EXAM: CHEST  2 VIEW COMPARISON:  May 24, 2014 FINDINGS: There is slight left base atelectasis. There is no edema or consolidation. Heart size and pulmonary vascular normal. No adenopathy. There is slight degenerative change in the thoracic spine. IMPRESSION: Slight left base atelectasis. No edema or consolidation. Stable cardiac silhouette. Electronically Signed   By: Lowella Grip III M.D.   On: 12/20/2016 23:02    Procedures Procedures (including critical care time)  Medications Ordered in ED Medications  albuterol (PROVENTIL HFA;VENTOLIN HFA) 108 (90 Base) MCG/ACT inhaler 2 puff (2 puffs Inhalation Given 12/20/16 2334)     Initial Impression / Assessment and Plan / ED Course  I have reviewed the triage vital signs and the nursing notes.  Pertinent imaging results that were available during  my care of the patient were reviewed by me and considered in my medical decision making (see chart for details).     Patient awake alert, afebrile, no distress, no hypoxia noted Suspect viral illness, I reviewed chest x-ray no convincing signs of pneumonia  Will give meds for symptomatic relief She says she can take hydrocodone which we will give her cough Defer antibiotics at this time We discussed strict return precautions  Final Clinical Impressions(s) / ED Diagnoses   Final diagnoses:  Cough  Myalgia    ED Discharge Orders        Ordered    chlorpheniramine-HYDROcodone (TUSSIONEX PENNKINETIC ER) 10-8 MG/5ML SUER  At bedtime PRN     12/20/16 2351       Ripley Fraise, MD 12/20/16 2352

## 2016-12-20 NOTE — ED Notes (Signed)
Pt to xray at this time.

## 2017-01-06 ENCOUNTER — Ambulatory Visit: Payer: Medicare HMO | Admitting: Family Medicine

## 2017-01-06 ENCOUNTER — Encounter: Payer: Medicare HMO | Admitting: Family Medicine

## 2017-01-13 ENCOUNTER — Encounter: Payer: Self-pay | Admitting: Family Medicine

## 2017-05-09 DIAGNOSIS — W57XXXA Bitten or stung by nonvenomous insect and other nonvenomous arthropods, initial encounter: Secondary | ICD-10-CM | POA: Diagnosis not present

## 2017-05-09 DIAGNOSIS — T7840XA Allergy, unspecified, initial encounter: Secondary | ICD-10-CM | POA: Diagnosis not present

## 2017-05-09 DIAGNOSIS — S0086XA Insect bite (nonvenomous) of other part of head, initial encounter: Secondary | ICD-10-CM | POA: Diagnosis not present

## 2017-05-09 DIAGNOSIS — S50361A Insect bite (nonvenomous) of right elbow, initial encounter: Secondary | ICD-10-CM | POA: Diagnosis not present

## 2017-05-09 DIAGNOSIS — I10 Essential (primary) hypertension: Secondary | ICD-10-CM | POA: Diagnosis not present

## 2017-05-09 DIAGNOSIS — M1990 Unspecified osteoarthritis, unspecified site: Secondary | ICD-10-CM | POA: Diagnosis not present

## 2017-05-09 DIAGNOSIS — R69 Illness, unspecified: Secondary | ICD-10-CM | POA: Diagnosis not present

## 2017-05-09 DIAGNOSIS — Z79899 Other long term (current) drug therapy: Secondary | ICD-10-CM | POA: Diagnosis not present

## 2017-07-15 ENCOUNTER — Other Ambulatory Visit: Payer: Self-pay | Admitting: Family Medicine

## 2017-07-15 DIAGNOSIS — F419 Anxiety disorder, unspecified: Principal | ICD-10-CM

## 2017-07-15 DIAGNOSIS — K219 Gastro-esophageal reflux disease without esophagitis: Secondary | ICD-10-CM

## 2017-07-15 DIAGNOSIS — I10 Essential (primary) hypertension: Secondary | ICD-10-CM

## 2017-07-15 DIAGNOSIS — F329 Major depressive disorder, single episode, unspecified: Secondary | ICD-10-CM

## 2017-07-15 DIAGNOSIS — F32A Depression, unspecified: Secondary | ICD-10-CM

## 2017-07-19 ENCOUNTER — Other Ambulatory Visit: Payer: Self-pay | Admitting: Family Medicine

## 2017-07-19 DIAGNOSIS — K219 Gastro-esophageal reflux disease without esophagitis: Secondary | ICD-10-CM

## 2017-07-19 MED ORDER — DEXLANSOPRAZOLE 60 MG PO CPDR
1.0000 | DELAYED_RELEASE_CAPSULE | Freq: Every day | ORAL | 0 refills | Status: DC
Start: 2017-07-19 — End: 2017-08-02

## 2017-07-19 NOTE — Telephone Encounter (Signed)
Pt aware refill sent into pharmacy 

## 2017-07-23 ENCOUNTER — Ambulatory Visit: Payer: Medicare HMO | Admitting: Family Medicine

## 2017-08-02 ENCOUNTER — Encounter: Payer: Self-pay | Admitting: Family Medicine

## 2017-08-02 ENCOUNTER — Ambulatory Visit (INDEPENDENT_AMBULATORY_CARE_PROVIDER_SITE_OTHER): Payer: Medicare HMO | Admitting: Family Medicine

## 2017-08-02 VITALS — BP 132/74 | HR 78 | Temp 97.4°F | Ht 63.0 in | Wt 173.0 lb

## 2017-08-02 DIAGNOSIS — F32A Depression, unspecified: Secondary | ICD-10-CM

## 2017-08-02 DIAGNOSIS — I10 Essential (primary) hypertension: Secondary | ICD-10-CM

## 2017-08-02 DIAGNOSIS — Z23 Encounter for immunization: Secondary | ICD-10-CM

## 2017-08-02 DIAGNOSIS — F419 Anxiety disorder, unspecified: Secondary | ICD-10-CM | POA: Diagnosis not present

## 2017-08-02 DIAGNOSIS — M5116 Intervertebral disc disorders with radiculopathy, lumbar region: Secondary | ICD-10-CM

## 2017-08-02 DIAGNOSIS — K219 Gastro-esophageal reflux disease without esophagitis: Secondary | ICD-10-CM

## 2017-08-02 DIAGNOSIS — F329 Major depressive disorder, single episode, unspecified: Secondary | ICD-10-CM

## 2017-08-02 DIAGNOSIS — R69 Illness, unspecified: Secondary | ICD-10-CM | POA: Diagnosis not present

## 2017-08-02 DIAGNOSIS — M797 Fibromyalgia: Secondary | ICD-10-CM | POA: Diagnosis not present

## 2017-08-02 DIAGNOSIS — E669 Obesity, unspecified: Secondary | ICD-10-CM | POA: Diagnosis not present

## 2017-08-02 MED ORDER — HYDROCHLOROTHIAZIDE 12.5 MG PO CAPS: 12.5000 mg | ORAL_CAPSULE | Freq: Every day | ORAL | 3 refills | Status: DC

## 2017-08-02 MED ORDER — PREGABALIN 100 MG PO CAPS: 100.0000 mg | ORAL_CAPSULE | Freq: Every day | ORAL | 2 refills | Status: DC

## 2017-08-02 MED ORDER — CITALOPRAM HYDROBROMIDE 40 MG PO TABS: 40.0000 mg | ORAL_TABLET | Freq: Every day | ORAL | 3 refills | Status: DC

## 2017-08-02 MED ORDER — DEXLANSOPRAZOLE 60 MG PO CPDR: 1.0000 | DELAYED_RELEASE_CAPSULE | Freq: Every day | ORAL | 3 refills | Status: DC

## 2017-08-02 NOTE — Progress Notes (Signed)
 BP 132/74   Pulse 78   Temp (!) 97.4 F (36.3 C) (Oral)   Ht 5' 3" (1.6 m)   Wt 173 lb (78.5 kg)   LMP 04/14/2012   BMI 30.65 kg/m    Subjective:    Patient ID: Brianna [name], female    DOB: 11/29/1960, 57 y.o.   MRN: [id number]  HPI: Almer T Ridgely is a 57 y.o. female presenting on 08/02/2017 for Medical Management of Chronic Issues (discuss Lyrica) Medical Management of Chronic Issues (discuss Lyrica)   HPI Hypertension Patient is currently on hydrochlorothiazide, and their blood pressure today is 132/74. Patient denies any lightheadedness or dizziness. Patient denies headaches, blurred vision, chest pains, shortness of breath, or weakness. Denies any side effects from medication and is content with current medication.   GERD Patient is currently on Dexilant.  She denies any major symptoms or abdominal pain or belching or burping. She denies any blood in her stool or lightheadedness or dizziness.   Anxiety and depression a fibromyalgia and lumbar pain with radiculopathy Patient has anxiety and depression and fibromyalgia and some nerve pain from her lower back that she has been seeing a pain management specialist for.  She has been taking Robaxin which she does not feel like it helped and Celexa and Lyrica.  She has been out of the Lyrica for the past 2 weeks but she feels like the Lyrica was helping significantly and she is wondering if we could take over prescribing Lyrica for her.  Relevant past medical, surgical, family and social history reviewed and updated as indicated. Interim medical history since our last visit reviewed. Allergies and medications reviewed and updated.  Review of Systems  Constitutional: Negative for chills and fever.  Eyes: Negative for redness and visual disturbance.  Respiratory: Negative for chest tightness and shortness of breath.   Cardiovascular: Negative for chest pain and leg swelling.  Gastrointestinal: Negative for abdominal distention and abdominal pain.  Musculoskeletal: Positive  for back pain and myalgias. Negative for gait problem.  Skin: Negative for rash.  Neurological: Negative for light-headedness and headaches.  Psychiatric/Behavioral: Negative for agitation, behavioral problems, dysphoric mood, self-injury, sleep disturbance and suicidal ideas. The patient is not nervous/anxious.   All other systems reviewed and are negative.   Per HPI unless specifically indicated above   Allergies as of 08/02/2017      Reactions   Tylenol With Codeine #3 [acetaminophen-codeine] Other (See Comments)   headache      Medication List        Accurate as of 08/02/17 11:59 PM. Always use your most recent med list.          citalopram 40 MG tablet Commonly known as:  CELEXA Take 1 tablet (40 mg total) by mouth daily.   dexlansoprazole 60 MG capsule Commonly known as:  DEXILANT Take 1 capsule (60 mg total) by mouth daily.   fluticasone 50 MCG/ACT nasal spray Commonly known as:  FLONASE Place 1 spray into both nostrils 2 (two) times daily as needed for allergies or rhinitis.   hydrochlorothiazide 12.5 MG capsule Commonly known as:  MICROZIDE Take 1 capsule (12.5 mg total) by mouth daily.   hydrOXYzine 50 MG tablet Commonly known as:  ATARAX/VISTARIL Take 1 tablet (50 mg total) by mouth 3 (three) times daily as needed.   loratadine 10 MG tablet Commonly known as:  CLARITIN Take 1 tablet (10 mg total) by mouth daily.   lubiprostone 24 MCG capsule Commonly known as:  AMITIZA Take 1 capsule (24 mcg total) by   mouth 2 (two) times daily as needed for constipation.   methocarbamol 500 MG tablet Commonly known as:  ROBAXIN Take 1 tablet (500 mg total) by mouth 2 (two) times daily.   pregabalin 100 MG capsule Commonly known as:  LYRICA Take 1 capsule (100 mg total) by mouth daily.          Objective:    BP 132/74   Pulse 78   Temp (!) 97.4 F (36.3 C) (Oral)   Ht 5' 3" (1.6 m)   Wt 173 lb (78.5 kg)   LMP 04/14/2012   BMI 30.65 kg/m   Wt Readings  from Last 3 Encounters:  08/02/17 173 lb (78.5 kg)  12/20/16 165 lb (74.8 kg)  07/20/16 170 lb (77.1 kg)    Physical Exam  Constitutional: She is oriented to person, place, and time. She appears well-developed and well-nourished. No distress.  Eyes: Conjunctivae are normal.  Neck: Neck supple. No thyromegaly present.  Cardiovascular: Normal rate, regular rhythm, normal heart sounds and intact distal pulses.  No murmur heard. Pulmonary/Chest: Effort normal and breath sounds normal. No respiratory distress. She has no wheezes.  Musculoskeletal: Normal range of motion. She exhibits no edema or tenderness (No tenderness to palpation anywhere over her musculature.).  Lymphadenopathy:    She has no cervical adenopathy.  Neurological: She is alert and oriented to person, place, and time. Coordination normal.  Skin: Skin is warm and dry. No rash noted. She is not diaphoretic.  Psychiatric: She has a normal mood and affect. Her behavior is normal.  Nursing note and vitals reviewed.       Assessment & Plan:   Problem List Items Addressed This Visit      Cardiovascular and Mediastinum   Essential hypertension, benign - Primary   Relevant Medications   hydrochlorothiazide (MICROZIDE) 12.5 MG capsule   Other Relevant Orders   CMP14+EGFR (Completed)   Lipid panel (Completed)     Digestive   GERD (gastroesophageal reflux disease)   Relevant Medications   dexlansoprazole (DEXILANT) 60 MG capsule   Other Relevant Orders   CBC with Differential/Platelet (Completed)     Nervous and Auditory   Lumbar disc disease with radiculopathy   Relevant Medications   citalopram (CELEXA) 40 MG tablet   pregabalin (LYRICA) 100 MG capsule     Other   Anxiety and depression   Relevant Medications   citalopram (CELEXA) 40 MG tablet   Other Relevant Orders   CBC with Differential/Platelet (Completed)   Obesity (BMI 30.0-34.9)   Relevant Orders   Lipid panel (Completed)    Other Visit Diagnoses      Fibromyalgia           Follow up plan: Return in about 3 months (around 11/02/2017), or if symptoms worsen or fail to improve, for Fibromyalgia hypertension recheck.  Counseling provided for all of the vaccine components Orders Placed This Encounter  Procedures  . Tdap vaccine greater than or equal to 7yo IM  . CBC with Differential/Platelet  . CMP14+EGFR  . Lipid panel     , MD [facility] 08/06/2017, 3:55 PM     

## 2017-08-03 LAB — CBC WITH DIFFERENTIAL/PLATELET
BASOS ABS: 0 10*3/uL (ref 0.0–0.2)
Basos: 0 %
EOS (ABSOLUTE): 0.2 10*3/uL (ref 0.0–0.4)
Eos: 3 %
Hematocrit: 38.1 % (ref 34.0–46.6)
Hemoglobin: 13 g/dL (ref 11.1–15.9)
IMMATURE GRANS (ABS): 0 10*3/uL (ref 0.0–0.1)
IMMATURE GRANULOCYTES: 0 %
LYMPHS: 34 %
Lymphocytes Absolute: 2.4 10*3/uL (ref 0.7–3.1)
MCH: 29.2 pg (ref 26.6–33.0)
MCHC: 34.1 g/dL (ref 31.5–35.7)
MCV: 86 fL (ref 79–97)
Monocytes Absolute: 0.8 10*3/uL (ref 0.1–0.9)
Monocytes: 11 %
NEUTROS ABS: 3.6 10*3/uL (ref 1.4–7.0)
NEUTROS PCT: 52 %
PLATELETS: 339 10*3/uL (ref 150–450)
RBC: 4.45 x10E6/uL (ref 3.77–5.28)
RDW: 14 % (ref 12.3–15.4)
WBC: 7 10*3/uL (ref 3.4–10.8)

## 2017-08-03 LAB — CMP14+EGFR
ALT: 14 IU/L (ref 0–32)
AST: 16 IU/L (ref 0–40)
Albumin/Globulin Ratio: 1.7 (ref 1.2–2.2)
Albumin: 4 g/dL (ref 3.5–5.5)
Alkaline Phosphatase: 85 IU/L (ref 39–117)
BILIRUBIN TOTAL: 0.4 mg/dL (ref 0.0–1.2)
BUN/Creatinine Ratio: 25 — ABNORMAL HIGH (ref 9–23)
BUN: 20 mg/dL (ref 6–24)
CHLORIDE: 107 mmol/L — AB (ref 96–106)
CO2: 25 mmol/L (ref 20–29)
Calcium: 9.2 mg/dL (ref 8.7–10.2)
Creatinine, Ser: 0.8 mg/dL (ref 0.57–1.00)
GFR calc non Af Amer: 83 mL/min/{1.73_m2} (ref >59)
GFR, EST AFRICAN AMERICAN: 95 mL/min/{1.73_m2} (ref >59)
GLUCOSE: 79 mg/dL (ref 65–99)
Globulin, Total: 2.4 g/dL (ref 1.5–4.5)
Potassium: 4.4 mmol/L (ref 3.5–5.2)
Sodium: 143 mmol/L (ref 134–144)
TOTAL PROTEIN: 6.4 g/dL (ref 6.0–8.5)

## 2017-08-03 LAB — LIPID PANEL
CHOLESTEROL TOTAL: 178 mg/dL (ref 100–199)
Chol/HDL Ratio: 2.8 ratio (ref 0.0–4.4)
HDL: 63 mg/dL (ref >39)
LDL Calculated: 87 mg/dL (ref 0–99)
TRIGLYCERIDES: 138 mg/dL (ref 0–149)
VLDL CHOLESTEROL CAL: 28 mg/dL (ref 5–40)

## 2017-09-01 ENCOUNTER — Other Ambulatory Visit: Payer: Self-pay | Admitting: Family Medicine

## 2017-09-13 DIAGNOSIS — W57XXXA Bitten or stung by nonvenomous insect and other nonvenomous arthropods, initial encounter: Secondary | ICD-10-CM | POA: Diagnosis not present

## 2017-09-13 DIAGNOSIS — I10 Essential (primary) hypertension: Secondary | ICD-10-CM | POA: Diagnosis not present

## 2017-09-13 DIAGNOSIS — S80862A Insect bite (nonvenomous), left lower leg, initial encounter: Secondary | ICD-10-CM | POA: Diagnosis not present

## 2017-09-13 DIAGNOSIS — Z79899 Other long term (current) drug therapy: Secondary | ICD-10-CM | POA: Diagnosis not present

## 2017-09-13 DIAGNOSIS — S40861A Insect bite (nonvenomous) of right upper arm, initial encounter: Secondary | ICD-10-CM | POA: Diagnosis not present

## 2017-09-13 DIAGNOSIS — S40862A Insect bite (nonvenomous) of left upper arm, initial encounter: Secondary | ICD-10-CM | POA: Diagnosis not present

## 2017-09-13 DIAGNOSIS — S1096XA Insect bite of unspecified part of neck, initial encounter: Secondary | ICD-10-CM | POA: Diagnosis not present

## 2017-09-13 DIAGNOSIS — S80861A Insect bite (nonvenomous), right lower leg, initial encounter: Secondary | ICD-10-CM | POA: Diagnosis not present

## 2017-11-02 DIAGNOSIS — M797 Fibromyalgia: Secondary | ICD-10-CM | POA: Diagnosis not present

## 2017-11-02 DIAGNOSIS — R69 Illness, unspecified: Secondary | ICD-10-CM | POA: Diagnosis not present

## 2017-11-02 DIAGNOSIS — F419 Anxiety disorder, unspecified: Secondary | ICD-10-CM | POA: Diagnosis not present

## 2017-11-02 DIAGNOSIS — X500XXA Overexertion from strenuous movement or load, initial encounter: Secondary | ICD-10-CM | POA: Diagnosis not present

## 2017-11-02 DIAGNOSIS — S39012A Strain of muscle, fascia and tendon of lower back, initial encounter: Secondary | ICD-10-CM | POA: Diagnosis not present

## 2017-11-02 DIAGNOSIS — X509XXA Other and unspecified overexertion or strenuous movements or postures, initial encounter: Secondary | ICD-10-CM | POA: Diagnosis not present

## 2017-11-02 DIAGNOSIS — M1990 Unspecified osteoarthritis, unspecified site: Secondary | ICD-10-CM | POA: Diagnosis not present

## 2017-11-02 DIAGNOSIS — I10 Essential (primary) hypertension: Secondary | ICD-10-CM | POA: Diagnosis not present

## 2017-11-02 DIAGNOSIS — Z79899 Other long term (current) drug therapy: Secondary | ICD-10-CM | POA: Diagnosis not present

## 2017-11-04 ENCOUNTER — Ambulatory Visit: Payer: Medicare HMO | Admitting: Family Medicine

## 2017-12-24 ENCOUNTER — Other Ambulatory Visit: Payer: Self-pay | Admitting: Family Medicine

## 2017-12-24 NOTE — Telephone Encounter (Signed)
Last seen 08/02/17

## 2018-03-01 ENCOUNTER — Telehealth: Payer: Self-pay | Admitting: Family Medicine

## 2018-03-14 ENCOUNTER — Other Ambulatory Visit: Payer: Self-pay | Admitting: Family Medicine

## 2018-03-15 NOTE — Telephone Encounter (Signed)
Patient needs an appointment for the refill, have her come in ASAP

## 2018-03-15 NOTE — Telephone Encounter (Signed)
Last seen 08/02/17

## 2018-03-16 NOTE — Telephone Encounter (Signed)
appt made

## 2018-03-21 ENCOUNTER — Other Ambulatory Visit: Payer: Self-pay

## 2018-03-21 ENCOUNTER — Encounter: Payer: Self-pay | Admitting: Family Medicine

## 2018-03-21 ENCOUNTER — Ambulatory Visit (INDEPENDENT_AMBULATORY_CARE_PROVIDER_SITE_OTHER): Payer: Medicare HMO | Admitting: Family Medicine

## 2018-03-21 VITALS — BP 113/78 | HR 76 | Temp 97.6°F | Ht 63.0 in | Wt 169.2 lb

## 2018-03-21 DIAGNOSIS — E669 Obesity, unspecified: Secondary | ICD-10-CM | POA: Diagnosis not present

## 2018-03-21 DIAGNOSIS — F32A Depression, unspecified: Secondary | ICD-10-CM

## 2018-03-21 DIAGNOSIS — F329 Major depressive disorder, single episode, unspecified: Secondary | ICD-10-CM | POA: Diagnosis not present

## 2018-03-21 DIAGNOSIS — I10 Essential (primary) hypertension: Secondary | ICD-10-CM

## 2018-03-21 DIAGNOSIS — F419 Anxiety disorder, unspecified: Secondary | ICD-10-CM | POA: Diagnosis not present

## 2018-03-21 DIAGNOSIS — K219 Gastro-esophageal reflux disease without esophagitis: Secondary | ICD-10-CM | POA: Diagnosis not present

## 2018-03-21 DIAGNOSIS — Z79891 Long term (current) use of opiate analgesic: Secondary | ICD-10-CM | POA: Diagnosis not present

## 2018-03-21 DIAGNOSIS — R69 Illness, unspecified: Secondary | ICD-10-CM | POA: Diagnosis not present

## 2018-03-21 MED ORDER — DEXLANSOPRAZOLE 60 MG PO CPDR: 1.0000 | DELAYED_RELEASE_CAPSULE | Freq: Every day | ORAL | 3 refills | Status: DC

## 2018-03-21 MED ORDER — PREGABALIN 100 MG PO CAPS: 100.0000 mg | ORAL_CAPSULE | Freq: Two times a day (BID) | ORAL | 5 refills | Status: DC

## 2018-03-21 MED ORDER — HYDROCHLOROTHIAZIDE 12.5 MG PO CAPS: 12.5000 mg | ORAL_CAPSULE | Freq: Every day | ORAL | 3 refills | Status: DC

## 2018-03-21 MED ORDER — CITALOPRAM HYDROBROMIDE 40 MG PO TABS: 40.0000 mg | ORAL_TABLET | Freq: Every day | ORAL | 3 refills | Status: DC

## 2018-03-21 NOTE — Progress Notes (Signed)
BP 113/78   Pulse 76   Temp 97.6 F (36.4 C) (Oral)   Ht 5\' 3"  (1.6 m)   Wt 169 lb 3.2 oz (76.7 kg)   LMP 04/14/2012   BMI 29.97 kg/m    Subjective:    Patient ID: Brianna [name], female    DOB: 01/11/1960, 58 y.o.   MRN: [id number]  HPI: Brianna [name] is a 59 y.o. female presenting on 03/21/2018 for Hypertension (check up of chronic medical conditions); Depression; Headache (x 3 days); and Sore Throat   HPI Hypertension Patient is currently on chlorothiazide, and their blood pressure today is 113/78. Patient denies any lightheadedness or dizziness. Patient denies headaches, blurred vision, chest pains, shortness of breath, or weakness. Denies any side effects from medication and is content with current medication.   GERD Patient is currently on [place].  She denies any major symptoms or abdominal pain or belching or burping. She denies any blood in her stool or lightheadedness or dizziness.   Anxiety and depression Patient is coming in for anxiety and depression recheck.  She is currently on Celexa and Lyrica and has been using hydroxyzine for this.  She denies any major suicidal ideations or thoughts of hurting her self and thinks that the medication is going well. Depression screen [facility] 2/9 03/21/2018 08/02/2017 07/20/2016 07/01/2016 09/20/2015  Decreased Interest 1 1 0 0 3  Down, Depressed, Hopeless 1 1 0 1 3  PHQ - 2 Score 2 2 0 1 6  Altered sleeping 1 3 - - 3  Tired, decreased energy 1 3 - - 3  Change in appetite 1 3 - - 3  Feeling bad or failure about yourself  0 0 - - 0  Trouble concentrating 0 0 - - 3  Moving slowly or fidgety/restless 0 3 - - 0  Suicidal thoughts 0 0 - - 0  PHQ-9 Score 5 14 - - 18  Difficult doing work/chores - - - - Very difficult  Some recent data might be hidden     Relevant past medical, surgical, family and social history reviewed and updated as indicated. Interim medical history since our last visit reviewed. Allergies and medications  reviewed and updated.  Review of Systems  Constitutional: Negative for chills and fever.  HENT: Negative for congestion, ear discharge and ear pain.   Eyes: Negative for visual disturbance.  Respiratory: Negative for chest tightness and shortness of breath.   Cardiovascular: Negative for chest pain and leg swelling.  Gastrointestinal: Negative for abdominal pain.  Musculoskeletal: Negative for back pain and gait problem.  Skin: Negative for rash.  Neurological: Negative for light-headedness and headaches.  Psychiatric/Behavioral: Positive for dysphoric mood. Negative for agitation, behavioral problems, self-injury, sleep disturbance and suicidal ideas. The patient is nervous/anxious.   All other systems reviewed and are negative.   Per HPI unless specifically indicated above   Allergies as of 03/21/2018      Reactions   Tylenol With Codeine #3 [acetaminophen-codeine] Other (See Comments)   headache      Medication List       Accurate as of March 21, 2018  9:24 AM. Always use your most recent med list.        Allergy 10 MG tablet Generic drug:  loratadine TAKE ONE TABLET BY MOUTH DAILY   citalopram 40 MG tablet Commonly known as:  CELEXA Take 1 tablet (40 mg total) by mouth daily.   dexlansoprazole 60 MG capsule Commonly known as:  Dexilant Take 1  capsule (60 mg total) by mouth daily.   fluticasone 50 MCG/ACT nasal spray Commonly known as:  FLONASE Place 1 spray into both nostrils 2 (two) times daily as needed for allergies or rhinitis.   hydrochlorothiazide 12.5 MG capsule Commonly known as:  MICROZIDE Take 1 capsule (12.5 mg total) by mouth daily.   lubiprostone 24 MCG capsule Commonly known as:  AMITIZA Take 1 capsule (24 mcg total) by mouth 2 (two) times daily as needed for constipation.   pregabalin 100 MG capsule Commonly known as:  LYRICA Take 1 capsule (100 mg total) by mouth 2 (two) times daily.          Objective:    BP 113/78   Pulse 76    Temp 97.6 F (36.4 C) (Oral)   Ht 5\' 3"  (1.6 m)   Wt 169 lb 3.2 oz (76.7 kg)   LMP 04/14/2012   BMI 29.97 kg/m   Wt Readings from Last 3 Encounters:  03/21/18 169 lb 3.2 oz (76.7 kg)  08/02/17 173 lb (78.5 kg)  12/20/16 165 lb (74.8 kg)    Physical Exam Vitals signs and nursing note reviewed.  Constitutional:      General: She is not in acute distress.    Appearance: She is well-developed. She is not diaphoretic.  Eyes:     Conjunctiva/sclera: Conjunctivae normal.     Pupils: Pupils are equal, round, and reactive to light.  Cardiovascular:     Rate and Rhythm: Normal rate and regular rhythm.     Heart sounds: Normal heart sounds. No murmur.  Pulmonary:     Effort: Pulmonary effort is normal. No respiratory distress.     Breath sounds: Normal breath sounds. No wheezing.  Musculoskeletal: Normal range of motion.        General: No tenderness.  Skin:    General: Skin is warm and dry.     Findings: No rash.  Neurological:     Mental Status: She is alert and oriented to person, place, and time.     Coordination: Coordination normal.  Psychiatric:        Mood and Affect: Mood is anxious and depressed.        Behavior: Behavior normal.        Thought Content: Thought content does not include suicidal ideation. Thought content does not include suicidal plan.         Assessment & Plan:   Problem List Items Addressed This Visit      Cardiovascular and Mediastinum   Essential hypertension, benign   Relevant Medications   hydrochlorothiazide (MICROZIDE) 12.5 MG capsule     Digestive   GERD (gastroesophageal reflux disease) - Primary   Relevant Medications   dexlansoprazole (DEXILANT) 60 MG capsule     Other   Anxiety and depression   Relevant Medications   citalopram (CELEXA) 40 MG tablet   pregabalin (LYRICA) 100 MG capsule   Other Relevant Orders   ToxASSURE Select 13 (MW), Urine (Completed)   Obesity (BMI 30.0-34.9)     Continue Lyrica and Celexa and Dexilant  and hydrochlorothiazide, will do tox assure and signed new drug screen contract  Follow up plan: Return in about 6 months (around 09/21/2018), or if symptoms worsen or fail to improve, for Anxiety and hypertension and labs.  Counseling provided for all of the vaccine components Orders Placed This Encounter  Procedures  . ToxASSURE Select 13 (MW), Urine    Caryl Pina, MD [place] 03/21/2018, 9:24 AM

## 2018-03-25 LAB — TOXASSURE SELECT 13 (MW), URINE

## 2018-04-21 ENCOUNTER — Ambulatory Visit (INDEPENDENT_AMBULATORY_CARE_PROVIDER_SITE_OTHER): Payer: Medicare HMO | Admitting: Family Medicine

## 2018-04-21 ENCOUNTER — Other Ambulatory Visit: Payer: Self-pay

## 2018-04-21 ENCOUNTER — Encounter: Payer: Self-pay | Admitting: Family Medicine

## 2018-04-21 VITALS — BP 117/78 | HR 72 | Temp 97.9°F | Ht 63.0 in | Wt 169.0 lb

## 2018-04-21 DIAGNOSIS — L739 Follicular disorder, unspecified: Secondary | ICD-10-CM

## 2018-04-21 MED ORDER — AMOXICILLIN 875 MG PO TABS: 875.0000 mg | ORAL_TABLET | Freq: Two times a day (BID) | ORAL | 0 refills | Status: AC

## 2018-04-21 MED ORDER — PREDNISONE 10 MG PO TABS: ORAL_TABLET | ORAL | 0 refills | Status: DC

## 2018-04-21 NOTE — Progress Notes (Signed)
Chief Complaint  Patient presents with  . Rash    HPI  Patient presents today for 3 weeks ofincreasing burning and itching of rash on back and chest. Has inspected her bed and found no bugs. Has used various OTC preps including triple antibiotic and cortizone 10.  PMH: Smoking status noted ROS: Per HPI  Objective: BP 117/78   Pulse 72   Temp 97.9 F (36.6 C) (Oral)   Ht 5\' 3"  (1.6 m)   Wt 169 lb (76.7 kg)   LMP 04/14/2012   BMI 29.94 kg/m  Gen: NAD, alert, cooperative with exam HEENT: NCAT, EOMI, PERRL CV: RRR, good S1/S2, no murmur Resp: CTABL, no wheezes, non-labored Skin: crusted papules 3-5 mm each spread over back at posterior shoulders. Few similar lesions at midline lumbwr and over upper chest. This crosses several dermatomes. Neuro: Alert and oriented, No gross deficits    Assessment and plan:  1. Folliculitis     Meds ordered this encounter  Medications  . amoxicillin (AMOXIL) 875 MG tablet    Sig: Take 1 tablet (875 mg total) by mouth 2 (two) times daily for 10 days.    Dispense:  20 tablet    Refill:  0  . predniSONE (DELTASONE) 10 MG tablet    Sig: Take 5 daily for 2 days followed by 4,3,2 and 1 for 2 days each.    Dispense:  30 tablet    Refill:  0      Follow up as needed.  Claretta Fraise, MD

## 2018-05-20 ENCOUNTER — Telehealth: Payer: Self-pay | Admitting: Family Medicine

## 2018-08-15 ENCOUNTER — Other Ambulatory Visit: Payer: Self-pay | Admitting: Family Medicine

## 2018-10-13 DIAGNOSIS — Z1231 Encounter for screening mammogram for malignant neoplasm of breast: Secondary | ICD-10-CM | POA: Diagnosis not present

## 2018-10-14 ENCOUNTER — Other Ambulatory Visit: Payer: Self-pay | Admitting: Family Medicine

## 2018-10-14 DIAGNOSIS — F32A Depression, unspecified: Secondary | ICD-10-CM

## 2018-10-14 DIAGNOSIS — F329 Major depressive disorder, single episode, unspecified: Secondary | ICD-10-CM

## 2018-10-14 NOTE — Telephone Encounter (Signed)
Patient needs an appointment, Lyrica is a controlled so we will have to fill in a visit.

## 2018-10-31 ENCOUNTER — Other Ambulatory Visit: Payer: Self-pay

## 2018-10-31 DIAGNOSIS — R928 Other abnormal and inconclusive findings on diagnostic imaging of breast: Secondary | ICD-10-CM

## 2018-10-31 NOTE — Progress Notes (Signed)
incomplete/abnormal screening mammogram  Orders faxed to [facility]. Patient has appt on 11/02/2018. MPulliam, CMA/RT(R)

## 2018-11-01 DIAGNOSIS — H16223 Keratoconjunctivitis sicca, not specified as Sjogren's, bilateral: Secondary | ICD-10-CM | POA: Diagnosis not present

## 2018-11-01 DIAGNOSIS — H5203 Hypermetropia, bilateral: Secondary | ICD-10-CM | POA: Diagnosis not present

## 2018-11-02 ENCOUNTER — Other Ambulatory Visit: Payer: Self-pay | Admitting: Family Medicine

## 2018-11-02 DIAGNOSIS — R928 Other abnormal and inconclusive findings on diagnostic imaging of breast: Secondary | ICD-10-CM | POA: Diagnosis not present

## 2018-11-02 DIAGNOSIS — R921 Mammographic calcification found on diagnostic imaging of breast: Secondary | ICD-10-CM

## 2018-11-02 DIAGNOSIS — R920 Mammographic microcalcification found on diagnostic imaging of breast: Secondary | ICD-10-CM | POA: Diagnosis not present

## 2018-11-07 ENCOUNTER — Ambulatory Visit
Admission: RE | Admit: 2018-11-07 | Discharge: 2018-11-07 | Disposition: A | Discharge status: Home / Self Care | Payer: Medicare Other | Source: Ambulatory Visit | Attending: Family Medicine | Admitting: Family Medicine

## 2018-11-07 ENCOUNTER — Other Ambulatory Visit: Payer: Self-pay

## 2018-11-07 DIAGNOSIS — D241 Benign neoplasm of right breast: Secondary | ICD-10-CM | POA: Diagnosis not present

## 2018-11-07 DIAGNOSIS — R921 Mammographic calcification found on diagnostic imaging of breast: Secondary | ICD-10-CM

## 2019-01-18 ENCOUNTER — Encounter: Payer: Self-pay | Admitting: Family Medicine

## 2019-01-18 ENCOUNTER — Ambulatory Visit (INDEPENDENT_AMBULATORY_CARE_PROVIDER_SITE_OTHER): Payer: Medicare Other | Admitting: Family Medicine

## 2019-01-18 DIAGNOSIS — F32A Depression, unspecified: Secondary | ICD-10-CM

## 2019-01-18 DIAGNOSIS — M5116 Intervertebral disc disorders with radiculopathy, lumbar region: Secondary | ICD-10-CM

## 2019-01-18 DIAGNOSIS — F419 Anxiety disorder, unspecified: Secondary | ICD-10-CM | POA: Diagnosis not present

## 2019-01-18 DIAGNOSIS — F329 Major depressive disorder, single episode, unspecified: Secondary | ICD-10-CM

## 2019-01-18 MED ORDER — PREGABALIN 100 MG PO CAPS: 100.0000 mg | ORAL_CAPSULE | Freq: Two times a day (BID) | ORAL | 5 refills | Status: DC

## 2019-01-18 NOTE — Progress Notes (Signed)
Virtual Visit via telephone Note  I connected with Brianna [name] on 01/18/19 at 1602 by telephone and verified that I am speaking with the correct person using two identifiers. Brianna [name] is currently located at home and no other people are currently with her during visit. The provider, Fransisca Kaufmann Lindey Renzulli, MD is located in their office at time of visit.  Call ended at 1610  I discussed the limitations, risks, security and privacy concerns of performing an evaluation and management service by telephone and the availability of in person appointments. I also discussed with the patient that there may be a patient responsible charge related to this service. The patient expressed understanding and agreed to proceed.   History and Present Illness: Anxiety and depression and lyrica.  She does well during the day with pain and then gets worse at night on her hip on the side that she is laying. She is taking during the morning and evening.  Patient says lyrica once per day was not enough but when she increased it to twice a day it has been helping, she has not been able to get in for refills and that is why she reduce it to once a day. She is mainly calling in for that refill. She gets back pain and hip pain sometimes Current rx-Lyrica 100 mg twice daily # meds rx-60 Effectiveness of current meds-working well when she takes it twice a day Adverse reactions form meds-none  Pill count performed-No Last drug screen -03/22/2018 ( high risk q74m, moderate risk q19m, low risk yearly ) Urine drug screen today- No Was the [place] reviewed-yes  If yes were their any concerning findings? -None  No flowsheet data found.   Controlled substance contract signed on: 03/22/2018  1. Anxiety and depression   2. Lumbar disc disease with radiculopathy     Outpatient Encounter Medications as of 01/18/2019  Medication Sig  . citalopram (CELEXA) 40 MG tablet Take 1 tablet (40 mg total) by mouth daily.  Marland Kitchen  dexlansoprazole (DEXILANT) 60 MG capsule Take 1 capsule (60 mg total) by mouth daily.  . fluticasone (FLONASE) 50 MCG/ACT nasal spray Place 1 spray into both nostrils 2 (two) times daily as needed for allergies or rhinitis.  . hydrochlorothiazide (MICROZIDE) 12.5 MG capsule Take 1 capsule (12.5 mg total) by mouth daily.  Marland Kitchen loratadine (CLARITIN) 10 MG tablet TAKE ONE TABLET BY MOUTH DAILY  . lubiprostone (AMITIZA) 24 MCG capsule Take 1 capsule (24 mcg total) by mouth 2 (two) times daily as needed for constipation.  . pregabalin (LYRICA) 100 MG capsule Take 1 capsule (100 mg total) by mouth 2 (two) times daily.  . [DISCONTINUED] predniSONE (DELTASONE) 10 MG tablet Take 5 daily for 2 days followed by 4,3,2 and 1 for 2 days each.  . [DISCONTINUED] pregabalin (LYRICA) 100 MG capsule Take 1 capsule (100 mg total) by mouth 2 (two) times daily.   No facility-administered encounter medications on file as of 01/18/2019.    Review of Systems  Constitutional: Negative for chills and fever.  Eyes: Negative for visual disturbance.  Respiratory: Negative for chest tightness and shortness of breath.   Cardiovascular: Negative for chest pain and leg swelling.  Musculoskeletal: Positive for arthralgias and back pain. Negative for gait problem.  Skin: Negative for rash.  Neurological: Negative for light-headedness and headaches.  Psychiatric/Behavioral: Negative for agitation and behavioral problems.  All other systems reviewed and are negative.   Observations/Objective: Patient sounds comfortable and in no acute distress  Assessment and Plan: Problem List Items Addressed This Visit      Nervous and Auditory   Lumbar disc disease with radiculopathy   Relevant Medications   pregabalin (LYRICA) 100 MG capsule     Other   Anxiety and depression - Primary   Relevant Medications   pregabalin (LYRICA) 100 MG capsule      Refill Lyrica and continue it, recommended that she can take it twice a day which  will help a lot more with her pain. Follow up plan: Return in about 6 months (around 07/18/2019), or if symptoms worsen or fail to improve, for pap smear and physical.     I discussed the assessment and treatment plan with the patient. The patient was provided an opportunity to ask questions and all were answered. The patient agreed with the plan and demonstrated an understanding of the instructions.   The patient was advised to call back or seek an in-person evaluation if the symptoms worsen or if the condition fails to improve as anticipated.  The above assessment and management plan was discussed with the patient. The patient verbalized understanding of and has agreed to the management plan. Patient is aware to call the clinic if symptoms persist or worsen. Patient is aware when to return to the clinic for a follow-up visit. Patient educated on when it is appropriate to go to the emergency department.    I provided 8 minutes of non-face-to-face time during this encounter.    Worthy Rancher, MD

## 2019-02-06 ENCOUNTER — Other Ambulatory Visit: Payer: Self-pay

## 2019-02-06 ENCOUNTER — Ambulatory Visit (INDEPENDENT_AMBULATORY_CARE_PROVIDER_SITE_OTHER): Payer: Medicare Other | Admitting: Family Medicine

## 2019-02-06 ENCOUNTER — Encounter: Payer: Self-pay | Admitting: Family Medicine

## 2019-02-06 DIAGNOSIS — H109 Unspecified conjunctivitis: Secondary | ICD-10-CM | POA: Diagnosis not present

## 2019-02-06 MED ORDER — TOBRAMYCIN-DEXAMETHASONE 0.3-0.1 % OP SUSP
OPHTHALMIC | 0 refills | Status: DC
Start: 2019-02-06 — End: 2019-07-19

## 2019-02-06 NOTE — Progress Notes (Signed)
Subjective:    Patient ID: Brianna [name], female    DOB: Feb 26, 1960, 59 y.o.   MRN: [id number]   HPI: Brianna [name] is a 59 y.o. female presenting for [place] and son seen last week for pink eye E.R. Now pt has noted swelling, green drainage onset 2 days ago. Constantly running. "Sinuses are jacked up. "    Depression screen [facility] 2/9 04/21/2018 03/21/2018 08/02/2017 07/20/2016 07/01/2016  Decreased Interest 0 1 1 0 0  Down, Depressed, Hopeless 0 1 1 0 1  PHQ - 2 Score 0 2 2 0 1  Altered sleeping 0 1 3 - -  Tired, decreased energy 0 1 3 - -  Change in appetite 0 1 3 - -  Feeling bad or failure about yourself  0 0 0 - -  Trouble concentrating 0 0 0 - -  Moving slowly or fidgety/restless 0 0 3 - -  Suicidal thoughts 0 0 0 - -  PHQ-9 Score 0 5 14 - -  Difficult doing work/chores - - - - -  Some recent data might be hidden     Relevant past medical, surgical, family and social history reviewed and updated as indicated.  Interim medical history since our last visit reviewed. Allergies and medications reviewed and updated.  ROS:  Review of Systems  Constitutional: Negative for fever.  HENT: Positive for congestion.   Eyes: Positive for pain, discharge, redness and itching. Negative for visual disturbance.     Social History   Tobacco Use  Smoking Status Former Smoker  . Packs/day: 0.50  . Years: 2.00  . Pack years: 1.00  . Types: Cigarettes  . Quit date: 01/06/2007  . Years since quitting: 12.0  Smokeless Tobacco Never Used       Objective:     Wt Readings from Last 3 Encounters:  04/21/18 169 lb (76.7 kg)  03/21/18 169 lb 3.2 oz (76.7 kg)  08/02/17 173 lb (78.5 kg)     Exam deferred. Pt. Harboring due to COVID 19. Phone visit performed.   Assessment & Plan:   1. Bacterial conjunctivitis     Meds ordered this encounter  Medications  . tobramycin-dexamethasone (TOBRADEX) ophthalmic solution    Sig: Apply 1 drop in affected eye(s) every 2 hours for  two days. Then every 4 hours for 5 days.    Dispense:  5 mL    Refill:  0    This appears to be a simple straightforward case of bacterial conjunctivitis.  However as usual the patient was reminded that if any changes in her vision occurred with regard to blurring etc. she should seek immediate attention through ER or ophthalmology.  Virtual Visit via telephone Note  I discussed the limitations, risks, security and privacy concerns of performing an evaluation and management service by telephone and the availability of in person appointments. The patient was identified with two identifiers. Pt.expressed understanding and agreed to proceed. Pt. Is at home. Dr. Livia Snellen is in his office.  Follow Up Instructions:   I discussed the assessment and treatment plan with the patient. The patient was provided an opportunity to ask questions and all were answered. The patient agreed with the plan and demonstrated an understanding of the instructions.   The patient was advised to call back or seek an in-person evaluation if the symptoms worsen or if the condition fails to improve as anticipated.   Total minutes including chart review and phone contact time: 16   Follow  up plan: No follow-ups on file.  Claretta Fraise, MD [place]

## 2019-02-10 ENCOUNTER — Telehealth: Payer: Self-pay | Admitting: Family Medicine

## 2019-02-10 ENCOUNTER — Telehealth (INDEPENDENT_AMBULATORY_CARE_PROVIDER_SITE_OTHER): Payer: Medicare Other | Admitting: Family Medicine

## 2019-02-10 ENCOUNTER — Encounter: Payer: Self-pay | Admitting: Family Medicine

## 2019-02-10 DIAGNOSIS — L03213 Periorbital cellulitis: Secondary | ICD-10-CM

## 2019-02-10 MED ORDER — AMOXICILLIN-POT CLAVULANATE 875-125 MG PO TABS
1.0000 | ORAL_TABLET | Freq: Two times a day (BID) | ORAL | 0 refills | Status: AC
Start: 2019-02-10 — End: 2019-02-17

## 2019-02-10 MED ORDER — SULFAMETHOXAZOLE-TRIMETHOPRIM 800-160 MG PO TABS: 1.0000 | ORAL_TABLET | Freq: Two times a day (BID) | ORAL | 0 refills | Status: AC

## 2019-02-10 NOTE — Telephone Encounter (Signed)
What symptoms do you have? Left eyeball is red and it gets stuck in the morning  How long have you been sick? For about a week  Have you been seen for this problem? Yes televisit she is using the eye drops prescribed wants to know if this is how it will be until it gets better  If your provider decides to give you a prescription, which pharmacy would you like for it to be sent to? [facility]   Patient informed that this information will be sent to the clinical staff for review and that they should receive a follow up call.

## 2019-02-10 NOTE — Telephone Encounter (Signed)
If tobradex is not working it is probably viral. Can meake appointmnet o see eye doctor.

## 2019-02-10 NOTE — Telephone Encounter (Signed)
What else can be done to help with the infected eye?

## 2019-02-10 NOTE — Telephone Encounter (Signed)
Left message with provider's advice.  Please call our office for questions.

## 2019-02-10 NOTE — Progress Notes (Signed)
Virtual Visit via MyChart Video Note Due to COVID-19 pandemic this visit was conduc ted virtually. This visit type was conducted due to national recommendations for restrictions regarding the COVID-19 Pandemic (e.g. social distancing, sheltering in place) in an effort to limit thi                  s patient's exposure and mitigate transmission in our community. All issues noted in this document were discussed and addressed.  A physical exam was not performed with this format.   I connected with Brianna [name] on 02/10/2019 at 1145 by MyChart Video and verified that I am speaking with the correct person using two identifiers. Brianna [name] is currently located in her car and no one is currently with them during visit. The provider, Monia Pouch, FNP is located in their office at time of visit.  I discussed the limitations, risks, security and privacy concerns of performing an evaluation and management service by telephone and the availability of in person appointments. I also discussed with the patient that there may be a patient responsible charge related to this service. The patient expressed understanding and agreed to proceed.  Subjective:  Patient ID: Brianna [name], female    DOB: [birth year], 59 y.o.   MRN: [id number]  Chief Complaint:  Eye Problem   HPI: Brianna [name] is a 59 y.o. female presenting on 02/10/2019 for Eye Problem   Pt reports recent treatment for conjunctivitis of her left eye. Pt states she continue to have swelling of her left lower eyelid with erythema and drainage. Pt states she now has redness in her right eye. No pain or drainage. States her left eye is slightly tender to touch but denies pain with occular movement. No known injury. Was exposed to pinkeye.   Eye Problem  Both eyes are affected.This is a new problem. The current episode started 1 to 4 weeks ago. The problem occurs constantly. The problem has been gradually worsening. There was no injury  mechanism. The pain is at a severity of 3/10. The pain is mild. There is known exposure to pink eye. She does not wear contacts. Associated symptoms include blurred vision, an eye discharge, eye redness and itching. Pertinent negatives include no double vision, fever, foreign body sensation, nausea, photophobia, recent URI, vomiting or weakness. She has tried eye drops for the symptoms. The treatment provided no relief.     Relevant past medical, surgical, family, and social history reviewed and updated as indicated.  Allergies and medications reviewed and updated.   Past Medical History:  Diagnosis Date  . Anemia   . Anxiety   . Arthritis   . Chronic back pain   . Chronic neck pain   . Chronic right hip pain since 09/2012  . DDD (degenerative disc disease), cervical   . Depression   . Diverticulitis   . Fibromyalgia   . GERD (gastroesophageal reflux disease)   . Headache   . Hypertension   . Pancreatitis     Past Surgical History:  Procedure Laterality Date  . CESAREAN SECTION    . CHOLECYSTECTOMY N/A 08/16/2014   Procedure: LAPAROSCOPIC CHOLECYSTECTOMY WITH CHOLANGIOGRAM;  Surgeon: Sherri Rad, MD;  Location: [facility] ORS;  Service: General;  Laterality: N/A;  . COLONOSCOPY N/A 08/22/2013   [id number] diverticulosis paper hemtochezia from anorectal irritation  . ESOPHAGOGASTRODUODENOSCOPY N/A 08/22/2013   [id number] reflux esophagitis/noncritical schatzki's ring and hiatial hernia  . OOPHORECTOMY      Social History  Socioeconomic History  . Marital status: Widowed    Spouse name: Not on file  . Number of children: Not on file  . Years of education: Not on file  . Highest education level: Not on file  Occupational History    Comment: hasn't worked since October 2014. Filing for disability  Tobacco Use  . Smoking status: Former Smoker    Packs/day: 0.50    Years: 2.00    Pack years: 1.00    Types: Cigarettes    Quit date: 01/06/2007    Years since quitting: 12.1  .  Smokeless tobacco: Never Used  Substance and Sexual Activity  . Alcohol use: No  . Drug use: No  . Sexual activity: Never  Other Topics Concern  . Not on file  Social History Narrative  . Not on file   Social Determinants of Health   Financial Resource Strain:   . Difficulty of Paying Living Expenses: Not on file  Food Insecurity:   . Worried About Charity fundraiser in the Last Year: Not on file  . Ran Out of Food in the Last Year: Not on file  Transportation Needs:   . Lack of Transportation (Medical): Not on file  . Lack of Transportation (Non-Medical): Not on file  Physical Activity:   . Days of Exercise per Week: Not on file  . Minutes of Exercise per Session: Not on file  Stress:   . Feeling of Stress : Not on file  Social Connections:   . Frequency of Communication with Friends and Family: Not on file  . Frequency of Social Gatherings with Friends and Family: Not on file  . Attends Religious Services: Not on file  . Active Member of Clubs or Organizations: Not on file  . Attends Archivist Meetings: Not on file  . Marital Status: Not on file  Intimate Partner Violence:   . Fear of Current or Ex-Partner: Not on file  . Emotionally Abused: Not on file  . Physically Abused: Not on file  . Sexually Abused: Not on file    Outpatient Encounter Medications as of 02/10/2019  Medication Sig  . amoxicillin-clavulanate (AUGMENTIN) 875-125 MG tablet Take 1 tablet by mouth 2 (two) times daily for 7 days.  . citalopram (CELEXA) 40 MG tablet Take 1 tablet (40 mg total) by mouth daily.  Marland Kitchen dexlansoprazole (DEXILANT) 60 MG capsule Take 1 capsule (60 mg total) by mouth daily.  . fluticasone (FLONASE) 50 MCG/ACT nasal spray Place 1 spray into both nostrils 2 (two) times daily as needed for allergies or rhinitis.  . hydrochlorothiazide (MICROZIDE) 12.5 MG capsule Take 1 capsule (12.5 mg total) by mouth daily.  Marland Kitchen loratadine (CLARITIN) 10 MG tablet TAKE ONE TABLET BY MOUTH  DAILY  . lubiprostone (AMITIZA) 24 MCG capsule Take 1 capsule (24 mcg total) by mouth 2 (two) times daily as needed for constipation.  . pregabalin (LYRICA) 100 MG capsule Take 1 capsule (100 mg total) by mouth 2 (two) times daily.  Marland Kitchen sulfamethoxazole-trimethoprim (BACTRIM DS) 800-160 MG tablet Take 1 tablet by mouth 2 (two) times daily for 7 days.  Marland Kitchen tobramycin-dexamethasone (TOBRADEX) ophthalmic solution Apply 1 drop in affected eye(s) every 2 hours for two days. Then every 4 hours for 5 days.   No facility-administered encounter medications on file as of 02/10/2019.    Allergies  Allergen Reactions  . Tylenol With Codeine #3 [Acetaminophen-Codeine] Other (See Comments)    headache    Review of Systems  Constitutional: Negative  for activity change, appetite change, chills, diaphoresis, fatigue, fever and unexpected weight change.  HENT: Negative.   Eyes: Positive for blurred vision, pain, discharge, redness, itching and visual disturbance. Negative for double vision and photophobia.  Respiratory: Negative for cough, chest tightness and shortness of breath.   Cardiovascular: Negative for chest pain, palpitations and leg swelling.  Gastrointestinal: Negative for blood in stool, constipation, diarrhea, nausea and vomiting.  Endocrine: Negative.   Genitourinary: Negative for decreased urine volume, difficulty urinating, dysuria, frequency and urgency.  Musculoskeletal: Negative for arthralgias and myalgias.  Skin: Negative.   Allergic/Immunologic: Negative.   Neurological: Negative for dizziness, tremors, seizures, syncope, facial asymmetry, weakness, light-headedness, numbness and headaches.  Hematological: Negative.   Psychiatric/Behavioral: Negative for confusion, hallucinations, sleep disturbance and suicidal ideas.  All other systems reviewed and are negative.        Observations/Objective: No vital signs or hands on physical, as this was a Sports administrator health  encounter.  Pt alert and oriented, answers all questions appropriately, and able to speak in full sentences.  Redness, drainage, and swelling noted to left eye. Redness noted to right eye. Pt denies pain with occular motion. States slight blurring of vision in left eye but able to see.    Assessment and Plan: Shakisha was seen today for eye problem.  Diagnoses and all orders for this visit:  Preseptal cellulitis of left eye No red flags concerning for occular emergency. Symptoms and virtual video exam suggestive of preseptal cellulitis. No occular motion pain. Will initiate below. Pt aware of symptoms that require emergent evaluation and treatment. Follow up in 1 week for reevaluation. Report any new, worsening, or persistent symptoms.  -     amoxicillin-clavulanate (AUGMENTIN) 875-125 MG tablet; Take 1 tablet by mouth 2 (two) times daily for 7 days. -     sulfamethoxazole-trimethoprim (BACTRIM DS) 800-160 MG tablet; Take 1 tablet by mouth 2 (two) times daily for 7 days.     Follow Up Instructions: Return in about 1 week (around 02/17/2019), or if symptoms worsen or fail to improve, for eye.    I discussed the assessment and treatment plan with the patient. The patient was provided an opportunity to ask questions and all were answered. The patient agreed with the plan and demonstrated an understanding of the instructions.   The patient was advised to call back or seek an in-person evaluation if the symptoms worsen or if the condition fails to improve as anticipated.  The above assessment and management plan was discussed with the patient. The patient verbalized understanding of and has agreed to the management plan. Patient is aware to call the clinic if they develop any new symptoms or if symptoms persist or worsen. Patient is aware when to return to the clinic for a follow-up visit. Patient educated on when it is appropriate to go to the emergency department.    I provided 15 minutes  of video-face-to-face time during this encounter. The video started at 1145. The video ended at 1200. The other time was used for coordination of care.    Monia Pouch, FNP-C [facility] [address] [place], [place] [address] [phone] 02/10/2019

## 2019-02-13 ENCOUNTER — Ambulatory Visit: Payer: Medicare Other | Attending: Internal Medicine

## 2019-02-13 ENCOUNTER — Other Ambulatory Visit: Payer: Self-pay

## 2019-02-13 DIAGNOSIS — Z20822 Contact with and (suspected) exposure to covid-19: Secondary | ICD-10-CM

## 2019-02-14 LAB — NOVEL CORONAVIRUS, NAA: SARS-CoV-2, NAA: NOT DETECTED

## 2019-02-15 ENCOUNTER — Telehealth (INDEPENDENT_AMBULATORY_CARE_PROVIDER_SITE_OTHER): Payer: Medicare Other | Admitting: Family Medicine

## 2019-02-15 ENCOUNTER — Encounter: Payer: Self-pay | Admitting: Family Medicine

## 2019-02-15 DIAGNOSIS — H539 Unspecified visual disturbance: Secondary | ICD-10-CM

## 2019-02-15 DIAGNOSIS — H019 Unspecified inflammation of eyelid: Secondary | ICD-10-CM | POA: Diagnosis not present

## 2019-02-15 NOTE — Progress Notes (Signed)
Subjective:    Patient ID: Brianna [name], female    DOB: 27-Oct-1960, 59 y.o.   MRN: [id number]   HPI: Brianna [name] is a 59 y.o. female presenting for continued redness pain and swelling of the left eye. She states that the vision on the left is blurred. No fever, congestion or cough.    Depression screen [facility] 2/9 04/21/2018 03/21/2018 08/02/2017 07/20/2016 07/01/2016  Decreased Interest 0 1 1 0 0  Down, Depressed, Hopeless 0 1 1 0 1  PHQ - 2 Score 0 2 2 0 1  Altered sleeping 0 1 3 - -  Tired, decreased energy 0 1 3 - -  Change in appetite 0 1 3 - -  Feeling bad or failure about yourself  0 0 0 - -  Trouble concentrating 0 0 0 - -  Moving slowly or fidgety/restless 0 0 3 - -  Suicidal thoughts 0 0 0 - -  PHQ-9 Score 0 5 14 - -  Difficult doing work/chores - - - - -  Some recent data might be hidden     Relevant past medical, surgical, family and social history reviewed and updated as indicated.  Interim medical history since our last visit reviewed. Allergies and medications reviewed and updated.  ROS:  Review of Systems  Constitutional: Negative.   HENT: Negative.   Eyes: Negative for visual disturbance.  Respiratory: Negative for shortness of breath.   Cardiovascular: Negative for chest pain.  Gastrointestinal: Negative for abdominal pain.  Musculoskeletal: Negative for arthralgias.     Social History   Tobacco Use  Smoking Status Former Smoker  . Packs/day: 0.50  . Years: 2.00  . Pack years: 1.00  . Types: Cigarettes  . Quit date: 01/06/2007  . Years since quitting: 12.1  Smokeless Tobacco Never Used       Objective:     Wt Readings from Last 3 Encounters:  04/21/18 169 lb (76.7 kg)  03/21/18 169 lb 3.2 oz (76.7 kg)  08/02/17 173 lb (78.5 kg)     Exam deferred. Pt. Harboring due to COVID 19. Video visit performed. The left lid is edematous and erythematous.   Assessment & Plan:   1. Vision changes   2. Infected eye lid     Pt. Was  advised to seek specialty eye care immediately due to vision change. I offerred ophthalmology referral, but pt has an eye doctor in [place], Dr. Mayford Knife that she prefers to call. I urged her to call right away.      Diagnoses and all orders for this visit:  Vision changes  Infected eye lid    Virtual Visit via telephone Note  I discussed the limitations, risks, security and privacy concerns of performing an evaluation and management service by telephone and the availability of in person appointments. The patient was identified with two identifiers. Pt.expressed understanding and agreed to proceed. Pt. Is at home. Dr. Darlyn Read is in his office.  Follow Up Instructions:   I discussed the assessment and treatment plan with the patient. The patient was provided an opportunity to ask questions and all were answered. The patient agreed with the plan and demonstrated an understanding of the instructions.   The patient was advised to call back or seek an in-person evaluation if the symptoms worsen or if the condition fails to improve as anticipated.   Total minutes including chart review and phone contact time: 21   Follow up plan: No follow-ups on file.  Broadus John  Jailen Lung, MD [place] [facility]

## 2019-02-20 DIAGNOSIS — B309 Viral conjunctivitis, unspecified: Secondary | ICD-10-CM | POA: Diagnosis not present

## 2019-03-02 ENCOUNTER — Ambulatory Visit: Payer: Medicare Other

## 2019-03-07 DIAGNOSIS — H16223 Keratoconjunctivitis sicca, not specified as Sjogren's, bilateral: Secondary | ICD-10-CM | POA: Diagnosis not present

## 2019-04-05 ENCOUNTER — Other Ambulatory Visit: Payer: Self-pay | Admitting: Family Medicine

## 2019-04-05 DIAGNOSIS — K219 Gastro-esophageal reflux disease without esophagitis: Secondary | ICD-10-CM

## 2019-04-26 ENCOUNTER — Other Ambulatory Visit: Payer: Self-pay | Admitting: Family Medicine

## 2019-04-26 DIAGNOSIS — F32A Depression, unspecified: Secondary | ICD-10-CM

## 2019-04-26 DIAGNOSIS — F329 Major depressive disorder, single episode, unspecified: Secondary | ICD-10-CM

## 2019-04-26 NOTE — Telephone Encounter (Signed)
OV1/13/21 rtc 6 mos

## 2019-05-29 ENCOUNTER — Other Ambulatory Visit: Payer: Self-pay | Admitting: Family Medicine

## 2019-05-29 DIAGNOSIS — K219 Gastro-esophageal reflux disease without esophagitis: Secondary | ICD-10-CM

## 2019-06-15 ENCOUNTER — Encounter: Payer: Self-pay | Admitting: Family

## 2019-06-15 ENCOUNTER — Ambulatory Visit (INDEPENDENT_AMBULATORY_CARE_PROVIDER_SITE_OTHER): Payer: Medicare Other | Admitting: Family

## 2019-06-15 DIAGNOSIS — J019 Acute sinusitis, unspecified: Secondary | ICD-10-CM | POA: Diagnosis not present

## 2019-06-15 MED ORDER — AMOXICILLIN-POT CLAVULANATE 875-125 MG PO TABS: 1.0000 | ORAL_TABLET | Freq: Two times a day (BID) | ORAL | 0 refills | Status: DC

## 2019-06-15 NOTE — Progress Notes (Signed)
**Note Brianna [name]** Virtual Visit via telephone Note Due to COVID-19 pandemic this visit was conducted virtually. This visit type was conducted due to national recommendations for restrictions regarding the COVID-19 Pandemic (e.g. social distancing, sheltering in place) in an effort to limit this patient's exposure and mitigate transmission in our community. All issues noted in this document were discussed and addressed.  A physical exam was not performed with this format.  I connected with [name] on 06/15/19 at 9:56 AM  by telephone and verified that I am speaking with the correct person using two identifiers. Brianna [name] is currently located at home and no one is currently with her during visit. The provider, Evelina Dun, FNP is located in their office at time of visit.  I discussed the limitations, risks, security and privacy concerns of performing an evaluation and management service by telephone and the availability of in person appointments. I also discussed with the patient that there may be a patient responsible charge related to this service. The patient expressed understanding and agreed to proceed.   History and Present Illness:  Cough This is a new problem. The current episode started 1 to 4 weeks ago. The problem has been gradually worsening. The problem occurs every few minutes. The cough is non-productive. Associated symptoms include ear congestion, ear pain (right), headaches, myalgias, nasal congestion, postnasal drip, rhinorrhea and wheezing. Pertinent negatives include no chills, fever or shortness of breath. She has tried rest (Claritin, flonase) for the symptoms. The treatment provided mild relief. There is no history of asthma or emphysema.  Sinusitis This is a new problem. The current episode started in the past 7 days. The problem has been waxing and waning since onset. There has been no fever. Her pain is at a severity of 4/10. The pain is moderate. Associated symptoms  include coughing, ear pain (right), headaches, a hoarse voice, sinus pressure and sneezing. Pertinent negatives include no chills or shortness of breath.      Review of Systems  Constitutional: Negative for chills and fever.  HENT: Positive for ear pain (right), hoarse voice, postnasal drip, rhinorrhea, sinus pressure and sneezing.   Respiratory: Positive for cough and wheezing. Negative for shortness of breath.   Musculoskeletal: Positive for myalgias.  Neurological: Positive for headaches.     Observations/Objective: No SOB or distress noted, cough, hoarse voice noted.   Assessment and Plan: 1. Acute sinusitis, recurrence not specified, unspecified location - Take meds as prescribed - Use a cool mist humidifier  -Use saline nose sprays frequently -Force fluids -For any cough or congestion  Use plain Mucinex- regular strength or max strength is fine -For fever or aces or pains- take tylenol or ibuprofen. -Throat lozenges if help -RTO if symptoms worsen or do not improve  - amoxicillin-clavulanate (AUGMENTIN) 875-125 MG tablet; Take 1 tablet by mouth 2 (two) times daily.  Dispense: 14 tablet; Refill: 0     I discussed the assessment and treatment plan with the patient. The patient was provided an opportunity to ask questions and all were answered. The patient agreed with the plan and demonstrated an understanding of the instructions.   The patient was advised to call back or seek an in-person evaluation if the symptoms worsen or if the condition fails to improve as anticipated.  The above assessment and management plan was discussed with the patient. The patient verbalized understanding of and has agreed to the management plan. Patient is aware to call the clinic if symptoms persist or worsen. Patient  is aware when to return to the clinic for a follow-up visit. Patient educated on when it is appropriate to go to the emergency department.   Time call ended:  10:03 AM   I  provided 7 minutes of non-face-to-face time during this encounter.    Evelina Dun, FNP

## 2019-07-06 ENCOUNTER — Other Ambulatory Visit: Payer: Self-pay | Admitting: Family Medicine

## 2019-07-06 ENCOUNTER — Telehealth: Payer: Self-pay | Admitting: Family Medicine

## 2019-07-06 DIAGNOSIS — K219 Gastro-esophageal reflux disease without esophagitis: Secondary | ICD-10-CM

## 2019-07-06 DIAGNOSIS — I10 Essential (primary) hypertension: Secondary | ICD-10-CM

## 2019-07-06 MED ORDER — HYDROCHLOROTHIAZIDE 12.5 MG PO CAPS: 12.5000 mg | ORAL_CAPSULE | Freq: Every day | ORAL | 0 refills | Status: DC

## 2019-07-06 MED ORDER — DEXILANT 60 MG PO CPDR: 1.0000 | DELAYED_RELEASE_CAPSULE | Freq: Every day | ORAL | 0 refills | Status: DC

## 2019-07-06 NOTE — Telephone Encounter (Signed)
Refill sent needs to be seen for further refills

## 2019-07-06 NOTE — Telephone Encounter (Signed)
  Prescription Request  07/06/2019  What is the name of the medication or equipment?  hydrochlorothiazide (MICROZIDE) 12.5 MG capsule dexlansoprazole (DEXILANT) 60 MG capsule  Have you contacted your pharmacy to request a refill? (if applicable) yes  Which pharmacy would you like this sent to? [facility]  Pt is going out town Architectural technologist. She noticed today that she has been out bp med but she confused it with another med. She is completely out. Pt schedule an apt for 07/19/2019 at 8:55 with Dettinger   Patient notified that their request is being sent to the clinical staff for review and that they should receive a response within 2 business days.

## 2019-07-19 ENCOUNTER — Other Ambulatory Visit: Payer: Self-pay

## 2019-07-19 ENCOUNTER — Encounter: Payer: Self-pay | Admitting: Family Medicine

## 2019-07-19 ENCOUNTER — Ambulatory Visit (INDEPENDENT_AMBULATORY_CARE_PROVIDER_SITE_OTHER): Payer: Medicare Other | Admitting: Family Medicine

## 2019-07-19 VITALS — BP 105/75 | HR 76 | Temp 97.4°F | Ht 63.0 in | Wt 173.0 lb

## 2019-07-19 DIAGNOSIS — I10 Essential (primary) hypertension: Secondary | ICD-10-CM

## 2019-07-19 DIAGNOSIS — K219 Gastro-esophageal reflux disease without esophagitis: Secondary | ICD-10-CM | POA: Diagnosis not present

## 2019-07-19 DIAGNOSIS — M5116 Intervertebral disc disorders with radiculopathy, lumbar region: Secondary | ICD-10-CM | POA: Diagnosis not present

## 2019-07-19 DIAGNOSIS — F32A Depression, unspecified: Secondary | ICD-10-CM

## 2019-07-19 DIAGNOSIS — F419 Anxiety disorder, unspecified: Secondary | ICD-10-CM

## 2019-07-19 DIAGNOSIS — M7551 Bursitis of right shoulder: Secondary | ICD-10-CM | POA: Diagnosis not present

## 2019-07-19 DIAGNOSIS — F329 Major depressive disorder, single episode, unspecified: Secondary | ICD-10-CM

## 2019-07-19 LAB — CMP14+EGFR
ALT: 14 IU/L (ref 0–32)
AST: 19 IU/L (ref 0–40)
Albumin/Globulin Ratio: 1.5 (ref 1.2–2.2)
Albumin: 4.1 g/dL (ref 3.8–4.9)
Alkaline Phosphatase: 100 IU/L (ref 48–121)
BUN/Creatinine Ratio: 12 (ref 9–23)
BUN: 12 mg/dL (ref 6–24)
Bilirubin Total: 0.5 mg/dL (ref 0.0–1.2)
CO2: 25 mmol/L (ref 20–29)
Calcium: 9.6 mg/dL (ref 8.7–10.2)
Chloride: 103 mmol/L (ref 96–106)
Creatinine, Ser: 0.98 mg/dL (ref 0.57–1.00)
GFR calc Af Amer: 74 mL/min/{1.73_m2} (ref >59)
GFR calc non Af Amer: 64 mL/min/{1.73_m2} (ref >59)
Globulin, Total: 2.8 g/dL (ref 1.5–4.5)
Glucose: 88 mg/dL (ref 65–99)
Potassium: 3.7 mmol/L (ref 3.5–5.2)
Sodium: 140 mmol/L (ref 134–144)
Total Protein: 6.9 g/dL (ref 6.0–8.5)

## 2019-07-19 LAB — LIPID PANEL
Chol/HDL Ratio: 4 ratio (ref 0.0–4.4)
Cholesterol, Total: 221 mg/dL — ABNORMAL HIGH (ref 100–199)
HDL: 55 mg/dL (ref >39)
LDL Chol Calc (NIH): 133 mg/dL — ABNORMAL HIGH (ref 0–99)
Triglycerides: 185 mg/dL — ABNORMAL HIGH (ref 0–149)
VLDL Cholesterol Cal: 33 mg/dL (ref 5–40)

## 2019-07-19 LAB — CBC WITH DIFFERENTIAL/PLATELET
Basophils Absolute: 0.1 10*3/uL (ref 0.0–0.2)
Basos: 1 %
EOS (ABSOLUTE): 0.2 10*3/uL (ref 0.0–0.4)
Eos: 4 %
Hematocrit: 41.1 % (ref 34.0–46.6)
Hemoglobin: 14.2 g/dL (ref 11.1–15.9)
Immature Grans (Abs): 0 10*3/uL (ref 0.0–0.1)
Immature Granulocytes: 0 %
Lymphocytes Absolute: 2.2 10*3/uL (ref 0.7–3.1)
Lymphs: 39 %
MCH: 29.1 pg (ref 26.6–33.0)
MCHC: 34.5 g/dL (ref 31.5–35.7)
MCV: 84 fL (ref 79–97)
Monocytes Absolute: 0.6 10*3/uL (ref 0.1–0.9)
Monocytes: 11 %
Neutrophils Absolute: 2.5 10*3/uL (ref 1.4–7.0)
Neutrophils: 45 %
Platelets: 343 10*3/uL (ref 150–450)
RBC: 4.88 x10E6/uL (ref 3.77–5.28)
RDW: 12.2 % (ref 11.7–15.4)
WBC: 5.6 10*3/uL (ref 3.4–10.8)

## 2019-07-19 MED ORDER — PREGABALIN 100 MG PO CAPS: 100.0000 mg | ORAL_CAPSULE | Freq: Two times a day (BID) | ORAL | 5 refills | Status: DC

## 2019-07-19 MED ORDER — CITALOPRAM HYDROBROMIDE 40 MG PO TABS: 40.0000 mg | ORAL_TABLET | Freq: Every day | ORAL | 3 refills | Status: DC

## 2019-07-19 MED ORDER — HYDROCHLOROTHIAZIDE 12.5 MG PO CAPS: 12.5000 mg | ORAL_CAPSULE | Freq: Every day | ORAL | 3 refills | Status: DC

## 2019-07-19 MED ORDER — LORATADINE 10 MG PO TABS: 10.0000 mg | ORAL_TABLET | Freq: Every day | ORAL | 3 refills | Status: DC

## 2019-07-19 MED ORDER — DEXILANT 60 MG PO CPDR: 1.0000 | DELAYED_RELEASE_CAPSULE | Freq: Every day | ORAL | 3 refills | Status: DC

## 2019-07-19 MED ORDER — METHYLPREDNISOLONE ACETATE 80 MG/ML IJ SUSP
80.0000 mg | Freq: Once | INTRAMUSCULAR | Status: AC
  Administered 2019-07-19: 80 mg via INTRAMUSCULAR

## 2019-07-19 NOTE — Progress Notes (Signed)
BP 105/75   Pulse 76   Temp (!) 97.4 F (36.3 C)   Ht 5\' 3"  (1.6 m)   Wt 173 lb (78.5 kg)   LMP 04/14/2012   SpO2 95%   BMI 30.65 kg/m    Subjective:   Patient ID: Brianna [name], female    DOB: Jun 19, 1960, 59 y.o.   MRN: [id number]  HPI: Brianna [name] is a 59 y.o. female presenting on 07/19/2019 for Medical Management of Chronic Issues, Gastroesophageal Reflux, and Anxiety   HPI Hypertension Patient is currently on hydrochlorothiazide, and their blood pressure today is 105/75. Patient denies any lightheadedness or dizziness. Patient denies headaches, blurred vision, chest pains, shortness of breath, or weakness. Denies any side effects from medication and is content with current medication.   GERD Patient is currently on Dexilant.  She denies any major symptoms or abdominal pain or belching or burping. She denies any blood in her stool or lightheadedness or dizziness.   Anxiety and depression and radicular symptoms. Patient takes medicine for anxiety and depression and radicular symptoms and pain from lumbar radiculopathy and cervical.  Patient is currently taking Celexa and Lyrica and feels like they are doing well for her. Current rx-Lyrica 100 mg twice daily # meds rx-60 Effectiveness of current meds-works well Adverse reactions form meds-none  Pill count performed-No Last drug screen -03/22/2018 ( high risk q47m, moderate risk q9m, low risk yearly ) Urine drug screen today- Yes Was the NCCSR reviewed-yes  If yes were their any concerning findings? -None  No flowsheet data found.   Controlled substance contract signed on: Today  Relevant past medical, surgical, family and social history reviewed and updated as indicated. Interim medical history since our last visit reviewed. Allergies and medications reviewed and updated.  Review of Systems  Constitutional: Negative for chills and fever.  Eyes: Negative for redness and visual disturbance.  Respiratory:  Negative for chest tightness and shortness of breath.   Cardiovascular: Negative for chest pain and leg swelling.  Musculoskeletal: Positive for arthralgias and back pain. Negative for gait problem.  Skin: Negative for rash.  Neurological: Negative for light-headedness and headaches.  Psychiatric/Behavioral: Negative for agitation and behavioral problems.  All other systems reviewed and are negative.   Per HPI unless around specifically indicated above   Allergies as of 07/19/2019      Reactions   Tylenol With Codeine #3 [acetaminophen-codeine] Other (See Comments)   headache      Medication List       Accurate as of July 19, 2019  9:45 AM. If you have any questions, ask your nurse or doctor.        STOP taking these medications   amoxicillin-clavulanate 875-125 MG tablet Commonly known as: AUGMENTIN Stopped by: Nils Pyle, MD   tobramycin-dexamethasone ophthalmic solution Commonly known as: TobraDex Stopped by: Elige Radon Keegan Bensch, MD     TAKE these medications   citalopram 40 MG tablet Commonly known as: CELEXA Take 1 tablet (40 mg total) by mouth daily.   Dexilant 60 MG capsule Generic drug: dexlansoprazole Take 1 capsule (60 mg total) by mouth daily. What changed: additional instructions Changed by: Elige Radon Akilah Cureton, MD   fluticasone 50 MCG/ACT nasal spray Commonly known as: FLONASE Place 1 spray into both nostrils 2 (two) times daily as needed for allergies or rhinitis.   hydrochlorothiazide 12.5 MG capsule Commonly known as: MICROZIDE Take 1 capsule (12.5 mg total) by mouth daily. What changed: additional instructions Changed by: Elige Radon  Verle Brillhart, MD   loratadine 10 MG tablet Commonly known as: CLARITIN Take 1 tablet (10 mg total) by mouth daily.   lubiprostone 24 MCG capsule Commonly known as: AMITIZA Take 1 capsule (24 mcg total) by mouth 2 (two) times daily as needed for constipation.   pregabalin 100 MG capsule Commonly known as:  LYRICA Take 1 capsule (100 mg total) by mouth 2 (two) times daily. What changed: Another medication with the same name was removed. Continue taking this medication, and follow the directions you see here. Changed by: Elige Radon Tina Gruner, MD   Restasis 0.05 % ophthalmic emulsion Generic drug: cycloSPORINE 1 drop 2 (two) times daily.        Objective:   BP 105/75   Pulse 76   Temp (!) 97.4 F (36.3 C)   Ht 5\' 3"  (1.6 m)   Wt 173 lb (78.5 kg)   LMP 04/14/2012   SpO2 95%   BMI 30.65 kg/m   Wt Readings from Last 3 Encounters:  07/19/19 173 lb (78.5 kg)  04/21/18 169 lb (76.7 kg)  03/21/18 169 lb 3.2 oz (76.7 kg)    Physical Exam Vitals and nursing note reviewed.  Constitutional:      General: She is not in acute distress.    Appearance: She is well-developed. She is not diaphoretic.  Eyes:     Conjunctiva/sclera: Conjunctivae normal.  Cardiovascular:     Rate and Rhythm: Normal rate and regular rhythm.     Heart sounds: Normal heart sounds. No murmur heard.   Pulmonary:     Effort: Pulmonary effort is normal. No respiratory distress.     Breath sounds: Normal breath sounds. No wheezing.  Skin:    General: Skin is warm and dry.     Findings: No rash.  Neurological:     Mental Status: She is alert and oriented to person, place, and time.     Coordination: Coordination normal.  Psychiatric:        Behavior: Behavior normal.     Right subacromial shoulder injection: Consent form signed. Risk factors of bleeding and infection discussed with patient and patient is agreeable towards injection. Patient prepped with Betadine.  Posterior approach towards injection used. Injected 80 mg of Depo-Medrol and 1 mL of 2% lidocaine. Patient tolerated procedure well and no side effects from noted. Minimal to no bleeding. Simple bandage applied after.   Assessment & Plan:   Problem List Items Addressed This Visit      Cardiovascular and Mediastinum   Essential hypertension, benign    Relevant Medications   hydrochlorothiazide (MICROZIDE) 12.5 MG capsule     Digestive   GERD (gastroesophageal reflux disease)   Relevant Medications   dexlansoprazole (DEXILANT) 60 MG capsule     Nervous and Auditory   Lumbar disc disease with radiculopathy - Primary   Relevant Medications   citalopram (CELEXA) 40 MG tablet   pregabalin (LYRICA) 100 MG capsule   Other Relevant Orders   ToxASSURE Select 13 (MW), Urine     Other   Anxiety and depression   Relevant Medications   citalopram (CELEXA) 40 MG tablet    Other Visit Diagnoses    Bursitis of right shoulder       Relevant Medications   methylPREDNISolone acetate (DEPO-MEDROL) injection 80 mg      Patient is coming in for refill, will give Lyrica that she has been using for her radiculopathy and has been helping, continue citalopram and Dexilant.  We discussed blood pressure and  how she is down, she is not symptomatic from it but if she develops any symptoms then she should start cutting her hydrochlorothiazide in half. Follow up plan: Return in about 6 months (around 01/19/2020), or if symptoms worsen or fail to improve, for Hypertension and cholesterol and anxiety depression.  Counseling provided for all of the vaccine components Orders Placed This Encounter  Procedures  . ToxASSURE Select 13 (MW), Urine    Arville Care, MD [facility] 07/19/2019, 9:45 AM

## 2019-07-22 LAB — TOXASSURE SELECT 13 (MW), URINE

## 2019-07-26 MED ORDER — ATORVASTATIN CALCIUM 20 MG PO TABS
20.0000 mg | ORAL_TABLET | Freq: Every day | ORAL | 3 refills | Status: DC
Start: 2019-07-26 — End: 2020-06-14

## 2019-07-26 NOTE — Addendum Note (Signed)
Addended by: Brynda Peon F on: 07/26/2019 10:26 AM   Modules accepted: Orders

## 2019-10-23 ENCOUNTER — Other Ambulatory Visit: Payer: Self-pay

## 2019-10-23 ENCOUNTER — Ambulatory Visit (INDEPENDENT_AMBULATORY_CARE_PROVIDER_SITE_OTHER): Payer: Medicare Other | Admitting: Family Medicine

## 2019-10-23 ENCOUNTER — Encounter: Payer: Self-pay | Admitting: Family Medicine

## 2019-10-23 VITALS — BP 113/75 | HR 75 | Temp 97.0°F | Ht 63.0 in | Wt 182.0 lb

## 2019-10-23 DIAGNOSIS — M25511 Pain in right shoulder: Secondary | ICD-10-CM

## 2019-10-23 DIAGNOSIS — S46001D Unspecified injury of muscle(s) and tendon(s) of the rotator cuff of right shoulder, subsequent encounter: Secondary | ICD-10-CM

## 2019-10-23 NOTE — Progress Notes (Signed)
BP 113/75    Pulse 75    Temp (!) 97 F (36.1 C)    Ht 5\' 3"  (1.6 m)    Wt 182 lb (82.6 kg)    LMP 04/14/2012    SpO2 98%    BMI 32.24 kg/m    Subjective:   Patient ID: Brianna [name], female    DOB: 1960/05/22, 59 y.o.   MRN: [id number]  HPI: Brianna [name] is a 59 y.o. female presenting on 10/23/2019 for Shoulder Pain (right shoulder. H/O injections)   HPI Patient is coming in for continued right shoulder pain, hurts anteriorly and posteriorly and internally with range of motion.  It hurts her at night and keeps her up sometimes.  She had a systemic corticosteroid injection a few weeks ago and it did not show any improvement.  She is coming in today to try to do the shoulder injection itself, we also discussed physical therapy she wants to hold off on that.  She denies any numbness or weakness or loss of grip strength in that arm.  Relevant past medical, surgical, family and social history reviewed and updated as indicated. Interim medical history since our last visit reviewed. Allergies and medications reviewed and updated.  Review of Systems  Constitutional: Negative for chills and fever.  Eyes: Negative for visual disturbance.  Respiratory: Negative for chest tightness and shortness of breath.   Cardiovascular: Negative for chest pain and leg swelling.  Musculoskeletal: Positive for arthralgias. Negative for joint swelling.  Skin: Negative for color change and rash.  All other systems reviewed and are negative.   Per HPI unless specifically indicated above      Objective:   BP 113/75    Pulse 75    Temp (!) 97 F (36.1 C)    Ht 5\' 3"  (1.6 m)    Wt 182 lb (82.6 kg)    LMP 04/14/2012    SpO2 98%    BMI 32.24 kg/m   Wt Readings from Last 3 Encounters:  10/23/19 182 lb (82.6 kg)  07/19/19 173 lb (78.5 kg)  04/21/18 169 lb (76.7 kg)    Physical Exam Vitals and nursing note reviewed.  Constitutional:      General: She is not in acute distress.    Appearance:  She is well-developed. She is not diaphoretic.  Eyes:     Conjunctiva/sclera: Conjunctivae normal.  Musculoskeletal:        General: Tenderness (Pain with range of motion of that shoulder both anteriorly and in abduction and in extension and internal and external rotation.) present. Normal range of motion.  Skin:    General: Skin is warm and dry.     Findings: No rash.  Neurological:     Mental Status: She is alert and oriented to person, place, and time.     Coordination: Coordination normal.  Psychiatric:        Behavior: Behavior normal.    Right subacromial injection: Consent form signed. Risk factors of bleeding and infection discussed with patient and patient is agreeable towards injection. Patient prepped with Betadine.  Posterior approach towards injection used. Injected 80 mg of Depo-Medrol and 1 mL of 2% lidocaine. Patient tolerated procedure well and no side effects from noted. Minimal to no bleeding. Simple bandage applied after.    Assessment & Plan:   Problem List Items Addressed This Visit    None    Visit Diagnoses    Injury of right rotator cuff, subsequent encounter    -  Primary       Follow up plan: Return if symptoms worsen or fail to improve.  Counseling provided for all of the vaccine components No orders of the defined types were placed in this encounter.   Caryl Pina, MD [facility] 10/23/2019, 10:31 AM

## 2019-11-14 ENCOUNTER — Ambulatory Visit: Payer: Medicare Other | Admitting: Family Medicine

## 2019-12-19 ENCOUNTER — Other Ambulatory Visit: Payer: Self-pay

## 2019-12-19 ENCOUNTER — Ambulatory Visit (INDEPENDENT_AMBULATORY_CARE_PROVIDER_SITE_OTHER): Payer: Medicare Other | Admitting: Internal Medicine

## 2019-12-19 ENCOUNTER — Encounter: Payer: Self-pay | Admitting: Internal Medicine

## 2019-12-19 ENCOUNTER — Telehealth: Payer: Self-pay | Admitting: *Deleted

## 2019-12-19 ENCOUNTER — Encounter: Payer: Self-pay | Admitting: *Deleted

## 2019-12-19 VITALS — BP 124/80 | HR 82 | Temp 97.1°F | Ht 63.0 in | Wt 183.8 lb

## 2019-12-19 DIAGNOSIS — R1319 Other dysphagia: Secondary | ICD-10-CM | POA: Diagnosis not present

## 2019-12-19 DIAGNOSIS — R1011 Right upper quadrant pain: Secondary | ICD-10-CM | POA: Diagnosis not present

## 2019-12-19 DIAGNOSIS — Z79899 Other long term (current) drug therapy: Secondary | ICD-10-CM | POA: Diagnosis not present

## 2019-12-19 DIAGNOSIS — K219 Gastro-esophageal reflux disease without esophagitis: Secondary | ICD-10-CM | POA: Diagnosis not present

## 2019-12-19 MED ORDER — PANTOPRAZOLE SODIUM 40 MG PO TBEC: 40.0000 mg | DELAYED_RELEASE_TABLET | Freq: Two times a day (BID) | ORAL | 3 refills | Status: DC

## 2019-12-19 NOTE — Patient Instructions (Signed)
Stop Dexilant; begin Protonix 40 mg twice daily (before breakfast and supper) Disp 60 with 3 refills  Labs now including CBC, lipase and hepatic function profile  Schedule and EGD with possible esophageal dilation (dysphagia, GERD and RUQ abdominal pain) Propofol ASA 2  Further recommendations to follow

## 2019-12-19 NOTE — Progress Notes (Signed)
Primary Care Physician:  Dettinger, Fransisca Kaufmann, MD Primary Gastroenterologist:  Dr. Gala Romney  Pre-Procedure History & Physical: HPI:  Brianna [name] is a 59 y.o. female here for evaluation of right upper quadrant abdominal pain, worsening reflux symptoms she describes as heartburn and insidiously progressive esophageal dysphagia of 2 months duration.  Patient has a long history of GERD.  Mild reflux esophagitis and a noncritical Schatzki's ring seen on 2015 EGD.  Historically, her GERD symptoms have been well controlled on Dexilant 60 mg daily.  It is noted that she has gained 34 pounds since 2017.  Right upper quadrant abdominal pain may occur after eating and it may come at other times as well.  Get some relief with consumption of a carbonated cola-but not always.  She takes an Amitiza couple of times monthly to facilitate bowel function.  Otherwise, she states she has 1-2 bowel movements daily.  She denies any melena or rectal bleeding.  Episode of biliary pancreatitis a few years ago for which she ultimately underwent cholecystectomy.  Current symptoms are not at all reminiscent of that episode per patient report.  Does not use NSAIDs or alcohol.  Colonoscopy 2015 demonstrated diverticulosis.  Past Medical History:  Diagnosis Date  . Anemia   . Anxiety   . Arthritis   . Chronic back pain   . Chronic neck pain   . Chronic right hip pain since 09/2012  . DDD (degenerative disc disease), cervical   . Depression   . Diverticulitis   . Fibromyalgia   . GERD (gastroesophageal reflux disease)   . Headache   . Hypertension   . Pancreatitis     Past Surgical History:  Procedure Laterality Date  . CESAREAN SECTION    . CHOLECYSTECTOMY N/A 08/16/2014   Procedure: LAPAROSCOPIC CHOLECYSTECTOMY WITH CHOLANGIOGRAM;  Surgeon: Sherri Rad, MD;  Location: [facility] ORS;  Service: General;  Laterality: N/A;  . COLONOSCOPY N/A 08/22/2013   [id number] diverticulosis paper hemtochezia from anorectal  irritation  . ESOPHAGOGASTRODUODENOSCOPY N/A 08/22/2013   [id number] reflux esophagitis/noncritical schatzki's ring and hiatial hernia  . OOPHORECTOMY      Prior to Admission medications   Medication Sig Start Date End Date Taking? Authorizing Provider  atorvastatin (LIPITOR) 20 MG tablet Take 1 tablet (20 mg total) by mouth daily. 07/26/19  Yes Dettinger, Fransisca Kaufmann, MD  citalopram (CELEXA) 40 MG tablet Take 1 tablet (40 mg total) by mouth daily. 07/19/19  Yes Dettinger, Fransisca Kaufmann, MD  dexlansoprazole (DEXILANT) 60 MG capsule Take 1 capsule (60 mg total) by mouth daily. 07/19/19  Yes Dettinger, Fransisca Kaufmann, MD  fluticasone (FLONASE) 50 MCG/ACT nasal spray Place 1 spray into both nostrils 2 (two) times daily as needed for allergies or rhinitis. 07/01/16  Yes Dettinger, Fransisca Kaufmann, MD  hydrochlorothiazide (MICROZIDE) 12.5 MG capsule Take 1 capsule (12.5 mg total) by mouth daily. 07/19/19  Yes Dettinger, Fransisca Kaufmann, MD  loratadine (CLARITIN) 10 MG tablet Take 1 tablet (10 mg total) by mouth daily. 07/19/19  Yes Dettinger, Fransisca Kaufmann, MD  lubiprostone (AMITIZA) 24 MCG capsule Take 1 capsule (24 mcg total) by mouth 2 (two) times daily as needed for constipation. 01/09/15  Yes Dettinger, Fransisca Kaufmann, MD  pregabalin (LYRICA) 100 MG capsule Take 1 capsule (100 mg total) by mouth 2 (two) times daily. 07/19/19  Yes Dettinger, Fransisca Kaufmann, MD    Allergies as of 12/19/2019 - Review Complete 12/19/2019  Allergen Reaction Noted  . Tylenol with codeine #3 [acetaminophen-codeine] Other (See Comments) 05/24/2014  Family History  Problem Relation Age of Onset  . Ovarian cancer Mother   . Gallbladder disease Son   . Colon cancer Neg Hx     Social History   Socioeconomic History  . Marital status: Widowed    Spouse name: Not on file  . Number of children: Not on file  . Years of education: Not on file  . Highest education level: Not on file  Occupational History    Comment: hasn't worked since October 2014. Filing for  disability  Tobacco Use  . Smoking status: Former Smoker    Packs/day: 0.50    Years: 2.00    Pack years: 1.00    Types: Cigarettes    Quit date: 01/06/2007    Years since quitting: 12.9  . Smokeless tobacco: Never Used  Vaping Use  . Vaping Use: Never used  Substance and Sexual Activity  . Alcohol use: No  . Drug use: No  . Sexual activity: Never  Other Topics Concern  . Not on file  Social History Narrative  . Not on file   Social Determinants of Health   Financial Resource Strain: Not on file  Food Insecurity: Not on file  Transportation Needs: Not on file  Physical Activity: Not on file  Stress: Not on file  Social Connections: Not on file  Intimate Partner Violence: Not on file    Review of Systems: See HPI, otherwise negative ROS  Physical Exam: BP 124/80   Pulse 82   Temp (!) 97.1 F (36.2 C) (Temporal)   Ht 5\' 3"  (1.6 m)   Wt 183 lb 12.8 oz (83.4 kg)   LMP 04/14/2012   BMI 32.56 kg/m  General:   Alert,   pleasant and cooperative in NAD Neck:  Supple; no masses or thyromegaly. No significant cervical adenopathy. Lungs:  Clear throughout to auscultation.   No wheezes, crackles, or rhonchi. No acute distress. Heart:  Regular rate and rhythm; no murmurs, clicks, rubs,  or gallops. Abdomen: Non-distended, normal bowel sounds.  Soft and nontender without appreciable mass or hepatosplenomegaly.  Pulses:  Normal pulses noted. Extremities:  Without clubbing or edema. Rectal: No external lesions.  Good sphincter tone.  Scant brown stool in the rectal vault is Hemoccult negative.   Impression/Plan: 58 year old lady with longstanding GERD/history of erosive reflux esophagitis presents with frequent breakthrough symptoms while on Dexilant in the setting of significant weight gain.  Episodic right upper quadrant abdominal pain not clearly related to a meal or bowel function.  Gallbladder is out. New onset dysphagia in the setting of a known noncritical Schatzki's  ring.  Constipation does not appear to be an issue at this time.  I suspect her symptoms are chiefly related to GERD with significant weight gain undermining efficacy of her current acid suppression regimen.  Dysphagia demands further evaluation.  Recommendations:  Stop Dexilant; begin Protonix 40 mg twice daily (before breakfast and supper) Disp 60 with 3 refills  Labs now including CBC, lipase and hepatic function profile  Schedule and EGD with possible esophageal dilation (dysphagia, GERD and RUQ abdominal pain) Propofol ASA 2  Further recommendations to follow   Notice: This dictation was prepared with Dragon dictation along with smaller phrase technology. Any transcriptional errors that result from this process are unintentional and may not be corrected upon review.

## 2019-12-19 NOTE — Telephone Encounter (Signed)
PA for EGD/ED as approved via [place]. Auth# [id number] DOS 01/04/2020-01/05/2020

## 2019-12-19 NOTE — H&P (View-Only) (Signed)
Primary Care Physician:  Dettinger, Fransisca Kaufmann, MD Primary Gastroenterologist:  Dr. Gala Romney  Pre-Procedure History & Physical: HPI:  Brianna [name] is a 59 y.o. female here for evaluation of right upper quadrant abdominal pain, worsening reflux symptoms she describes as heartburn and insidiously progressive esophageal dysphagia of 2 months duration.  Patient has a long history of GERD.  Mild reflux esophagitis and a noncritical Schatzki's ring seen on 2015 EGD.  Historically, her GERD symptoms have been well controlled on Dexilant 60 mg daily.  It is noted that she has gained 34 pounds since 2017.  Right upper quadrant abdominal pain may occur after eating and it may come at other times as well.  Get some relief with consumption of a carbonated cola-but not always.  She takes an Amitiza couple of times monthly to facilitate bowel function.  Otherwise, she states she has 1-2 bowel movements daily.  She denies any melena or rectal bleeding.  Episode of biliary pancreatitis a few years ago for which she ultimately underwent cholecystectomy.  Current symptoms are not at all reminiscent of that episode per patient report.  Does not use NSAIDs or alcohol.  Colonoscopy 2015 demonstrated diverticulosis.  Past Medical History:  Diagnosis Date   Anemia    Anxiety    Arthritis    Chronic back pain    Chronic neck pain    Chronic right hip pain since 09/2012   DDD (degenerative disc disease), cervical    Depression    Diverticulitis    Fibromyalgia    GERD (gastroesophageal reflux disease)    Headache    Hypertension    Pancreatitis     Past Surgical History:  Procedure Laterality Date   CESAREAN SECTION     CHOLECYSTECTOMY N/A 08/16/2014   Procedure: LAPAROSCOPIC CHOLECYSTECTOMY WITH CHOLANGIOGRAM;  Surgeon: Sherri Rad, MD;  Location: [facility] ORS;  Service: General;  Laterality: N/A;   COLONOSCOPY N/A 08/22/2013   HFW:YOVZCHY diverticulosis paper hemtochezia from anorectal  irritation   ESOPHAGOGASTRODUODENOSCOPY N/A 08/22/2013   IFO:YDXAJOI reflux esophagitis/noncritical schatzki's ring and hiatial hernia   OOPHORECTOMY      Prior to Admission medications   Medication Sig Start Date End Date Taking? Authorizing Provider  atorvastatin (LIPITOR) 20 MG tablet Take 1 tablet (20 mg total) by mouth daily. 07/26/19  Yes Dettinger, Fransisca Kaufmann, MD  citalopram (CELEXA) 40 MG tablet Take 1 tablet (40 mg total) by mouth daily. 07/19/19  Yes Dettinger, Fransisca Kaufmann, MD  dexlansoprazole (DEXILANT) 60 MG capsule Take 1 capsule (60 mg total) by mouth daily. 07/19/19  Yes Dettinger, Fransisca Kaufmann, MD  fluticasone (FLONASE) 50 MCG/ACT nasal spray Place 1 spray into both nostrils 2 (two) times daily as needed for allergies or rhinitis. 07/01/16  Yes Dettinger, Fransisca Kaufmann, MD  hydrochlorothiazide (MICROZIDE) 12.5 MG capsule Take 1 capsule (12.5 mg total) by mouth daily. 07/19/19  Yes Dettinger, Fransisca Kaufmann, MD  loratadine (CLARITIN) 10 MG tablet Take 1 tablet (10 mg total) by mouth daily. 07/19/19  Yes Dettinger, Fransisca Kaufmann, MD  lubiprostone (AMITIZA) 24 MCG capsule Take 1 capsule (24 mcg total) by mouth 2 (two) times daily as needed for constipation. 01/09/15  Yes Dettinger, Fransisca Kaufmann, MD  pregabalin (LYRICA) 100 MG capsule Take 1 capsule (100 mg total) by mouth 2 (two) times daily. 07/19/19  Yes Dettinger, Fransisca Kaufmann, MD    Allergies as of 12/19/2019 - Review Complete 12/19/2019  Allergen Reaction Noted   Tylenol with codeine #3 [acetaminophen-codeine] Other (See Comments) 05/24/2014  Family History  Problem Relation Age of Onset   Ovarian cancer Mother    Gallbladder disease Son    Colon cancer Neg Hx     Social History   Socioeconomic History   Marital status: Widowed    Spouse name: Not on file   Number of children: Not on file   Years of education: Not on file   Highest education level: Not on file  Occupational History    Comment: hasn't worked since October 2014. Filing for  disability  Tobacco Use   Smoking status: Former Smoker    Packs/day: 0.50    Years: 2.00    Pack years: 1.00    Types: Cigarettes    Quit date: 01/06/2007    Years since quitting: 12.9   Smokeless tobacco: Never Used  Scientific laboratory technician Use: Never used  Substance and Sexual Activity   Alcohol use: No   Drug use: No   Sexual activity: Never  Other Topics Concern   Not on file  Social History Narrative   Not on file   Social Determinants of Health   Financial Resource Strain: Not on file  Food Insecurity: Not on file  Transportation Needs: Not on file  Physical Activity: Not on file  Stress: Not on file  Social Connections: Not on file  Intimate Partner Violence: Not on file    Review of Systems: See HPI, otherwise negative ROS  Physical Exam: BP 124/80    Pulse 82    Temp (!) 97.1 F (36.2 C) (Temporal)    Ht 5\' 3"  (1.6 m)    Wt 183 lb 12.8 oz (83.4 kg)    LMP 04/14/2012    BMI 32.56 kg/m  General:   Alert,   pleasant and cooperative in NAD Neck:  Supple; no masses or thyromegaly. No significant cervical adenopathy. Lungs:  Clear throughout to auscultation.   No wheezes, crackles, or rhonchi. No acute distress. Heart:  Regular rate and rhythm; no murmurs, clicks, rubs,  or gallops. Abdomen: Non-distended, normal bowel sounds.  Soft and nontender without appreciable mass or hepatosplenomegaly.  Pulses:  Normal pulses noted. Extremities:  Without clubbing or edema. Rectal: No external lesions.  Good sphincter tone.  Scant brown stool in the rectal vault is Hemoccult negative.   Impression/Plan: 59 year old lady with longstanding GERD/history of erosive reflux esophagitis presents with frequent breakthrough symptoms while on Dexilant in the setting of significant weight gain.  Episodic right upper quadrant abdominal pain not clearly related to a meal or bowel function.  Gallbladder is out. New onset dysphagia in the setting of a known noncritical Schatzki's  ring.  Constipation does not appear to be an issue at this time.  I suspect her symptoms are chiefly related to GERD with significant weight gain undermining efficacy of her current acid suppression regimen.  Dysphagia demands further evaluation.  Recommendations:  Stop Dexilant; begin Protonix 40 mg twice daily (before breakfast and supper) Disp 60 with 3 refills  Labs now including CBC, lipase and hepatic function profile  Schedule and EGD with possible esophageal dilation (dysphagia, GERD and RUQ abdominal pain) Propofol ASA 2  Further recommendations to follow   Notice: This dictation was prepared with Dragon dictation along with smaller phrase technology. Any transcriptional errors that result from this process are unintentional and may not be corrected upon review.

## 2019-12-19 NOTE — Telephone Encounter (Signed)
covid test scheduled for 12/29 at 10:00am. Called pt and left detailed message on VM

## 2019-12-20 LAB — CBC WITH DIFFERENTIAL/PLATELET
Absolute Monocytes: 689 cells/uL (ref 200–950)
Basophils Absolute: 67 cells/uL (ref 0–200)
Basophils Relative: 0.8 %
Eosinophils Absolute: 244 cells/uL (ref 15–500)
Eosinophils Relative: 2.9 %
HCT: 40.3 % (ref 35.0–45.0)
Hemoglobin: 13.7 g/dL (ref 11.7–15.5)
Lymphs Abs: 3251 cells/uL (ref 850–3900)
MCH: 29 pg (ref 27.0–33.0)
MCHC: 34 g/dL (ref 32.0–36.0)
MCV: 85.4 fL (ref 80.0–100.0)
MPV: 9.9 fL (ref 7.5–12.5)
Monocytes Relative: 8.2 %
Neutro Abs: 4150 cells/uL (ref 1500–7800)
Neutrophils Relative %: 49.4 %
Platelets: 358 10*3/uL (ref 140–400)
RBC: 4.72 10*6/uL (ref 3.80–5.10)
RDW: 12.4 % (ref 11.0–15.0)
Total Lymphocyte: 38.7 %
WBC: 8.4 10*3/uL (ref 3.8–10.8)

## 2019-12-20 LAB — HEPATIC FUNCTION PANEL
AG Ratio: 1.5 (calc) (ref 1.0–2.5)
ALT: 32 U/L — ABNORMAL HIGH (ref 6–29)
AST: 28 U/L (ref 10–35)
Albumin: 4 g/dL (ref 3.6–5.1)
Alkaline phosphatase (APISO): 104 U/L (ref 37–153)
Bilirubin, Direct: 0.1 mg/dL (ref 0.0–0.2)
Globulin: 2.6 g/dL (calc) (ref 1.9–3.7)
Indirect Bilirubin: 0.3 mg/dL (calc) (ref 0.2–1.2)
Total Bilirubin: 0.4 mg/dL (ref 0.2–1.2)
Total Protein: 6.6 g/dL (ref 6.1–8.1)

## 2019-12-20 LAB — LIPASE: Lipase: 19 U/L (ref 7–60)

## 2020-01-03 ENCOUNTER — Other Ambulatory Visit (HOSPITAL_COMMUNITY): Payer: Medicare Other

## 2020-01-03 ENCOUNTER — Other Ambulatory Visit (HOSPITAL_COMMUNITY)
Admission: RE | Admit: 2020-01-03 | Discharge: 2020-01-03 | Disposition: A | Discharge status: Home / Self Care | Payer: Medicare Other | Source: Ambulatory Visit | Attending: Internal Medicine | Admitting: Internal Medicine

## 2020-01-03 ENCOUNTER — Other Ambulatory Visit: Payer: Self-pay

## 2020-01-03 DIAGNOSIS — Z20822 Contact with and (suspected) exposure to covid-19: Secondary | ICD-10-CM | POA: Insufficient documentation

## 2020-01-03 DIAGNOSIS — Z01812 Encounter for preprocedural laboratory examination: Secondary | ICD-10-CM | POA: Insufficient documentation

## 2020-01-03 LAB — BASIC METABOLIC PANEL
Anion gap: 10 (ref 5–15)
BUN: 12 mg/dL (ref 6–20)
CO2: 25 mmol/L (ref 22–32)
Calcium: 9.1 mg/dL (ref 8.9–10.3)
Chloride: 102 mmol/L (ref 98–111)
Creatinine, Ser: 1.05 mg/dL — ABNORMAL HIGH (ref 0.44–1.00)
GFR, Estimated: >60 mL/min (ref >60)
Glucose, Bld: 136 mg/dL — ABNORMAL HIGH (ref 70–99)
Potassium: 3.7 mmol/L (ref 3.5–5.1)
Sodium: 137 mmol/L (ref 135–145)

## 2020-01-04 ENCOUNTER — Other Ambulatory Visit: Payer: Self-pay

## 2020-01-04 ENCOUNTER — Ambulatory Visit (HOSPITAL_COMMUNITY): Payer: Medicare Other | Admitting: Anesthesiology

## 2020-01-04 ENCOUNTER — Encounter (HOSPITAL_COMMUNITY): Payer: Self-pay | Admitting: Internal Medicine

## 2020-01-04 ENCOUNTER — Encounter (HOSPITAL_COMMUNITY)
Admission: RE | Disposition: A | Discharge status: Home / Self Care | Payer: Self-pay | Source: Home / Self Care | Attending: Internal Medicine

## 2020-01-04 ENCOUNTER — Ambulatory Visit (HOSPITAL_COMMUNITY)
Admission: RE | Admit: 2020-01-04 | Discharge: 2020-01-04 | Disposition: A | Discharge status: Home / Self Care | Payer: Medicare Other | Attending: Internal Medicine | Admitting: Internal Medicine

## 2020-01-04 DIAGNOSIS — K2100 Gastro-esophageal reflux disease with esophagitis, without bleeding: Secondary | ICD-10-CM | POA: Insufficient documentation

## 2020-01-04 DIAGNOSIS — R1310 Dysphagia, unspecified: Secondary | ICD-10-CM

## 2020-01-04 DIAGNOSIS — Z885 Allergy status to narcotic agent status: Secondary | ICD-10-CM | POA: Diagnosis not present

## 2020-01-04 DIAGNOSIS — R12 Heartburn: Secondary | ICD-10-CM | POA: Diagnosis not present

## 2020-01-04 DIAGNOSIS — Z79899 Other long term (current) drug therapy: Secondary | ICD-10-CM | POA: Insufficient documentation

## 2020-01-04 DIAGNOSIS — K449 Diaphragmatic hernia without obstruction or gangrene: Secondary | ICD-10-CM | POA: Diagnosis not present

## 2020-01-04 DIAGNOSIS — R1011 Right upper quadrant pain: Secondary | ICD-10-CM | POA: Insufficient documentation

## 2020-01-04 DIAGNOSIS — Z87891 Personal history of nicotine dependence: Secondary | ICD-10-CM | POA: Diagnosis not present

## 2020-01-04 DIAGNOSIS — K209 Esophagitis, unspecified without bleeding: Secondary | ICD-10-CM | POA: Diagnosis not present

## 2020-01-04 DIAGNOSIS — R1314 Dysphagia, pharyngoesophageal phase: Secondary | ICD-10-CM | POA: Diagnosis not present

## 2020-01-04 HISTORY — PX: MALONEY DILATION: SHX5535

## 2020-01-04 HISTORY — PX: ESOPHAGOGASTRODUODENOSCOPY (EGD) WITH PROPOFOL: SHX5813

## 2020-01-04 LAB — SARS CORONAVIRUS 2 (TAT 6-24 HRS): SARS Coronavirus 2: NEGATIVE

## 2020-01-04 SURGERY — ESOPHAGOGASTRODUODENOSCOPY (EGD) WITH PROPOFOL
Anesthesia: General

## 2020-01-04 MED ORDER — PROPOFOL 10 MG/ML IV BOLUS
INTRAVENOUS | Status: DC | PRN
  Administered 2020-01-04: 40 mg via INTRAVENOUS
  Administered 2020-01-04: 80 mg via INTRAVENOUS

## 2020-01-04 MED ORDER — GLYCOPYRROLATE 0.2 MG/ML IJ SOLN: 0.2000 mg | Freq: Once | INTRAMUSCULAR | Status: AC

## 2020-01-04 MED ORDER — GLYCOPYRROLATE 0.2 MG/ML IJ SOLN
INTRAMUSCULAR | Status: AC
  Administered 2020-01-04: 0.2 mg via INTRAVENOUS
  Filled 2020-01-04: qty 1

## 2020-01-04 MED ORDER — STERILE WATER FOR IRRIGATION IR SOLN
Status: DC | PRN
  Administered 2020-01-04: 100 mL

## 2020-01-04 MED ORDER — LIDOCAINE HCL (CARDIAC) PF 100 MG/5ML IV SOSY
PREFILLED_SYRINGE | INTRAVENOUS | Status: DC | PRN
  Administered 2020-01-04: 50 mg via INTRAVENOUS

## 2020-01-04 MED ORDER — PROPOFOL 500 MG/50ML IV EMUL
INTRAVENOUS | Status: DC | PRN
  Administered 2020-01-04: 150 ug/kg/min via INTRAVENOUS

## 2020-01-04 MED ORDER — LACTATED RINGERS IV SOLN: INTRAVENOUS | Status: DC

## 2020-01-04 MED ORDER — LIDOCAINE VISCOUS HCL 2 % MT SOLN: 15.0000 mL | Freq: Once | OROMUCOSAL | Status: AC

## 2020-01-04 MED ORDER — LIDOCAINE VISCOUS HCL 2 % MT SOLN
OROMUCOSAL | Status: AC
  Administered 2020-01-04: 15 mL via OROMUCOSAL
  Filled 2020-01-04: qty 15

## 2020-01-04 NOTE — Discharge Instructions (Signed)
EGD Discharge instructions Please read the instructions outlined below and refer to this sheet in the next few weeks. These discharge instructions provide you with general information on caring for yourself after you leave the hospital. Your doctor may also give you specific instructions. While your treatment has been planned according to the most current medical practices available, unavoidable complications occasionally occur. If you have any problems or questions after discharge, please call your doctor. ACTIVITY  You may resume your regular activity but move at a slower pace for the next 24 hours.   Take frequent rest periods for the next 24 hours.   Walking will help expel (get rid of) the air and reduce the bloated feeling in your abdomen.   No driving for 24 hours (because of the anesthesia (medicine) used during the test).   You may shower.   Do not sign any important legal documents or operate any machinery for 24 hours (because of the anesthesia used during the test).  NUTRITION  Drink plenty of fluids.   You may resume your normal diet.   Begin with a light meal and progress to your normal diet.   Avoid alcoholic beverages for 24 hours or as instructed by your caregiver.  MEDICATIONS  You may resume your normal medications unless your caregiver tells you otherwise.  WHAT YOU CAN EXPECT TODAY  You may experience abdominal discomfort such as a feeling of fullness or "gas" pains.  FOLLOW-UP  Your doctor will discuss the results of your test with you.  SEEK IMMEDIATE MEDICAL ATTENTION IF ANY OF THE FOLLOWING OCCUR:  Excessive nausea (feeling sick to your stomach) and/or vomiting.   Severe abdominal pain and distention (swelling).   Trouble swallowing.   Temperature over 101 F (37.8 C).   Rectal bleeding or vomiting of blood.    Your esophagus was dilated today.  You have acid irritation in your esophagus.  You have a moderate size hiatal hernia  Continue  Protonix 40 mg twice daily (before breakfast and supper)  Add Pepcid 40 mg orally at bedtime new prescription provided  Weight loss and antireflux diet recommended (information provided)  Office visit with [place] in 2 months.  At patient request, I called Oneida Alar at [phone] -reviewed results   Gastroesophageal Reflux Disease, Adult Gastroesophageal reflux (GER) happens when acid from the stomach flows up into the tube that connects the mouth and the stomach (esophagus). Normally, food travels down the esophagus and stays in the stomach to be digested. With GER, food and stomach acid sometimes move back up into the esophagus. You may have a disease called gastroesophageal reflux disease (GERD) if the reflux:  Happens often.  Causes frequent or very bad symptoms.  Causes problems such as damage to the esophagus. When this happens, the esophagus becomes sore and swollen (inflamed). Over time, GERD can make small holes (ulcers) in the lining of the esophagus. What are the causes? This condition is caused by a problem with the muscle between the esophagus and the stomach. When this muscle is weak or not normal, it does not close properly to keep food and acid from coming back up from the stomach. The muscle can be weak because of:  Tobacco use.  Pregnancy.  Having a certain type of hernia (hiatal hernia).  Alcohol use.  Certain foods and drinks, such as coffee, chocolate, onions, and peppermint. What increases the risk? You are more likely to develop this condition if you:  Are overweight.  Have a disease that  affects your connective tissue.  Use NSAID medicines. What are the signs or symptoms? Symptoms of this condition include:  Heartburn.  Difficult or painful swallowing.  The feeling of having a lump in the throat.  A bitter taste in the mouth.  Bad breath.  Having a lot of saliva.  Having an upset or bloated stomach.  Belching.  Chest pain. Different  conditions can cause chest pain. Make sure you see your doctor if you have chest pain.  Shortness of breath or noisy breathing (wheezing).  Ongoing (chronic) cough or a cough at night.  Wearing away of the surface of teeth (tooth enamel).  Weight loss. How is this treated? Treatment will depend on how bad your symptoms are. Your doctor may suggest:  Changes to your diet.  Medicine.  Surgery. Follow these instructions at home: Eating and drinking   Follow a diet as told by your doctor. You may need to avoid foods and drinks such as: ? Coffee and tea (with or without caffeine). ? Drinks that contain alcohol. ? Energy drinks and sports drinks. ? Bubbly (carbonated) drinks or sodas. ? Chocolate and cocoa. ? Peppermint and mint flavorings. ? Garlic and onions. ? Horseradish. ? Spicy and acidic foods. These include peppers, chili powder, curry powder, vinegar, hot sauces, and BBQ sauce. ? Citrus fruit juices and citrus fruits, such as oranges, lemons, and limes. ? Tomato-based foods. These include red sauce, chili, salsa, and pizza with red sauce. ? Fried and fatty foods. These include donuts, french fries, potato chips, and high-fat dressings. ? High-fat meats. These include hot dogs, rib eye steak, sausage, ham, and bacon. ? High-fat dairy items, such as whole milk, butter, and cream cheese.  Eat small meals often. Avoid eating large meals.  Avoid drinking large amounts of liquid with your meals.  Avoid eating meals during the 2-3 hours before bedtime.  Avoid lying down right after you eat.  Do not exercise right after you eat. Lifestyle   Do not use any products that contain nicotine or tobacco. These include cigarettes, e-cigarettes, and chewing tobacco. If you need help quitting, ask your doctor.  Try to lower your stress. If you need help doing this, ask your doctor.  If you are overweight, lose an amount of weight that is healthy for you. Ask your doctor about a  safe weight loss goal. General instructions  Pay attention to any changes in your symptoms.  Take over-the-counter and prescription medicines only as told by your doctor. Do not take aspirin, ibuprofen, or other NSAIDs unless your doctor says it is okay.  Wear loose clothes. Do not wear anything tight around your waist.  Raise (elevate) the head of your bed about 6 inches (15 cm).  Avoid bending over if this makes your symptoms worse.  Keep all follow-up visits as told by your doctor. This is important. Contact a doctor if:  You have new symptoms.  You lose weight and you do not know why.  You have trouble swallowing or it hurts to swallow.  You have wheezing or a cough that keeps happening.  Your symptoms do not get better with treatment.  You have a hoarse voice. Get help right away if:  You have pain in your arms, neck, jaw, teeth, or back.  You feel sweaty, dizzy, or light-headed.  You have chest pain or shortness of breath.  You throw up (vomit) and your throw-up looks like blood or coffee grounds.  You pass out (faint).  Your poop (  stool) is bloody or black.  You cannot swallow, drink, or eat. Summary  If a person has gastroesophageal reflux disease (GERD), food and stomach acid move back up into the esophagus and cause symptoms or problems such as damage to the esophagus.  Treatment will depend on how bad your symptoms are.  Follow a diet as told by your doctor.  Take all medicines only as told by your doctor. This information is not intended to replace advice given to you by your health care provider. Make sure you discuss any questions you have with your health care provider. Document Revised: 06/30/2017 Document Reviewed: 06/30/2017 Elsevier Patient Education  [place].

## 2020-01-04 NOTE — Op Note (Signed)
[facility] Patient Name: Brianna [name] Procedure Date: 01/04/2020 10:58 AM MRN: [id number] Date of Birth: 07/24/1960 Attending MD: Gennette Pac , MD CSN: [id number] Age: 59 Admit Type: Outpatient Procedure:                Upper GI endoscopy Indications:              Dysphagia, Heartburn Providers:                Gennette Pac, MD, Sheran Fava,                            Pandora Leiter, Technician Referring MD:              Medicines:                Propofol per Anesthesia Complications:            No immediate complications. Estimated Blood Loss:     Estimated blood loss was minimal. Procedure:                Pre-Anesthesia Assessment:                           - Prior to the procedure, a History and Physical                            was performed, and patient medications and                            allergies were reviewed. The patient's tolerance of                            previous anesthesia was also reviewed. The risks                            and benefits of the procedure and the sedation                            options and risks were discussed with the patient.                            All questions were answered, and informed consent                            was obtained. Prior Anticoagulants: The patient has                            taken no previous anticoagulant or antiplatelet                            agents. ASA Grade Assessment: II - A patient with                            mild systemic disease. After reviewing the risks  and benefits, the patient was deemed in                            satisfactory condition to undergo the procedure.                           After obtaining informed consent, the endoscope was                            passed under direct vision. Throughout the                            procedure, the patient's blood pressure, pulse, and                            oxygen  saturations were monitored continuously. The                            GIF-H190 ([id number]) scope was introduced through the                            mouth, and advanced to the second part of duodenum.                            The upper GI endoscopy was accomplished without                            difficulty. The patient tolerated the procedure                            well. Scope In: 11:09:34 AM Scope Out: 11:16:09 AM Total Procedure Duration: 0 hours 6 minutes 35 seconds  Findings:      Esophagitis was found. Skipped linear erosions coming up 5 cm from the       GE junction. No Barrett's epithelium seen. Esophagus was patent       throughout its course. In fact, GE junction patulous.      A medium-sized hiatal hernia was present.      The duodenal bulb and second portion of the duodenum were normal. The       scope was withdrawn. Dilation was performed with a Maloney dilator with       mild resistance at 54 Fr. The dilation site was examined and showed no       change. Estimated blood loss was minimal. Impression:               -Mild erosive reflux esophagitis. Dilated.                           - Medium-sized hiatal hernia.                           - Normal duodenal bulb and second portion of the                            duodenum. Moderate Sedation:  Moderate (conscious) sedation was personally administered by an       anesthesia professional. The following parameters were monitored: oxygen       saturation, heart rate, blood pressure, respiratory rate, EKG, adequacy       of pulmonary ventilation, and response to care. Recommendation:           - Patient has a contact number available for                            emergencies. The signs and symptoms of potential                            delayed complications were discussed with the                            patient. Return to normal activities tomorrow.                            Written discharge instructions  were provided to the                            patient.                           - Resume previous diet.                           - Continue present medications. Continue Protonix                            40 mg twice daily. Add Pepcid 40 mg orally at                            bedtime?"new prescription. Weight loss. Antireflux                            diet. Aloe up on slightly elevated ALT when she                            returns for follow-up.                           - Return to my office in 2 months. Procedure Code(s):        --- Professional ---                           [phone], Esophagogastroduodenoscopy, flexible,                            transoral; diagnostic, including collection of                            specimen(s) by brushing or washing, when performed                            (separate procedure)  43450, Dilation of esophagus, by unguided sound or                            bougie, single or multiple passes Diagnosis Code(s):        --- Professional ---                           K20.90, Esophagitis, unspecified without bleeding                           K44.9, Diaphragmatic hernia without obstruction or                            gangrene                           R13.10, Dysphagia, unspecified                           R12, Heartburn CPT copyright 2019 American Medical Association. All rights reserved. The codes documented in this report are preliminary and upon coder review may  be revised to meet current compliance requirements. Brianna [name]. [name], MD Gennette Pac, MD 01/04/2020 11:26:58 AM This report has been signed electronically. Number of Addenda: 0

## 2020-01-04 NOTE — Transfer of Care (Signed)
Immediate Anesthesia Transfer of Care Note  Patient: Brianna [name]  Procedure(s) Performed: ESOPHAGOGASTRODUODENOSCOPY (EGD) WITH PROPOFOL (N/A ) MALONEY DILATION (N/A )  Patient Location: PACU  Anesthesia Type:General  Level of Consciousness: awake and alert   Airway & Oxygen Therapy: Patient Spontanous Breathing  Post-op Assessment: Report given to RN and Post -op Vital signs reviewed and stable  Post vital signs: Reviewed and stable  Last Vitals:  Vitals Value Taken Time  BP 92/62   Temp 97.6   Pulse 88   Resp 10   SpO2 95     Last Pain:  Vitals:   01/04/20 1104  TempSrc:   PainSc: 0-No pain      Patients Stated Pain Goal: 7 (01/04/20 0951)  Complications: No complications documented.

## 2020-01-04 NOTE — Anesthesia Postprocedure Evaluation (Signed)
Anesthesia Post Note  Patient: Brianna [name]  Procedure(s) Performed: ESOPHAGOGASTRODUODENOSCOPY (EGD) WITH PROPOFOL (N/A ) MALONEY DILATION (N/A )  Patient location during evaluation: Phase II Anesthesia Type: General Level of consciousness: awake and alert and oriented Pain management: pain level controlled Vital Signs Assessment: post-procedure vital signs reviewed and stable Respiratory status: spontaneous breathing Cardiovascular status: blood pressure returned to baseline Postop Assessment: no apparent nausea or vomiting Anesthetic complications: no   No complications documented.   Last Vitals:  Vitals:   01/04/20 0951 01/04/20 1122  BP: 123/77 92/62  Pulse: 70 84  Resp: 10 11  Temp: 36.4 C 36.4 C  SpO2: 95% 95%    Last Pain:  Vitals:   01/04/20 1122  TempSrc: Axillary  PainSc: 0-No pain                 Lorin Glass

## 2020-01-04 NOTE — Anesthesia Preprocedure Evaluation (Signed)
Anesthesia Evaluation  Patient identified by MRN, date of birth, ID band Patient awake    Reviewed: Allergy & Precautions, H&P , NPO status , Patient's Chart, lab work & pertinent test results, reviewed documented beta blocker date and time   Airway Mallampati: II  TM Distance: >3 FB Neck ROM: full    Dental no notable dental hx.    Pulmonary neg pulmonary ROS, former smoker,    Pulmonary exam normal breath sounds clear to auscultation       Cardiovascular Exercise Tolerance: Good hypertension, negative cardio ROS   Rhythm:regular Rate:Normal     Neuro/Psych  Headaches, PSYCHIATRIC DISORDERS Anxiety Depression  Neuromuscular disease    GI/Hepatic Neg liver ROS, GERD  Medicated,  Endo/Other  negative endocrine ROS  Renal/GU negative Renal ROS  negative genitourinary   Musculoskeletal   Abdominal   Peds  Hematology  (+) Blood dyscrasia, anemia ,   Anesthesia Other Findings   Reproductive/Obstetrics negative OB ROS                             Anesthesia Physical Anesthesia Plan  ASA: II  Anesthesia Plan: General   Post-op Pain Management:    Induction:   PONV Risk Score and Plan: Propofol infusion  Airway Management Planned:   Additional Equipment:   Intra-op Plan:   Post-operative Plan:   Informed Consent: I have reviewed the patients History and Physical, chart, labs and discussed the procedure including the risks, benefits and alternatives for the proposed anesthesia with the patient or authorized representative who has indicated his/her understanding and acceptance.     Dental Advisory Given  Plan Discussed with: CRNA  Anesthesia Plan Comments:         Anesthesia Quick Evaluation

## 2020-01-04 NOTE — Interval H&P Note (Signed)
History and Physical Interval Note:  01/04/2020 10:57 AM  Brianna [name]  has presented today for surgery, with the diagnosis of dysphagia, gerd, RUQ abd pain.  The various methods of treatment have been discussed with the patient and family. After consideration of risks, benefits and other options for treatment, the patient has consented to  Procedure(s) with comments: ESOPHAGOGASTRODUODENOSCOPY (EGD) WITH PROPOFOL (N/A) - 11:00am MALONEY DILATION (N/A) as a surgical intervention.  The patient's history has been reviewed, patient examined, no change in status, stable for surgery.  I have reviewed the patient's chart and labs.  Questions were answered to the patient's satisfaction.     Lucion Dilger  No change.  Protonix twice daily may be a little better than Dexilant as she reports.  Recent labs look good except for an ALT of 32 EGD with possible esophageal dilation as feasible/appropriate today per plan. The risks, benefits, limitations, alternatives and imponderables have been reviewed with the patient. Potential for esophageal dilation, biopsy, etc. have also been reviewed.  Questions have been answered. All parties agreeable.

## 2020-01-10 ENCOUNTER — Encounter (HOSPITAL_COMMUNITY): Payer: Self-pay | Admitting: Internal Medicine

## 2020-01-19 ENCOUNTER — Ambulatory Visit (INDEPENDENT_AMBULATORY_CARE_PROVIDER_SITE_OTHER): Payer: Medicare Other | Admitting: Family Medicine

## 2020-01-19 ENCOUNTER — Encounter: Payer: Self-pay | Admitting: Family Medicine

## 2020-01-19 ENCOUNTER — Other Ambulatory Visit (HOSPITAL_COMMUNITY)
Admission: RE | Admit: 2020-01-19 | Discharge: 2020-01-19 | Disposition: A | Discharge status: Home / Self Care | Payer: Medicare Other | Source: Ambulatory Visit | Attending: Family Medicine | Admitting: Family Medicine

## 2020-01-19 ENCOUNTER — Other Ambulatory Visit: Payer: Self-pay

## 2020-01-19 VITALS — BP 109/78 | HR 76 | Ht 63.0 in | Wt 184.0 lb

## 2020-01-19 DIAGNOSIS — Z79891 Long term (current) use of opiate analgesic: Secondary | ICD-10-CM | POA: Diagnosis not present

## 2020-01-19 DIAGNOSIS — Z01419 Encounter for gynecological examination (general) (routine) without abnormal findings: Secondary | ICD-10-CM | POA: Diagnosis present

## 2020-01-19 DIAGNOSIS — I10 Essential (primary) hypertension: Secondary | ICD-10-CM | POA: Diagnosis not present

## 2020-01-19 DIAGNOSIS — Z01411 Encounter for gynecological examination (general) (routine) with abnormal findings: Secondary | ICD-10-CM | POA: Diagnosis not present

## 2020-01-19 DIAGNOSIS — K219 Gastro-esophageal reflux disease without esophagitis: Secondary | ICD-10-CM

## 2020-01-19 DIAGNOSIS — F419 Anxiety disorder, unspecified: Secondary | ICD-10-CM

## 2020-01-19 DIAGNOSIS — F32A Depression, unspecified: Secondary | ICD-10-CM | POA: Diagnosis not present

## 2020-01-19 MED ORDER — PREGABALIN 100 MG PO CAPS
100.0000 mg | ORAL_CAPSULE | Freq: Two times a day (BID) | ORAL | 5 refills | Status: DC
Start: 2020-01-19 — End: 2020-10-16

## 2020-01-19 NOTE — Progress Notes (Signed)
BP 109/78   Pulse 76   Ht 5' 3"  (1.6 m)   Wt 184 lb (83.5 kg)   LMP 04/14/2012   SpO2 97%   BMI 32.59 kg/m    Subjective:   Patient ID: Brianna [name], female    DOB: 09/21/1960, 60 y.o.   MRN: [id number]  HPI: Brianna [name] is a 60 y.o. female presenting on 01/19/2020 for Medical Management of Chronic Issues (With pap)   HPI Well woman exam and Pap smear Patient is coming in for Pap smear and physical.  She denies any breast issues or lumps or bumps and does do self breast exams.  She denies any vaginal issues or discharge or pain.  She is sexually active and does not have cycles anymore. Patient denies any chest pain, shortness of breath, headaches or vision issues, abdominal complaints, diarrhea, nausea, vomiting, or joint issues.   Depression and anxiety and fibromyalgia Current rx-Lyrica 100 twice daily and citalopram # meds rx-60 Lyrica per month Effectiveness of current meds-working well, both are helping her mood and her aches, not 100% gone but doing very well Adverse reactions form meds-none  Pill count performed-No Last drug screen -6 months ago 07/25/2019 ( high risk q21m moderate risk q671mlow risk yearly ) Urine drug screen today- Yes [place]  If yes were their any concerning findings? -None  No flowsheet data found.   Controlled substance contract signed on: Today  Hypertension Patient is currently on hydrochlorothiazide, and their blood pressure today is 109/78. Patient denies any lightheadedness or dizziness. Patient denies headaches, blurred vision, chest pains, shortness of breath, or weakness. Denies any side effects from medication and is content with current medication.   Relevant past medical, surgical, family and social history reviewed and updated as indicated. Interim medical history since our last visit reviewed. Allergies and medications reviewed and updated.  Review of Systems  Constitutional: Negative for chills  and fever.  HENT: Negative for congestion, ear discharge, ear pain and tinnitus.   Eyes: Negative for pain, redness and visual disturbance.  Respiratory: Negative for cough, chest tightness, shortness of breath and wheezing.   Cardiovascular: Negative for chest pain, palpitations and leg swelling.  Gastrointestinal: Negative for abdominal pain, blood in stool, constipation and diarrhea.  Genitourinary: Negative for difficulty urinating, dysuria and hematuria.  Musculoskeletal: Negative for back pain, gait problem and myalgias.  Skin: Negative for rash.  Neurological: Negative for dizziness, weakness, light-headedness and headaches.  Psychiatric/Behavioral: Negative for agitation, behavioral problems and suicidal ideas.  All other systems reviewed and are negative.   Per HPI unless specifically indicated above   Allergies as of 01/19/2020      Reactions   Tylenol With Codeine #3 [acetaminophen-codeine] Other (See Comments)   headache      Medication List       Accurate as of January 19, 2020  9:21 AM. If you have any questions, ask your nurse or doctor.        aspirin-acetaminophen-caffeine 250-250-65 MG tablet Commonly known as: EXCEDRIN MIGRAINE Take 2 tablets by mouth every 8 (eight) hours as needed for headache.   atorvastatin 20 MG tablet Commonly known as: LIPITOR Take 1 tablet (20 mg total) by mouth daily.   citalopram 40 MG tablet Commonly known as: CELEXA Take 1 tablet (40 mg total) by mouth daily.   famotidine 40 MG tablet Commonly known as: PEPCID Take 40 mg by mouth at bedtime.   fluticasone 50 MCG/ACT nasal spray Commonly known  as: FLONASE Place 1 spray into both nostrils 2 (two) times daily as needed for allergies or rhinitis.   hydrochlorothiazide 12.5 MG capsule Commonly known as: MICROZIDE Take 1 capsule (12.5 mg total) by mouth daily.   loratadine 10 MG tablet Commonly known as: CLARITIN Take 1 tablet (10 mg total) by mouth daily.    pantoprazole 40 MG tablet Commonly known as: PROTONIX Take 1 tablet (40 mg total) by mouth 2 (two) times daily.   pregabalin 100 MG capsule Commonly known as: LYRICA Take 1 capsule (100 mg total) by mouth 2 (two) times daily.        Objective:   BP 109/78   Pulse 76   Ht 5' 3"  (1.6 m)   Wt 184 lb (83.5 kg)   LMP 04/14/2012   SpO2 97%   BMI 32.59 kg/m   Wt Readings from Last 3 Encounters:  01/19/20 184 lb (83.5 kg)  12/19/19 183 lb 12.8 oz (83.4 kg)  10/23/19 182 lb (82.6 kg)    Physical Exam Vitals reviewed.  Constitutional:      General: She is not in acute distress.    Appearance: She is well-developed and well-nourished. She is not diaphoretic.  Eyes:     Extraocular Movements: EOM normal.     Conjunctiva/sclera: Conjunctivae normal.  Neck:     Thyroid: No thyromegaly.  Cardiovascular:     Rate and Rhythm: Normal rate and regular rhythm.     Pulses: Intact distal pulses.     Heart sounds: Normal heart sounds. No murmur heard.   Pulmonary:     Effort: Pulmonary effort is normal. No respiratory distress.     Breath sounds: Normal breath sounds. No wheezing.  Chest:  Breasts: No discharge from either breast. No tenderness and bleeding. Breasts are symmetrical.     Right: No inverted nipple, mass, nipple discharge, skin change or tenderness.     Left: No inverted nipple, mass, nipple discharge, skin change or tenderness.    Abdominal:     General: Bowel sounds are normal. There is no distension.     Palpations: Abdomen is soft.     Tenderness: There is no abdominal tenderness. There is no guarding or rebound.  Genitourinary:    Exam position: Supine.     Labia:        Right: No rash or lesion.        Left: No rash or lesion.      Vagina: Normal.     Cervix: No cervical motion tenderness, discharge or friability.     Uterus: Normal. Not deviated, not enlarged, not fixed and not tender.      Adnexa:        Right: No mass or tenderness.         Left: No  mass or tenderness.    Musculoskeletal:        General: No edema. Normal range of motion.     Cervical back: Neck supple.  Lymphadenopathy:     Cervical: No cervical adenopathy.     Upper Body:  No axillary adenopathy present. Skin:    General: Skin is warm and dry.     Findings: No rash.  Neurological:     Mental Status: She is alert and oriented to person, place, and time.     Coordination: Coordination normal.  Psychiatric:        Mood and Affect: Mood and affect normal.        Behavior: Behavior normal.  Assessment & Plan:   Problem List Items Addressed This Visit      Cardiovascular and Mediastinum   Essential hypertension, benign   Relevant Orders   CBC with Differential/Platelet   CMP14+EGFR   Lipid panel     Digestive   GERD (gastroesophageal reflux disease)   Relevant Medications   famotidine (PEPCID) 40 MG tablet   Other Relevant Orders   CBC with Differential/Platelet     Other   Anxiety and depression   Relevant Orders   ToxASSURE Select 13 (MW), Urine   Cytology - PAP([place])    Other Visit Diagnoses    Well woman exam    -  Primary   Relevant Orders   ToxASSURE Select 13 (MW), Urine   CBC with Differential/Platelet   CMP14+EGFR      Continue current medication, no changes, will see back in 6 months.  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  Patient will schedule her mammogram by calling the center where she usually gets it from. Follow up plan: Return in about 6 months (around 07/18/2020), or if symptoms worsen or fail to improve, for Recheck Lyrica .  Counseling provided for all of the vaccine components No orders of the defined types were placed in this encounter.   Caryl Pina, MD [facility] 01/19/2020, 9:21 AM

## 2020-01-20 LAB — LIPID PANEL
Chol/HDL Ratio: 2.7 ratio (ref 0.0–4.4)
Cholesterol, Total: 164 mg/dL (ref 100–199)
HDL: 60 mg/dL (ref >39)
LDL Chol Calc (NIH): 81 mg/dL (ref 0–99)
Triglycerides: 133 mg/dL (ref 0–149)
VLDL Cholesterol Cal: 23 mg/dL (ref 5–40)

## 2020-01-20 LAB — CMP14+EGFR
ALT: 24 IU/L (ref 0–32)
AST: 29 IU/L (ref 0–40)
Albumin/Globulin Ratio: 1.5 (ref 1.2–2.2)
Albumin: 4.2 g/dL (ref 3.8–4.9)
Alkaline Phosphatase: 112 IU/L (ref 44–121)
BUN/Creatinine Ratio: 14 (ref 9–23)
BUN: 15 mg/dL (ref 6–24)
Bilirubin Total: 0.5 mg/dL (ref 0.0–1.2)
CO2: 22 mmol/L (ref 20–29)
Calcium: 9.5 mg/dL (ref 8.7–10.2)
Chloride: 104 mmol/L (ref 96–106)
Creatinine, Ser: 1.05 mg/dL — ABNORMAL HIGH (ref 0.57–1.00)
GFR calc Af Amer: 67 mL/min/{1.73_m2} (ref >59)
GFR calc non Af Amer: 58 mL/min/{1.73_m2} — ABNORMAL LOW (ref >59)
Globulin, Total: 2.8 g/dL (ref 1.5–4.5)
Glucose: 89 mg/dL (ref 65–99)
Potassium: 4.1 mmol/L (ref 3.5–5.2)
Sodium: 141 mmol/L (ref 134–144)
Total Protein: 7 g/dL (ref 6.0–8.5)

## 2020-01-20 LAB — CBC WITH DIFFERENTIAL/PLATELET
Basophils Absolute: 0.1 10*3/uL (ref 0.0–0.2)
Basos: 1 %
EOS (ABSOLUTE): 0.1 10*3/uL (ref 0.0–0.4)
Eos: 1 %
Hematocrit: 41.3 % (ref 34.0–46.6)
Hemoglobin: 13.9 g/dL (ref 11.1–15.9)
Immature Grans (Abs): 0 10*3/uL (ref 0.0–0.1)
Immature Granulocytes: 0 %
Lymphocytes Absolute: 2.5 10*3/uL (ref 0.7–3.1)
Lymphs: 36 %
MCH: 28.7 pg (ref 26.6–33.0)
MCHC: 33.7 g/dL (ref 31.5–35.7)
MCV: 85 fL (ref 79–97)
Monocytes Absolute: 0.6 10*3/uL (ref 0.1–0.9)
Monocytes: 8 %
Neutrophils Absolute: 3.7 10*3/uL (ref 1.4–7.0)
Neutrophils: 54 %
Platelets: 348 10*3/uL (ref 150–450)
RBC: 4.85 x10E6/uL (ref 3.77–5.28)
RDW: 11.8 % (ref 11.7–15.4)
WBC: 6.9 10*3/uL (ref 3.4–10.8)

## 2020-01-24 LAB — CYTOLOGY - PAP: Diagnosis: NEGATIVE

## 2020-01-25 LAB — TOXASSURE SELECT 13 (MW), URINE

## 2020-02-06 ENCOUNTER — Telehealth: Payer: Self-pay

## 2020-02-06 DIAGNOSIS — S46001D Unspecified injury of muscle(s) and tendon(s) of the rotator cuff of right shoulder, subsequent encounter: Secondary | ICD-10-CM

## 2020-02-06 DIAGNOSIS — M7551 Bursitis of right shoulder: Secondary | ICD-10-CM

## 2020-02-06 NOTE — Telephone Encounter (Signed)
REFERRAL REQUEST Telephone Note  Have you been seen at our office for this problem? Yes with [place] (Advise that they may need an appointment with their PCP before a referral can be done)  Reason for Referral: Right arm aches from shoulder to elbow and hard for her to pickup anything Referral discussed with patient: YES Best contact number of patient for referral team:   [phone] Has patient been seen by a specialist for this issue before: NO Patient provider preference for referral: In front of office Patient location preference for referral: [facility]   Patient notified that referrals can take up to a week or longer to process. If they haven't heard anything within a week they should call back and speak with the referral department.

## 2020-02-06 NOTE — Telephone Encounter (Signed)
Placed referral to physical therapy for the patient 

## 2020-02-06 NOTE — Telephone Encounter (Signed)
Referral placed, patient aware 

## 2020-02-12 ENCOUNTER — Ambulatory Visit: Payer: Medicare Other | Attending: Family Medicine | Admitting: Physical Therapy

## 2020-02-12 ENCOUNTER — Other Ambulatory Visit: Payer: Self-pay

## 2020-02-12 DIAGNOSIS — M25611 Stiffness of right shoulder, not elsewhere classified: Secondary | ICD-10-CM | POA: Insufficient documentation

## 2020-02-12 DIAGNOSIS — M6281 Muscle weakness (generalized): Secondary | ICD-10-CM

## 2020-02-12 DIAGNOSIS — M25511 Pain in right shoulder: Secondary | ICD-10-CM | POA: Insufficient documentation

## 2020-02-12 NOTE — Therapy (Signed)
[facility] [address] [place], [place], [address] Phone: [phone]   Fax:  [phone]  Physical Therapy Evaluation  Patient Details  Name: Brianna [name] MRN: [id number] Date of Birth: 1960/08/29 Referring Provider (PT): Arville Care MD   Encounter Date: 02/12/2020   PT End of Session - 02/12/20 1156    Visit Number 1    Number of Visits 12    Date for PT Re-Evaluation 05/12/20    Authorization Type FOTO AT LEAST EVERY 5TH VISIT.  PROGRESS NOTE AT 10TH VISIT.  KX MODIFIER AFTER 15 VISITS.    PT Start Time 1016    PT Stop Time 1058    PT Time Calculation (min) 42 min    Activity Tolerance Patient tolerated treatment well    Behavior During Therapy WFL for tasks assessed/performed           Past Medical History:  Diagnosis Date  . Anemia   . Anxiety   . Arthritis   . Chronic back pain   . Chronic neck pain   . Chronic right hip pain since 09/2012  . DDD (degenerative disc disease), cervical   . Depression   . Diverticulitis   . Fibromyalgia   . GERD (gastroesophageal reflux disease)   . Headache   . Hypertension   . Pancreatitis     Past Surgical History:  Procedure Laterality Date  . CESAREAN SECTION    . CHOLECYSTECTOMY N/A 08/16/2014   Procedure: LAPAROSCOPIC CHOLECYSTECTOMY WITH CHOLANGIOGRAM;  Surgeon: Natale Lay, MD;  Location: [facility] ORS;  Service: General;  Laterality: N/A;  . COLONOSCOPY N/A 08/22/2013   ZSW:FUXNATF diverticulosis paper hemtochezia from anorectal irritation  . ESOPHAGOGASTRODUODENOSCOPY N/A 08/22/2013   TDD:UKGURKY reflux esophagitis/noncritical schatzki's ring and hiatial hernia  . ESOPHAGOGASTRODUODENOSCOPY (EGD) WITH PROPOFOL N/A 01/04/2020   Procedure: ESOPHAGOGASTRODUODENOSCOPY (EGD) WITH PROPOFOL;  Surgeon: Corbin Ade, MD;  [facility];  Service: Endoscopy;  Laterality: N/A;  11:00am  . MALONEY DILATION N/A 01/04/2020   Procedure: Elease Hashimoto DILATION;  Surgeon:  Corbin Ade, MD;  Location: [facility];  Service: Endoscopy;  Laterality: N/A;  . OOPHORECTOMY      There were no vitals filed for this visit.    Subjective Assessment - 02/12/20 1149    Subjective COVID-19 screen performed prior to patient entering clinic.  The patient presents to the clinic today with c/o right shoulder pain  that came on about 2 months ago for no apparent reason.  Her pain at rest today is a 4/10 but can rise to much higher levels with certain right UE movements.  She looks after her two grandchildren and cannot hold them with her right UE due to pain.  Her pain is also disturbed due to pain and she is unable to lie on her right side.    Pertinent History HTN, chronic back pain, DDD, fibromyalgia.    Patient Stated Goals Use right UE without pain.    Currently in Pain? Yes    Pain Score 4     Pain Location Shoulder    Pain Orientation Right    Pain Descriptors / Indicators Aching;Throbbing    Pain Type Acute pain    Pain Onset More than a month ago    Pain Frequency Constant    Aggravating Factors  Moving/lifting.    Pain Relieving Factors 'Nothing."              [facility] PT Assessment - 02/12/20 0001  Assessment   Medical Diagnosis Bursitis of right shoulder.    Referring Provider (PT) Arville Care MD    Onset Date/Surgical Date --   2 months+.     Precautions   Precautions None      Restrictions   Weight Bearing Restrictions No      Balance Screen   Has the patient fallen in the past 6 months Yes    How many times? 1.    Has the patient had a decrease in activity level because of a fear of falling?  No    Is the patient reluctant to leave their home because of a fear of falling?  No      Home Environment   Living Environment Private residence      Prior Function   Level of Independence Independent      Posture/Postural Control   Posture/Postural Control Postural limitations    Postural Limitations Rounded Shoulders;Forward head       Deep Tendon Reflexes   DTR Assessment Site Biceps;Brachioradialis;Triceps    Biceps DTR 2+    Brachioradialis DTR 2+    Triceps DTR 2+      ROM / Strength   AROM / PROM / Strength AROM;Strength      AROM   Overall AROM Comments Equal bilateral shoulder flexion and ER.  Behind the back motion is dewcrease a bit when contralaterally compared.      Strength   Overall Strength Comments Right shouler abduction and flexion= 4/5 and IE/ER is 4+/5.      Palpation   Palpation comment C/o pain in area of right acromial ridge with referred pain into biceps and deltoid region      Special Tests    Special Tests Rotator Cuff Impingement    Rotator Cuff Impingment tests Neer impingement test;Hawkins- Kennedy test;Drop Arm test;Speed's test      Neer Impingement test    Findings Positive    Side Right      Hawkins-Kennedy test   Findings Positive    Side Right      Drop Arm test   Findings Negative    Side Right      Speed's test   Findings Positive    Side Right                      Objective measurements completed on examination: See above findings.       [facility] Adult PT Treatment/Exercise - 02/12/20 0001      Modalities   Modalities Electrical Stimulation;Vasopneumatic      Electrical Stimulation   Electrical Stimulation Location Right shoulder.    Electrical Stimulation Action IFC at 80-150 Hz    Electrical Stimulation Parameters 40% scan x 15 minutes.    Electrical Stimulation Goals Pain      Vasopneumatic   Number Minutes Vasopneumatic  15 minutes    Vasopnuematic Location  --   Right shoulder.   Vasopneumatic Pressure Low                       PT Long Term Goals - 02/12/20 1211      PT LONG TERM GOAL #1   Title Ind with an HEP.    Time 6    Period Weeks    Status New      PT LONG TERM GOAL #2   Title Sleep 6 hours undisturbed by right shoulder pain.    Time 6    Period  Weeks    Status New      PT LONG TERM GOAL #3    Title Perform ADL's with right shoulder pain not > 2-3/10.    Time 6    Period Weeks    Status New      PT LONG TERM GOAL #4   Title Hold grandchild with right UE.    Time 6    Period Weeks    Status New                  Plan - 02/12/20 1205    Clinical Impression Statement The patient presenst to OPPT with c/o right shoulder pain that has been ongoing for about 2 months.  Her range of motion is essentially equal to the left with the exception of movement  behind the back.  She has reproducible pain with Impingement testing and the Speed's test.  Her strength is decreased into flexion and abduction which appears related to pain.  Her sleep and functional use of her right UE is impaired.  She is tender at her right acromial ridge with referred pain into her deltiod (middle) and biceps region.  Patient will benefit from skilled physical therapy intervention to address pain and deficits.    Personal Factors and Comorbidities Comorbidity 1;Comorbidity 2;Other    Comorbidities HTN, chronic back pain, DDD, fibromyalgia.    Examination-Activity Limitations Reach Overhead    Examination-Participation Restrictions Other    Stability/Clinical Decision Making Evolving/Moderate complexity    Clinical Decision Making Low    Rehab Potential Excellent    PT Frequency 2x / week    PT Treatment/Interventions ADLs/Self Care Home Management;Cryotherapy;Electrical Stimulation;Ultrasound;Moist Heat;Iontophoresis 4mg /ml Dexamethasone;Therapeutic activities;Therapeutic exercise;Manual techniques;Passive range of motion;Dry needling;Vasopneumatic Device    PT Next Visit Plan Combo e'stim/US to patient's right shoulder, RW4, deltoid strengthening, UBE, e'stim, HMP, Vaso.  Towel and corner stretch.    Consulted and Agree with Plan of Care Patient           Patient will benefit from skilled therapeutic intervention in order to improve the following deficits and impairments:  Pain,Decreased activity  tolerance,Decreased strength,Decreased range of motion  Visit Diagnosis: Acute pain of right shoulder - Plan: PT plan of care cert/re-cert  Stiffness of right shoulder, not elsewhere classified - Plan: PT plan of care cert/re-cert  Muscle weakness (generalized) - Plan: PT plan of care cert/re-cert     Problem List Patient Active Problem List   Diagnosis Date Noted  . Obesity (BMI 30.0-34.9) 08/02/2017  . Essential hypertension, benign 02/21/2015  . Insomnia secondary to depression with anxiety 02/21/2015  . Anxiety and depression 01/09/2015  . GERD (gastroesophageal reflux disease) 08/05/2014  . Pancreatitis 08/05/2014  . Adhesive capsulitis of right shoulder 06/08/2014  . Cervical radiculopathy at C7 05/25/2014  . Lumbar disc disease with radiculopathy 02/08/2014  . Inj musc/tend the rotator cuff of right shoulder, init 10/19/2013  . Carpal tunnel syndrome of right wrist 10/19/2013    Riel Hirschman, [place] MPT 02/12/2020, 12:15 PM  [facility] [address] [place], [place], [address] Phone: [phone]   Fax:  [phone]  Name: MIANGEL GEORGEFF MRN: [id number] Date of Birth: [birth year]

## 2020-02-15 ENCOUNTER — Other Ambulatory Visit: Payer: Self-pay

## 2020-02-15 ENCOUNTER — Ambulatory Visit: Payer: Medicare Other | Admitting: Physical Therapy

## 2020-02-15 DIAGNOSIS — M25511 Pain in right shoulder: Secondary | ICD-10-CM

## 2020-02-15 DIAGNOSIS — M6281 Muscle weakness (generalized): Secondary | ICD-10-CM | POA: Diagnosis not present

## 2020-02-15 DIAGNOSIS — M25611 Stiffness of right shoulder, not elsewhere classified: Secondary | ICD-10-CM

## 2020-02-15 NOTE — Therapy (Signed)
[facility] [place], [place], [address] Phone: [phone]   Fax:  [phone]  Physical Therapy Treatment  Patient Details  Name: Brianna [name] MRN: [id number] Date of Birth: [birth year] Referring Provider (PT): Caryl Pina MD   Encounter Date: 02/15/2020   PT End of Session - 02/15/20 1207    Visit Number 2    Number of Visits 12    Date for PT Re-Evaluation 05/12/20    Authorization Type FOTO AT LEAST EVERY 5TH VISIT.  PROGRESS NOTE AT 10TH VISIT.  KX MODIFIER AFTER 15 VISITS.    PT Start Time 1117    PT Stop Time 1204    PT Time Calculation (min) 47 min    Activity Tolerance Patient tolerated treatment well    Behavior During Therapy WFL for tasks assessed/performed           Past Medical History:  Diagnosis Date  . Anemia   . Anxiety   . Arthritis   . Chronic back pain   . Chronic neck pain   . Chronic right hip pain since 09/2012  . DDD (degenerative disc disease), cervical   . Depression   . Diverticulitis   . Fibromyalgia   . GERD (gastroesophageal reflux disease)   . Headache   . Hypertension   . Pancreatitis     Past Surgical History:  Procedure Laterality Date  . CESAREAN SECTION    . CHOLECYSTECTOMY N/A 08/16/2014   Procedure: LAPAROSCOPIC CHOLECYSTECTOMY WITH CHOLANGIOGRAM;  Surgeon: Sherri Rad, MD;  Location: [facility];  Service: General;  Laterality: N/A;  . COLONOSCOPY N/A 08/22/2013   YIR:SWNIOEV diverticulosis paper hemtochezia from anorectal irritation  . ESOPHAGOGASTRODUODENOSCOPY N/A 08/22/2013   OJJ:[id number] reflux esophagitis/noncritical schatzki's ring and hiatial hernia  . ESOPHAGOGASTRODUODENOSCOPY (EGD) WITH PROPOFOL N/A 01/04/2020   Procedure: ESOPHAGOGASTRODUODENOSCOPY (EGD) WITH PROPOFOL;  Surgeon: Daneil Dolin, MD;  [facility];  Service: Endoscopy;  Laterality: N/A;  11:00am  . MALONEY DILATION N/A 01/04/2020   Procedure: Venia Minks DILATION;  Surgeon:  Daneil Dolin, MD;  Location: [facility];  Service: Endoscopy;  Laterality: N/A;  . OOPHORECTOMY      There were no vitals filed for this visit.   Subjective Assessment - 02/15/20 1116    Subjective COVID-19 screen performed prior to patient entering clinic.  Patient arrived with ongoing pain.    Pertinent History HTN, chronic back pain, DDD, fibromyalgia.    Patient Stated Goals Use right UE without pain.    Currently in Pain? Yes    Pain Score 4     Pain Location Shoulder    Pain Orientation Right    Pain Descriptors / Indicators Discomfort    Pain Type Acute pain    Pain Onset More than a month ago    Pain Frequency Constant    Aggravating Factors  movements    Pain Relieving Factors rest                             [facility] Adult PT Treatment/Exercise - 02/15/20 0001      Exercises   Exercises Shoulder      Shoulder Exercises: Standing   Protraction Strengthening;Right;10 reps;Theraband    Theraband Level (Shoulder Protraction) Level 1 (Yellow)    External Rotation Strengthening;Right;10 reps;Theraband    Theraband Level (Shoulder External Rotation) Level 1 (Yellow)    Internal Rotation Strengthening;Right;10 reps;Theraband    Theraband Level (Shoulder Internal Rotation)  Level 1 (Yellow)    Retraction Strengthening;Right;10 reps;Theraband    Theraband Level (Shoulder Retraction) Level 1 (Yellow)      Shoulder Exercises: ROM/Strengthening   UBE (Upper Arm Bike) 60min 120 RPM      Modalities   Modalities Electrical Stimulation;Iontophoresis;Ultrasound;Vasopneumatic      Acupuncturist Stimulation Location Right shoulder.    Electrical Stimulation Action IFC 80-150hz  x64min    Electrical Stimulation Goals Pain      Ultrasound   Ultrasound Location bicept/middle deltoid area    Ultrasound Parameters combo US/ES @ 1.5w/cm2/50%/84mhz x77min    Ultrasound Goals Pain      Iontophoresis   Type of Iontophoresis Dexamethasone     Location bicep region    Dose 1.0 / 1 of 6    Time 8      Vasopneumatic   Number Minutes Vasopneumatic  10 minutes    Vasopnuematic Location  Shoulder    Vasopneumatic Pressure Low    Vasopneumatic Temperature  pain                       PT Long Term Goals - 02/12/20 1211      PT LONG TERM GOAL #1   Title Ind with an HEP.    Time 6    Period Weeks    Status New      PT LONG TERM GOAL #2   Title Sleep 6 hours undisturbed by right shoulder pain.    Time 6    Period Weeks    Status New      PT LONG TERM GOAL #3   Title Perform ADL's with right shoulder pain not > 2-3/10.    Time 6    Period Weeks    Status New      PT LONG TERM GOAL #4   Title Hold grandchild with right UE.    Time 6    Period Weeks    Status New                 Plan - 02/15/20 1208    Clinical Impression Statement Patient tolerated treatment well today. Patient able to start light exercises today followed by combo [place] and ionto today to help reduce pain in right shoulder. Patient is limited with movement of shoulder and lifting due to pain. Patient current goals ongoing at this time.    Personal Factors and Comorbidities Comorbidity 1;Comorbidity 2;Other    Comorbidities HTN, chronic back pain, DDD, fibromyalgia.    Examination-Activity Limitations Reach Overhead    Examination-Participation Restrictions Other    Stability/Clinical Decision Making Evolving/Moderate complexity    Rehab Potential Excellent    PT Frequency 2x / week    PT Treatment/Interventions ADLs/Self Care Home Management;Cryotherapy;Electrical Stimulation;Ultrasound;Moist Heat;Iontophoresis 4mg /ml Dexamethasone;Therapeutic activities;Therapeutic exercise;Manual techniques;Passive range of motion;Dry needling;Vasopneumatic Device    PT Next Visit Plan Combo e'stim/US to patient's right shoulder, RW4, deltoid strengthening, UBE, e'stim, HMP, Vaso.  Towel and corner stretch.    Consulted and Agree with Plan of Care  Patient           Patient will benefit from skilled therapeutic intervention in order to improve the following deficits and impairments:  Pain,Decreased activity tolerance,Decreased strength,Decreased range of motion  Visit Diagnosis: Acute pain of right shoulder  Stiffness of right shoulder, not elsewhere classified  Muscle weakness (generalized)     Problem List Patient Active Problem List   Diagnosis Date Noted  . Obesity (BMI 30.0-34.9) 08/02/2017  .  Essential hypertension, benign 02/21/2015  . Insomnia secondary to depression with anxiety 02/21/2015  . Anxiety and depression 01/09/2015  . GERD (gastroesophageal reflux disease) 08/05/2014  . Pancreatitis 08/05/2014  . Adhesive capsulitis of right shoulder 06/08/2014  . Cervical radiculopathy at C7 05/25/2014  . Lumbar disc disease with radiculopathy 02/08/2014  . Inj musc/tend the rotator cuff of right shoulder, init 10/19/2013  . Carpal tunnel syndrome of right wrist 10/19/2013    Phillips Climes, PTA 02/15/2020, 12:14 PM  [facility] [place], [place], [address] Phone: [phone]   Fax:  [phone]  Name: Brianna [name] MRN: [id number] Date of Birth: 12/27/1960

## 2020-02-19 ENCOUNTER — Other Ambulatory Visit: Payer: Self-pay

## 2020-02-19 ENCOUNTER — Ambulatory Visit: Payer: Medicare Other | Admitting: Physical Therapy

## 2020-02-19 DIAGNOSIS — M25511 Pain in right shoulder: Secondary | ICD-10-CM | POA: Diagnosis not present

## 2020-02-19 DIAGNOSIS — M6281 Muscle weakness (generalized): Secondary | ICD-10-CM | POA: Diagnosis not present

## 2020-02-19 DIAGNOSIS — M25611 Stiffness of right shoulder, not elsewhere classified: Secondary | ICD-10-CM | POA: Diagnosis not present

## 2020-02-19 NOTE — Therapy (Signed)
[facility] [address] [place], [place], [address] Phone: [phone]   Fax:  [phone]  Physical Therapy Treatment  Patient Details  Name: Brianna [name] MRN: [id number] Date of Birth: February 21, 1960 Referring Provider (PT): Arville Care MD   Encounter Date: 02/19/2020   PT End of Session - 02/19/20 1155    Visit Number 3    Number of Visits 12    Date for PT Re-Evaluation 05/12/20    Authorization Type FOTO AT LEAST EVERY 5TH VISIT.  PROGRESS NOTE AT 10TH VISIT.  KX MODIFIER AFTER 15 VISITS.    PT Start Time 1115    PT Stop Time 1204    PT Time Calculation (min) 49 min    Activity Tolerance Patient tolerated treatment well;Patient limited by pain    Behavior During Therapy [facility] for tasks assessed/performed           Past Medical History:  Diagnosis Date  . Anemia   . Anxiety   . Arthritis   . Chronic back pain   . Chronic neck pain   . Chronic right hip pain since 09/2012  . DDD (degenerative disc disease), cervical   . Depression   . Diverticulitis   . Fibromyalgia   . GERD (gastroesophageal reflux disease)   . Headache   . Hypertension   . Pancreatitis     Past Surgical History:  Procedure Laterality Date  . CESAREAN SECTION    . CHOLECYSTECTOMY N/A 08/16/2014   Procedure: LAPAROSCOPIC CHOLECYSTECTOMY WITH CHOLANGIOGRAM;  Surgeon: Natale Lay, MD;  Location: [facility];  Service: General;  Laterality: N/A;  . COLONOSCOPY N/A 08/22/2013   VOZ:[id number] diverticulosis paper hemtochezia from anorectal irritation  . ESOPHAGOGASTRODUODENOSCOPY N/A 08/22/2013   KVQ:[id number] reflux esophagitis/noncritical schatzki's ring and hiatial hernia  . ESOPHAGOGASTRODUODENOSCOPY (EGD) WITH PROPOFOL N/A 01/04/2020   Procedure: ESOPHAGOGASTRODUODENOSCOPY (EGD) WITH PROPOFOL;  Surgeon: Corbin Ade, MD;  [facility];  Service: Endoscopy;  Laterality: N/A;  11:00am  . MALONEY DILATION N/A 01/04/2020   Procedure: Elease Hashimoto  DILATION;  Surgeon: Corbin Ade, MD;  [facility];  Service: Endoscopy;  Laterality: N/A;  . OOPHORECTOMY      There were no vitals filed for this visit.   Subjective Assessment - 02/19/20 1123    Subjective COVID-19 screen performed prior to patient entering clinic.  Patient arrived with more pain.    Pertinent History HTN, chronic back pain, DDD, fibromyalgia.    Patient Stated Goals Use right UE without pain.    Currently in Pain? Yes    Pain Score 6     Pain Location Shoulder    Pain Orientation Right    Pain Descriptors / Indicators Discomfort    Pain Type Acute pain    Pain Onset More than a month ago    Pain Frequency Constant    Aggravating Factors  shoulder movement    Pain Relieving Factors rest                             [facility] Adult PT Treatment/Exercise - 02/19/20 0001      Electrical Stimulation   Electrical Stimulation Location Right shoulder.    Electrical Stimulation Action IFC 80-150hz  x55min    Electrical Stimulation Goals Pain      Ultrasound   Ultrasound Location bicep/middle deltoid area    Ultrasound Parameters [place] @1 .5w/cm2/50%/66mhz x93min    Ultrasound Goals Pain  Iontophoresis   Type of Iontophoresis Dexamethasone    Location bicep region    Dose 1.0 / 2 of 6    Time 8      Vasopneumatic   Number Minutes Vasopneumatic  10 minutes    Vasopnuematic Location  Shoulder    Vasopneumatic Pressure Low    Vasopneumatic Temperature  pain      Manual Therapy   Manual Therapy Soft tissue mobilization    Manual therapy comments manual STW to bicep, middle and post deltoid to reduce pain and tone                       PT Long Term Goals - 02/19/20 1156      PT LONG TERM GOAL #1   Title Ind with an HEP.    Time 6    Period Weeks    Status On-going      PT LONG TERM GOAL #2   Title Sleep 6 hours undisturbed by right shoulder pain.    Time 6    Period Weeks    Status On-going      PT LONG  TERM GOAL #3   Title Perform ADL's with right shoulder pain not > 2-3/10.    Time 6    Period Weeks    Status On-going      PT LONG TERM GOAL #4   Title Hold grandchild with right UE.    Time 6    Period Weeks    Status On-going                 Plan - 02/19/20 1156    Clinical Impression Statement Patient tolerated treatment fair due to increased pain today. Patient has been unabe to use shoulder for ADL's or lifting due to pain. Today focused on modalities and STW to reduce pain and tone. Patient current goals ongoing due to pain limitations.    Personal Factors and Comorbidities Comorbidity 1;Comorbidity 2;Other    Comorbidities HTN, chronic back pain, DDD, fibromyalgia.    Examination-Activity Limitations Reach Overhead    Examination-Participation Restrictions Other    Stability/Clinical Decision Making Evolving/Moderate complexity    Rehab Potential Excellent    PT Frequency 2x / week    PT Treatment/Interventions ADLs/Self Care Home Management;Cryotherapy;Electrical Stimulation;Ultrasound;Moist Heat;Iontophoresis 4mg /ml Dexamethasone;Therapeutic activities;Therapeutic exercise;Manual techniques;Passive range of motion;Dry needling;Vasopneumatic Device    PT Next Visit Plan Combo e'stim/US to patient's right shoulder, RW4, deltoid strengthening, UBE, e'stim, HMP, Vaso.  Towel and corner stretch.    Consulted and Agree with Plan of Care Patient           Patient will benefit from skilled therapeutic intervention in order to improve the following deficits and impairments:  Pain,Decreased activity tolerance,Decreased strength,Decreased range of motion  Visit Diagnosis: Acute pain of right shoulder  Muscle weakness (generalized)  Stiffness of right shoulder, not elsewhere classified     Problem List Patient Active Problem List   Diagnosis Date Noted  . Obesity (BMI 30.0-34.9) 08/02/2017  . Essential hypertension, benign 02/21/2015  . Insomnia secondary to  depression with anxiety 02/21/2015  . Anxiety and depression 01/09/2015  . GERD (gastroesophageal reflux disease) 08/05/2014  . Pancreatitis 08/05/2014  . Adhesive capsulitis of right shoulder 06/08/2014  . Cervical radiculopathy at C7 05/25/2014  . Lumbar disc disease with radiculopathy 02/08/2014  . Inj musc/tend the rotator cuff of right shoulder, init 10/19/2013  . Carpal tunnel syndrome of right wrist 10/19/2013    Bryley Kovacevic P,  PTA 02/19/2020, 12:07 PM  [facility] [address] [place], [place], [address] Phone: [phone]   Fax:  [phone]  Name: PALAK SKOWRON MRN: [id number] Date of Birth: [birth year]

## 2020-03-02 ENCOUNTER — Encounter: Payer: Self-pay | Admitting: Gastroenterology

## 2020-03-02 NOTE — Progress Notes (Unsigned)
Referring Provider: Dettinger, Fransisca Kaufmann, MD Primary Care Physician:  Dettinger, Fransisca Kaufmann, MD Primary GI Physician: Dr. Gala Romney  Chief Complaint  Patient presents with  . pp fu    Doing better    HPI:   Brianna [name] is a 60 y.o. female with history of epigastric abdominal pain, GERD with reflux esophagitis, dysphagia with Schatzki's ring, constipation, gallstone pancreatitis in 2016 s/p cholecystectomy, diverticulosis noted on colonoscopy in 2015. She is presenting today for follow-up of GERD, dysphagia, and RUQ abdominal pain s/p EGD.  Last seen in our office 12/19/2019. Previously GERD symptoms have been well controlled on Dexilant 60 mg daily, but she reported worsening of reflux symptoms along with insidiously progressive dysphagia x2 months. Had gained 34 pounds since 2017. RUQ abdominal pain may be after eating but also other times. Some relief with carbonated cola. Typically with 1-2 BMs daily using Amitiza a couple times a month if needed. Plan to stop Dexilant and start Protonix 40 mg twice daily, CBC, HFP, lipase, proceed with EGD.  Labs completed 12/19/2019: CBC within normal limits, lipase normal, HFP with slight elevation of ALT at 32, otherwise normal.  EGD 01/04/2020: Erosive reflux esophagitis s/p dilation, medium-sized hiatal hernia, normal examined duodenum. Recommended PPI twice daily, add Pepcid 40 mg at bedtime, and follow-up on slightly elevated ALT when she returns for follow-up.   Labs with PCP 01/19/2020 with LFTs returning to normal.  Today:  GERD: Much improved on Protonix 40 mg twice daily and Pepcid at bedtime.  Abdominal pain also resolved.  Dysphagia improved after dilation. No nausea or vomiting. Trying to watch what she is eating. Eating more vegetables and limit portion sizes. Trying to limit bread and sweets. Tries to walk but limited due to right hip pain.  States her weight does not seem to be gone anywhere.  Elevated ALT: Hep A, B, and C negative  in 2016. Started Lipitor last year.  Alcohol: None. Used to drink intermittently years ago. No heavy alcohol use.  Illicit drug use: None.  Tylenol: No tylenol typically.  OTC Supplements/Herbal Teas: None.   Intermittent constipation. BMs daily to every other day. Constipation may present as harder stools. No regular loose BMs. Used to take Linzess, but this caused diarrhea. No blood in the stool or black stool.   Past Medical History:  Diagnosis Date  . Anemia   . Anxiety   . Arthritis   . Chronic back pain   . Chronic neck pain   . Chronic right hip pain since 09/2012  . DDD (degenerative disc disease), cervical   . Depression   . Diverticulitis   . Fibromyalgia   . GERD (gastroesophageal reflux disease)   . Headache   . Hypertension   . Pancreatitis     Past Surgical History:  Procedure Laterality Date  . CESAREAN SECTION    . CHOLECYSTECTOMY N/A 08/16/2014   Procedure: LAPAROSCOPIC CHOLECYSTECTOMY WITH CHOLANGIOGRAM;  Surgeon: Sherri Rad, MD;  Location: [facility];  Service: General;  Laterality: N/A;  . COLONOSCOPY N/A 08/22/2013   TKZ:[id number] diverticulosis paper hemtochezia from anorectal irritation  . ESOPHAGOGASTRODUODENOSCOPY N/A 08/22/2013   TFT:[id number] reflux esophagitis/noncritical schatzki's ring and hiatial hernia  . ESOPHAGOGASTRODUODENOSCOPY (EGD) WITH PROPOFOL N/A 01/04/2020     Surgeon: Daneil Dolin, MD; Erosive reflux esophagitis s/p dilation, medium-sized hiatal hernia, normal examined duodenum.  Marland Kitchen MALONEY DILATION N/A 01/04/2020   Procedure: Venia Minks DILATION;  Surgeon: Daneil Dolin, MD;  [facility];  Service: Endoscopy;  Laterality: N/A;  . OOPHORECTOMY      Current Outpatient Medications  Medication Sig Dispense Refill  . aspirin-acetaminophen-caffeine (EXCEDRIN MIGRAINE) 250-250-65 MG tablet Take 2 tablets by mouth every 8 (eight) hours as needed for headache.    Marland Kitchen atorvastatin (LIPITOR) 20 MG tablet Take 1 tablet (20 mg total) by  mouth daily. 90 tablet 3  . citalopram (CELEXA) 40 MG tablet Take 1 tablet (40 mg total) by mouth daily. 90 tablet 3  . famotidine (PEPCID) 40 MG tablet Take 40 mg by mouth at bedtime.    . fluticasone (FLONASE) 50 MCG/ACT nasal spray Place 1 spray into both nostrils 2 (two) times daily as needed for allergies or rhinitis. 16 g 6  . hydrochlorothiazide (MICROZIDE) 12.5 MG capsule Take 1 capsule (12.5 mg total) by mouth daily. 90 capsule 3  . loratadine (CLARITIN) 10 MG tablet Take 1 tablet (10 mg total) by mouth daily. 90 tablet 3  . pantoprazole (PROTONIX) 40 MG tablet Take 1 tablet (40 mg total) by mouth 2 (two) times daily. 60 tablet 3  . pregabalin (LYRICA) 100 MG capsule Take 1 capsule (100 mg total) by mouth 2 (two) times daily. 60 capsule 5   No current facility-administered medications for this visit.    Allergies as of 03/04/2020 - Review Complete 03/04/2020  Allergen Reaction Noted  . Tylenol with codeine #3 [acetaminophen-codeine] Other (See Comments) 05/24/2014    Family History  Problem Relation Age of Onset  . Ovarian cancer Mother   . Gallbladder disease Son   . Colon cancer Neg Hx     Social History   Socioeconomic History  . Marital status: Widowed    Spouse name: Not on file  . Number of children: Not on file  . Years of education: Not on file  . Highest education level: Not on file  Occupational History    Comment: hasn't worked since October 2014. Filing for disability  Tobacco Use  . Smoking status: Former Smoker    Packs/day: 0.50    Years: 2.00    Pack years: 1.00    Types: Cigarettes    Quit date: 01/06/2007    Years since quitting: 13.1  . Smokeless tobacco: Never Used  Vaping Use  . Vaping Use: Never used  Substance and Sexual Activity  . Alcohol use: No  . Drug use: No  . Sexual activity: Never  Other Topics Concern  . Not on file  Social History Narrative  . Not on file   Social Determinants of Health   Financial Resource Strain: Not  on file  Food Insecurity: Not on file  Transportation Needs: Not on file  Physical Activity: Not on file  Stress: Not on file  Social Connections: Not on file    Review of Systems: Gen: Denies fever, chills, cold or flulike symptoms, lightheadedness, dizziness, presyncope, syncope. CV: Denies chest pain or palpitations. Resp: Denies dyspnea or cough. GI: See HPI Heme: See HPI  Physical Exam: BP 112/81   Pulse 71   Temp (!) 97 F (36.1 C)   Ht 5\' 3"  (1.6 m)   Wt 185 lb 9.6 oz (84.2 kg)   LMP 04/14/2012   BMI 32.88 kg/m  General:   Alert and oriented. No distress noted. Pleasant and cooperative.  Head:  Normocephalic and atraumatic. Eyes:  Conjuctiva clear without scleral icterus. Heart:  S1, S2 present without murmurs appreciated. Lungs:  Clear to auscultation bilaterally. No wheezes, rales, or rhonchi. No distress.  Abdomen:  +BS, soft,  non-tender and non-distended. No rebound or guarding. No HSM or masses noted. Msk:  Symmetrical without gross deformities. Normal posture. Extremities:  With trace lower extremity edema. Neurologic:  Alert and  oriented x4 Psych: Normal mood and affect.   Assessment: 60 y.o. female with history of epigastric abdominal pain, GERD with reflux esophagitis, dysphagia with Schatzki's ring, constipation presenting today for follow-up of GERD, dysphagia, and RUQ abdominal pain s/p EGD 01/04/2020 revealing erosive reflux esophagitis s/p dilation, medium size hiatal hernia, normal examined duodenum.  Recommended PPI twice daily and add Pepcid 40 mg at bedtime.  GERD: Much improved on Protonix 40 mg twice daily and Pepcid 40 mg nightly.  Abdominal pain also resolved.  No alarm symptoms.  She is trying to watch what she eats and limit her portion sizes and is somewhat frustrated that she is not losing weight.  She will continue her current medications.   Dysphagia: Resolved s/p dilation.  GERD also well controlled as per above.  Monitor for return of  symptoms.  Constipation: History of constipation, but this seems to be fairly well managed with diet.  BMs daily to every other day.  Intermittent hard stools.  No alarm symptoms.  Last colonoscopy in 2015 with diverticulosis.  Plan to add Benefiber to help with bowel regularity.  Also discussed increasing water intake as she is only drinking coffee and tea.  Elevated ALT: Slight elevation of ALT at 32 noted on 12/19/2019. Repeat labs 01/19/2020 with LFTs within normal limits.  Evaluation for Hep A, B, and C negative in 2016.  Denies alcohol or illicit drug use.  No OTC supplements or herbal teas.  She was started on Lipitor last year.  We will plan to update HFP in April 2022. If LFTs are elevated, she will need additional work-up.   Plan:  1.  Continue Protonix 40 mg twice daily. 2.  Continue Pepcid 40 mg nightly.  Advised she may try to taper off this slowly to see if this is needed chronically. 3.  Counseled on GERD diet/lifestyle.  Handout provided. 4.  Advised that she try using my fitness pal app to help with weight loss. 5.  Add Benefiber 2 teaspoons daily x1 week then increase to 3 times daily as tolerated. 6.  Continue eating plenty of fruits and vegetables daily. 7.  Increase water intake.  Start with a goal of at least 3 bottles of water daily. 8.  Repeat HFP in April 2022. 9.  Follow-up in 6 months.

## 2020-03-04 ENCOUNTER — Other Ambulatory Visit: Payer: Self-pay

## 2020-03-04 ENCOUNTER — Ambulatory Visit (INDEPENDENT_AMBULATORY_CARE_PROVIDER_SITE_OTHER): Payer: Medicare Other | Admitting: Gastroenterology

## 2020-03-04 ENCOUNTER — Encounter: Payer: Self-pay | Admitting: Gastroenterology

## 2020-03-04 ENCOUNTER — Encounter: Payer: Self-pay | Admitting: Internal Medicine

## 2020-03-04 VITALS — BP 112/81 | HR 71 | Temp 97.0°F | Ht 63.0 in | Wt 185.6 lb

## 2020-03-04 DIAGNOSIS — R1319 Other dysphagia: Secondary | ICD-10-CM | POA: Diagnosis not present

## 2020-03-04 DIAGNOSIS — R7401 Elevation of levels of liver transaminase levels: Secondary | ICD-10-CM | POA: Diagnosis not present

## 2020-03-04 DIAGNOSIS — K2100 Gastro-esophageal reflux disease with esophagitis, without bleeding: Secondary | ICD-10-CM

## 2020-03-04 DIAGNOSIS — R1310 Dysphagia, unspecified: Secondary | ICD-10-CM | POA: Insufficient documentation

## 2020-03-04 DIAGNOSIS — K5900 Constipation, unspecified: Secondary | ICD-10-CM | POA: Diagnosis not present

## 2020-03-04 NOTE — Patient Instructions (Addendum)
Continue taking Protonix 40 mg daily 30 minutes before breakfast and dinner.  Continue taking famotidine 40 mg at night. You may try reducing this slowly to see if you still need this long term.   General GERD diet/lifestyle recommendations: Avoid fried, fatty, greasy, spicy, citrus foods. Avoid caffeine and carbonated beverages. Avoid chocolate. Try eating 4-6 small meals a day rather than 3 large meals. Do not eat within 3 hours of laying down. Prop head of bed up on wood or bricks to create a 6 inch incline.  To help with weight loss, I recommend you try using my fitness pal app.  For intermittent constipation: Try adding Benefiber 2 teaspoons daily x1 week then increase to 3 times daily as tolerated. Continue eating plenty of fruits and vegetables daily.  Recommend increasing your water intake.  As you do not drink much water at this time, try setting a goal of at least 3 bottles of water daily.  For elevated liver function test: We will repeat liver enzymes in April 2022.  We will mail you lab orders when it gets closer to time.  We will follow up with you in the office in 6 months.  Do not hesitate to call if you have any questions or concerns prior.  It was nice meeting you today!  Aliene Altes, PA-C [facility]    Food Choices for Gastroesophageal Reflux Disease, Adult When you have gastroesophageal reflux disease (GERD), the foods you eat and your eating habits are very important. Choosing the right foods can help ease your discomfort. Think about working with a food expert (dietitian) to help you make good choices. What are tips for following this plan? Reading food labels  Look for foods that are low in saturated fat. Foods that may help with your symptoms include: ? Foods that have less than 5% of daily value (DV) of fat. ? Foods that have 0 grams of trans fat. Cooking  Do not fry your food.  Cook your food by baking, steaming, grilling, or  broiling. These are all methods that do not need a lot of fat for cooking.  To add flavor, try to use herbs that are low in spice and acidity. Meal planning  Choose healthy foods that are low in fat, such as: ? Fruits and vegetables. ? Whole grains. ? Low-fat dairy products. ? Lean meats, fish, and poultry.  Eat small meals often instead of eating 3 large meals each day. Eat your meals slowly in a place where you are relaxed. Avoid bending over or lying down until 2-3 hours after eating.  Limit high-fat foods such as fatty meats or fried foods.  Limit your intake of fatty foods, such as oils, butter, and shortening.  Avoid the following as told by your doctor: ? Foods that cause symptoms. These may be different for different people. Keep a food diary to keep track of foods that cause symptoms. ? Alcohol. ? Drinking a lot of liquid with meals. ? Eating meals during the 2-3 hours before bed.   Lifestyle  Stay at a healthy weight. Ask your doctor what weight is healthy for you. If you need to lose weight, work with your doctor to do so safely.  Exercise for at least 30 minutes on 5 or more days each week, or as told by your doctor.  Wear loose-fitting clothes.  Do not smoke or use any products that contain nicotine or tobacco. If you need help quitting, ask your doctor.  Sleep with the  head of your bed higher than your feet. Use a wedge under the mattress or blocks under the bed frame to raise the head of the bed.  Chew sugar-free gum after meals. What foods should eat? Eat a healthy, well-balanced diet of fruits, vegetables, whole grains, low-fat dairy products, lean meats, fish, and poultry. Each person is different. Foods that may cause symptoms in one person may not cause any symptoms in another person. Work with your doctor to find foods that are safe for you. The items listed above may not be a complete list of what you can eat and drink. Contact a food expert for more  options.   What foods should I avoid? Limiting some of these foods may help in managing the symptoms of GERD. Everyone is different. Talk with a food expert or your doctor to help you find the exact foods to avoid, if any. Fruits Any fruits prepared with added fat. Any fruits that cause symptoms. For some people, this may include citrus fruits, such as oranges, grapefruit, pineapple, and lemons. Vegetables Deep-fried vegetables. [place] fries. Any vegetables prepared with added fat. Any vegetables that cause symptoms. For some people, this may include tomatoes and tomato products, chili peppers, onions and garlic, and horseradish. Grains Pastries or quick breads with added fat. Meats and other proteins High-fat meats, such as fatty beef or pork, hot dogs, ribs, ham, sausage, salami, and bacon. Fried meat or protein, including fried fish and fried chicken. Nuts and nut butters, in large amounts. Dairy Whole milk and chocolate milk. Sour cream. Cream. Ice cream. Cream cheese. Milkshakes. Fats and oils Butter. Margarine. Shortening. Ghee. Beverages Coffee and tea, with or without caffeine. Carbonated beverages. Sodas. Energy drinks. Fruit juice made with acidic fruits, such as orange or grapefruit. Tomato juice. Alcoholic drinks. Sweets and desserts Chocolate and cocoa. Donuts. Seasonings and condiments Pepper. Peppermint and spearmint. Added salt. Any condiments, herbs, or seasonings that cause symptoms. For some people, this may include curry, hot sauce, or vinegar-based salad dressings. The items listed above may not be a complete list of what you should not eat and drink. Contact a food expert for more options. Questions to ask your doctor Diet and lifestyle changes are often the first steps that are taken to manage symptoms of GERD. If diet and lifestyle changes do not help, talk with your doctor about taking medicines. Where to find more information  International Foundation for  Gastrointestinal Disorders: aboutgerd.org Summary  When you have GERD, food and lifestyle choices are very important in easing your symptoms.  Eat small meals often instead of 3 large meals a day. Eat your meals slowly and in a place where you are relaxed.  Avoid bending over or lying down until 2-3 hours after eating.  Limit high-fat foods such as fatty meats or fried foods. This information is not intended to replace advice given to you by your health care provider. Make sure you discuss any questions you have with your health care provider. Document Revised: 07/03/2019 Document Reviewed: 07/03/2019 Elsevier Patient Education  [place].

## 2020-04-26 ENCOUNTER — Ambulatory Visit (INDEPENDENT_AMBULATORY_CARE_PROVIDER_SITE_OTHER): Payer: Medicare Other | Admitting: Nurse Practitioner

## 2020-04-26 ENCOUNTER — Other Ambulatory Visit: Payer: Self-pay

## 2020-04-26 ENCOUNTER — Encounter: Payer: Self-pay | Admitting: Nurse Practitioner

## 2020-04-26 VITALS — BP 111/78 | HR 73 | Temp 97.7°F | Resp 20 | Ht 63.0 in | Wt 183.0 lb

## 2020-04-26 DIAGNOSIS — I10 Essential (primary) hypertension: Secondary | ICD-10-CM | POA: Diagnosis not present

## 2020-04-26 DIAGNOSIS — R609 Edema, unspecified: Secondary | ICD-10-CM

## 2020-04-26 MED ORDER — FUROSEMIDE 20 MG PO TABS: 20.0000 mg | ORAL_TABLET | Freq: Every day | ORAL | 1 refills | Status: DC

## 2020-04-26 MED ORDER — LISINOPRIL 10 MG PO TABS: 10.0000 mg | ORAL_TABLET | Freq: Every day | ORAL | 1 refills | Status: DC

## 2020-04-26 NOTE — Patient Instructions (Signed)

## 2020-04-26 NOTE — Progress Notes (Signed)
   Subjective:    Patient ID: Brianna [name], female    DOB: 01-18-1960, 60 y.o.   MRN: [id number]   Chief Complaint: bilateral legs swelling   HPI Patient comes in today with  bil lower ext swelling. She went [place] last week and that is when her legs started swelling. Has not gone down since she got back. Denies SOB or Chest pain. BP Readings from Last 3 Encounters:  04/26/20 111/78  03/04/20 112/81  01/19/20 109/78    Review of Systems  Constitutional: Negative.   Respiratory: Negative for cough and shortness of breath.   Cardiovascular: Positive for leg swelling. Negative for chest pain and palpitations.  Genitourinary: Negative.   Neurological: Negative.   Psychiatric/Behavioral: Negative.   All other systems reviewed and are negative.      Objective:   Physical Exam Vitals and nursing note reviewed.  Constitutional:      Appearance: Normal appearance.  Cardiovascular:     Rate and Rhythm: Normal rate and regular rhythm.     Heart sounds: Normal heart sounds.  Pulmonary:     Effort: Pulmonary effort is normal.     Breath sounds: Normal breath sounds.  Skin:    General: Skin is warm and dry.  Neurological:     General: No focal deficit present.     Mental Status: She is alert and oriented to person, place, and time.  Psychiatric:        Mood and Affect: Mood normal.        Behavior: Behavior normal.     BP 111/78   Pulse 73   Temp 97.7 F (36.5 C) (Temporal)   Resp 20   Ht $R'5\' 3"'Au$  (1.6 m)   Wt 183 lb (83 kg)   LMP 04/14/2012   SpO2 97%   BMI 32.42 kg/m        Assessment & Plan:  .YOLANDRA HABIG comes in today with chief complaint of bilateral legs swelling   Diagnosis and orders addressed:  1. Essential hypertension, benign Low sodium diet - lisinopril (ZESTRIL) 10 MG tablet; Take 1 tablet (10 mg total) by mouth daily.  Dispense: 90 tablet; Refill: 1 - CMP14+EGFR  2. Peripheral edema Compression socks Elevate legs when  sitting - furosemide (LASIX) 20 MG tablet; Take 1 tablet (20 mg total) by mouth daily.  Dispense: 90 tablet; Refill: 1   Labs pending Health Maintenance reviewed Diet and exercise encouraged  Follow up plan: Prn and 3 month   Mary-Margaret Hassell Done, FNP

## 2020-04-27 LAB — CMP14+EGFR
ALT: 25 IU/L (ref 0–32)
AST: 20 IU/L (ref 0–40)
Albumin/Globulin Ratio: 1.4 (ref 1.2–2.2)
Albumin: 3.9 g/dL (ref 3.8–4.9)
Alkaline Phosphatase: 120 IU/L (ref 44–121)
BUN/Creatinine Ratio: 17 (ref 9–23)
BUN: 18 mg/dL (ref 6–24)
Bilirubin Total: <0.2 mg/dL (ref 0.0–1.2)
CO2: 24 mmol/L (ref 20–29)
Calcium: 9.3 mg/dL (ref 8.7–10.2)
Chloride: 102 mmol/L (ref 96–106)
Creatinine, Ser: 1.05 mg/dL — ABNORMAL HIGH (ref 0.57–1.00)
Globulin, Total: 2.8 g/dL (ref 1.5–4.5)
Glucose: 83 mg/dL (ref 65–99)
Potassium: 4.2 mmol/L (ref 3.5–5.2)
Sodium: 142 mmol/L (ref 134–144)
Total Protein: 6.7 g/dL (ref 6.0–8.5)
eGFR: 61 mL/min/{1.73_m2} (ref >59)

## 2020-04-30 ENCOUNTER — Telehealth: Payer: Self-pay | Admitting: Family Medicine

## 2020-04-30 NOTE — Telephone Encounter (Signed)
Patient aware.

## 2020-04-30 NOTE — Telephone Encounter (Signed)
Please review and advise.

## 2020-04-30 NOTE — Telephone Encounter (Signed)
Pt was seen for swelling and MMM prescribed fluid pill (lasix) but pt said that they are not working. Please advise.

## 2020-04-30 NOTE — Telephone Encounter (Signed)
I would say the best thing is for her to get some compression stockings, if she still having issues with that then come in and see me and we can recheck some things but increasing the Lasix without testing her kidneys again would not be a good idea

## 2020-05-10 DIAGNOSIS — R7401 Elevation of levels of liver transaminase levels: Secondary | ICD-10-CM | POA: Diagnosis not present

## 2020-05-11 LAB — HEPATIC FUNCTION PANEL
AG Ratio: 1.6 (calc) (ref 1.0–2.5)
ALT: 20 U/L (ref 6–29)
AST: 19 U/L (ref 10–35)
Albumin: 4.1 g/dL (ref 3.6–5.1)
Alkaline phosphatase (APISO): 88 U/L (ref 37–153)
Bilirubin, Direct: 0.1 mg/dL (ref 0.0–0.2)
Globulin: 2.5 g/dL (calc) (ref 1.9–3.7)
Indirect Bilirubin: 0.4 mg/dL (calc) (ref 0.2–1.2)
Total Bilirubin: 0.5 mg/dL (ref 0.2–1.2)
Total Protein: 6.6 g/dL (ref 6.1–8.1)

## 2020-05-21 ENCOUNTER — Encounter: Payer: Self-pay | Admitting: *Deleted

## 2020-06-13 ENCOUNTER — Other Ambulatory Visit: Payer: Self-pay | Admitting: Family Medicine

## 2020-06-13 DIAGNOSIS — F419 Anxiety disorder, unspecified: Secondary | ICD-10-CM

## 2020-06-13 DIAGNOSIS — I10 Essential (primary) hypertension: Secondary | ICD-10-CM

## 2020-06-13 DIAGNOSIS — F32A Depression, unspecified: Secondary | ICD-10-CM

## 2020-07-16 ENCOUNTER — Other Ambulatory Visit: Payer: Self-pay | Admitting: Internal Medicine

## 2020-07-16 ENCOUNTER — Other Ambulatory Visit: Payer: Self-pay | Admitting: Nurse Practitioner

## 2020-07-16 ENCOUNTER — Other Ambulatory Visit: Payer: Self-pay | Admitting: Family Medicine

## 2020-07-16 DIAGNOSIS — I10 Essential (primary) hypertension: Secondary | ICD-10-CM

## 2020-07-16 DIAGNOSIS — R609 Edema, unspecified: Secondary | ICD-10-CM

## 2020-07-26 ENCOUNTER — Ambulatory Visit: Payer: Self-pay | Admitting: Family Medicine

## 2020-08-15 ENCOUNTER — Ambulatory Visit: Payer: Self-pay | Admitting: Family Medicine

## 2020-08-21 ENCOUNTER — Encounter: Payer: Self-pay | Admitting: Family Medicine

## 2020-09-02 ENCOUNTER — Ambulatory Visit: Payer: Medicare Other | Admitting: Gastroenterology

## 2020-09-02 ENCOUNTER — Encounter: Payer: Self-pay | Admitting: Internal Medicine

## 2020-09-04 ENCOUNTER — Other Ambulatory Visit: Payer: Self-pay | Admitting: Family Medicine

## 2020-09-04 DIAGNOSIS — K219 Gastro-esophageal reflux disease without esophagitis: Secondary | ICD-10-CM

## 2020-09-17 ENCOUNTER — Ambulatory Visit (INDEPENDENT_AMBULATORY_CARE_PROVIDER_SITE_OTHER): Payer: Medicare Other

## 2020-09-17 ENCOUNTER — Ambulatory Visit (INDEPENDENT_AMBULATORY_CARE_PROVIDER_SITE_OTHER): Payer: Medicare Other | Admitting: Family Medicine

## 2020-09-17 ENCOUNTER — Encounter: Payer: Self-pay | Admitting: Family Medicine

## 2020-09-17 ENCOUNTER — Other Ambulatory Visit: Payer: Self-pay

## 2020-09-17 VITALS — BP 101/74 | HR 82 | Temp 99.3°F | Ht 63.0 in | Wt 188.6 lb

## 2020-09-17 DIAGNOSIS — R0602 Shortness of breath: Secondary | ICD-10-CM

## 2020-09-17 DIAGNOSIS — R601 Generalized edema: Secondary | ICD-10-CM

## 2020-09-17 DIAGNOSIS — J449 Chronic obstructive pulmonary disease, unspecified: Secondary | ICD-10-CM | POA: Diagnosis not present

## 2020-09-17 DIAGNOSIS — R059 Cough, unspecified: Secondary | ICD-10-CM | POA: Diagnosis not present

## 2020-09-17 NOTE — Progress Notes (Signed)
Subjective:  Patient ID: Brianna [name], female    DOB: October 09, 1960  Age: 60 y.o. MRN: [id number]  CC: Edema   HPI Brianna [name] presents for onset in April. Legs swelling a lot while walking a lot through [employer]. Got fluid pills that helped. Last week went to [place]. Walkd a lot. Legs swelled a lot and got dyspneic also. Abd feels tight. Gets had with eating. COncerned that her Mpm swelled from CHF.   Depression screen [facility] 2/9 09/17/2020 04/26/2020 01/19/2020  Decreased Interest 0 0 0  Down, Depressed, Hopeless 0 0 1  PHQ - 2 Score 0 0 1  Altered sleeping 1 - -  Tired, decreased energy 3 - -  Change in appetite 1 - -  Feeling bad or failure about yourself  0 - -  Trouble concentrating 0 - -  Moving slowly or fidgety/restless 0 - -  Suicidal thoughts 0 - -  PHQ-9 Score 5 - -  Difficult doing work/chores Somewhat difficult - -  Some recent data might be hidden    History Brianna [name] has a past medical history of Anemia, Anxiety, Arthritis, Chronic back pain, Chronic neck pain, Chronic right hip pain (since 09/2012), DDD (degenerative disc disease), cervical, Depression, Diverticulitis, Fibromyalgia, GERD (gastroesophageal reflux disease), Headache, Hypertension, and Pancreatitis.   She has a past surgical history that includes Cesarean section; Oophorectomy; Colonoscopy (N/A, 08/22/2013); Esophagogastroduodenoscopy (N/A, 08/22/2013); Cholecystectomy (N/A, 08/16/2014); Esophagogastroduodenoscopy (egd) with propofol (N/A, 01/04/2020); and maloney dilation (N/A, 01/04/2020).   Her family history includes Gallbladder disease in her son; Ovarian cancer in her mother.She reports that she quit smoking about 13 years ago. Her smoking use included cigarettes. She has a 1.00 pack-year smoking history. She has never used smokeless tobacco. She reports that she does not drink alcohol and does not use drugs.    ROS Review of Systems  Constitutional: Negative.   HENT: Negative.    Eyes:   Negative for visual disturbance.  Respiratory:  Positive for shortness of breath.   Cardiovascular:  Negative for chest pain.  Gastrointestinal:  Negative for abdominal pain.  Musculoskeletal:  Negative for arthralgias.   Objective:  BP 101/74   Pulse 82   Temp 99.3 F (37.4 C)   Ht _0  (1.6 m)   Wt 188 lb 9.6 oz (85.5 kg)   LMP 04/14/2012   SpO2 98%   BMI 33.41 kg/m   BP Readings from Last 3 Encounters:  09/17/20 101/74  04/26/20 111/78  03/04/20 112/81    Wt Readings from Last 3 Encounters:  09/17/20 188 lb 9.6 oz (85.5 kg)  04/26/20 183 lb (83 kg)  03/04/20 185 lb 9.6 oz (84.2 kg)     Physical Exam Constitutional:      General: She is not in acute distress.    Appearance: She is well-developed.  Cardiovascular:     Rate and Rhythm: Normal rate and regular rhythm.  Pulmonary:     Breath sounds: Normal breath sounds.  Musculoskeletal:        General: Swelling present. Normal range of motion.  Skin:    General: Skin is warm and dry.  Neurological:     Mental Status: She is alert and oriented to person, place, and time.      Assessment & Plan:   Brianna [name] was seen today for edema.  Diagnoses and all orders for this visit:  Generalized edema -     DG Chest 2 View; Future -     CBC with Differential/Platelet -  CMP14+EGFR -     Pro b natriuretic peptide (BNP)  Shortness of breath -     DG Chest 2 View; Future -     CBC with Differential/Platelet -     CMP14+EGFR -     Pro b natriuretic peptide (BNP)      I am having Brianna [name] maintain her fluticasone, aspirin-acetaminophen-caffeine, pregabalin, loratadine, citalopram, atorvastatin, famotidine, furosemide, lisinopril, and pantoprazole.  Allergies as of 09/17/2020       Reactions   Tylenol With Codeine #3 [acetaminophen-codeine] Other (See Comments)   headache        Medication List        Accurate as of September 17, 2020 11:59 PM. If you have any questions, ask your  nurse or doctor.          aspirin-acetaminophen-caffeine 250-250-65 MG tablet Commonly known as: EXCEDRIN MIGRAINE Take 2 tablets by mouth every 8 (eight) hours as needed for headache.   atorvastatin 20 MG tablet Commonly known as: LIPITOR TAKE ONE TABLET BY MOUTH DAILY   citalopram 40 MG tablet Commonly known as: CELEXA Take 1 tablet (40 mg total) by mouth daily.   famotidine 40 MG tablet Commonly known as: PEPCID TAKE ONE TABLET BY MOUTH AT BEDTIME   fluticasone 50 MCG/ACT nasal spray Commonly known as: FLONASE Place 1 spray into both nostrils 2 (two) times daily as needed for allergies or rhinitis.   furosemide 20 MG tablet Commonly known as: LASIX TAKE ONE TABLET BY MOUTH ONCE DAILY   lisinopril 10 MG tablet Commonly known as: ZESTRIL TAKE ONE TABLET BY MOUTH ONCE DAILY   loratadine 10 MG tablet Commonly known as: CLARITIN TAKE ONE TABLET BY MOUTH DAILY   pantoprazole 40 MG tablet Commonly known as: PROTONIX TAKE ONE TABLET BY MOUTH TWICE DAILY   pregabalin 100 MG capsule Commonly known as: LYRICA Take 1 capsule (100 mg total) by mouth 2 (two) times daily.         Follow-up: Return if symptoms worsen or fail to improve.  Brianna [name], M.D.

## 2020-09-18 ENCOUNTER — Telehealth: Payer: Self-pay | Admitting: Family Medicine

## 2020-09-18 ENCOUNTER — Encounter: Payer: Self-pay | Admitting: Family Medicine

## 2020-09-18 LAB — CMP14+EGFR
ALT: 20 IU/L (ref 0–32)
AST: 21 IU/L (ref 0–40)
Albumin/Globulin Ratio: 1.6 (ref 1.2–2.2)
Albumin: 4.2 g/dL (ref 3.8–4.9)
Alkaline Phosphatase: 110 IU/L (ref 44–121)
BUN/Creatinine Ratio: 12 (ref 9–23)
BUN: 12 mg/dL (ref 6–24)
Bilirubin Total: 0.4 mg/dL (ref 0.0–1.2)
CO2: 24 mmol/L (ref 20–29)
Calcium: 9.4 mg/dL (ref 8.7–10.2)
Chloride: 102 mmol/L (ref 96–106)
Creatinine, Ser: 0.98 mg/dL (ref 0.57–1.00)
Globulin, Total: 2.7 g/dL (ref 1.5–4.5)
Glucose: 87 mg/dL (ref 65–99)
Potassium: 4.5 mmol/L (ref 3.5–5.2)
Sodium: 141 mmol/L (ref 134–144)
Total Protein: 6.9 g/dL (ref 6.0–8.5)
eGFR: 66 mL/min/{1.73_m2} (ref >59)

## 2020-09-18 LAB — CBC WITH DIFFERENTIAL/PLATELET
Basophils Absolute: 0 10*3/uL (ref 0.0–0.2)
Basos: 1 %
EOS (ABSOLUTE): 0.1 10*3/uL (ref 0.0–0.4)
Eos: 2 %
Hematocrit: 39.4 % (ref 34.0–46.6)
Hemoglobin: 13 g/dL (ref 11.1–15.9)
Immature Grans (Abs): 0 10*3/uL (ref 0.0–0.1)
Immature Granulocytes: 0 %
Lymphocytes Absolute: 2.4 10*3/uL (ref 0.7–3.1)
Lymphs: 45 %
MCH: 28.5 pg (ref 26.6–33.0)
MCHC: 33 g/dL (ref 31.5–35.7)
MCV: 86 fL (ref 79–97)
Monocytes Absolute: 0.4 10*3/uL (ref 0.1–0.9)
Monocytes: 8 %
Neutrophils Absolute: 2.3 10*3/uL (ref 1.4–7.0)
Neutrophils: 44 %
Platelets: 357 10*3/uL (ref 150–450)
RBC: 4.56 x10E6/uL (ref 3.77–5.28)
RDW: 12.7 % (ref 11.7–15.4)
WBC: 5.3 10*3/uL (ref 3.4–10.8)

## 2020-09-18 LAB — PRO B NATRIURETIC PEPTIDE: NT-Pro BNP: 276 pg/mL (ref 0–287)

## 2020-09-18 NOTE — Telephone Encounter (Signed)
Pt seen yesterday Brianna [name] says that he told her he would send in abx for ear infection. Use [facility] pharmacy.

## 2020-09-18 NOTE — Progress Notes (Signed)
Hello Fayne,  Your lab result is normal and/or stable.Some minor variations that are not significant are commonly marked abnormal, but do not represent any medical problem for you.  Best regards, Claretta Fraise, M.D.

## 2020-09-19 ENCOUNTER — Other Ambulatory Visit: Payer: Self-pay | Admitting: Family Medicine

## 2020-09-19 MED ORDER — CEFPROZIL 500 MG PO TABS: 500.0000 mg | ORAL_TABLET | Freq: Two times a day (BID) | ORAL | 0 refills | Status: DC

## 2020-09-19 NOTE — Telephone Encounter (Signed)
Please let the patient know that I sent their prescription to their pharmacy. Thanks, WS 

## 2020-09-27 ENCOUNTER — Ambulatory Visit: Payer: Medicare Other | Admitting: Family Medicine

## 2020-10-16 ENCOUNTER — Ambulatory Visit (INDEPENDENT_AMBULATORY_CARE_PROVIDER_SITE_OTHER): Payer: Medicare Other | Admitting: Family Medicine

## 2020-10-16 ENCOUNTER — Other Ambulatory Visit: Payer: Self-pay

## 2020-10-16 ENCOUNTER — Encounter: Payer: Self-pay | Admitting: Family Medicine

## 2020-10-16 VITALS — BP 109/77 | HR 75 | Ht 63.0 in | Wt 186.0 lb

## 2020-10-16 DIAGNOSIS — R609 Edema, unspecified: Secondary | ICD-10-CM

## 2020-10-16 DIAGNOSIS — F32A Depression, unspecified: Secondary | ICD-10-CM

## 2020-10-16 DIAGNOSIS — K2100 Gastro-esophageal reflux disease with esophagitis, without bleeding: Secondary | ICD-10-CM | POA: Diagnosis not present

## 2020-10-16 DIAGNOSIS — Z1231 Encounter for screening mammogram for malignant neoplasm of breast: Secondary | ICD-10-CM

## 2020-10-16 DIAGNOSIS — I10 Essential (primary) hypertension: Secondary | ICD-10-CM

## 2020-10-16 DIAGNOSIS — M7551 Bursitis of right shoulder: Secondary | ICD-10-CM | POA: Diagnosis not present

## 2020-10-16 DIAGNOSIS — F419 Anxiety disorder, unspecified: Secondary | ICD-10-CM | POA: Diagnosis not present

## 2020-10-16 MED ORDER — LORATADINE 10 MG PO TABS: 10.0000 mg | ORAL_TABLET | Freq: Every day | ORAL | 3 refills | Status: DC

## 2020-10-16 MED ORDER — FUROSEMIDE 20 MG PO TABS: 20.0000 mg | ORAL_TABLET | Freq: Every day | ORAL | 3 refills | Status: DC

## 2020-10-16 MED ORDER — ATORVASTATIN CALCIUM 20 MG PO TABS: 20.0000 mg | ORAL_TABLET | Freq: Every day | ORAL | 3 refills | Status: DC

## 2020-10-16 MED ORDER — PREGABALIN 100 MG PO CAPS
100.0000 mg | ORAL_CAPSULE | Freq: Two times a day (BID) | ORAL | 5 refills | Status: DC
Start: 2020-10-16 — End: 2021-06-17

## 2020-10-16 MED ORDER — CITALOPRAM HYDROBROMIDE 40 MG PO TABS: 40.0000 mg | ORAL_TABLET | Freq: Every day | ORAL | 0 refills | Status: DC

## 2020-10-16 MED ORDER — LISINOPRIL 10 MG PO TABS: 10.0000 mg | ORAL_TABLET | Freq: Every day | ORAL | 3 refills | Status: DC
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
## 2020-10-16 NOTE — Addendum Note (Signed)
Addended by: Caryl Pina on: 10/16/2020 12:07 PM   Modules accepted: Orders

## 2020-10-17 LAB — LIPID PANEL
Chol/HDL Ratio: 2.4 ratio (ref 0.0–4.4)
Cholesterol, Total: 159 mg/dL (ref 100–199)
HDL: 65 mg/dL (ref >39)
LDL Chol Calc (NIH): 74 mg/dL (ref 0–99)
Triglycerides: 110 mg/dL (ref 0–149)
VLDL Cholesterol Cal: 20 mg/dL (ref 5–40)

## 2020-10-17 LAB — CBC WITH DIFFERENTIAL/PLATELET
Basophils Absolute: 0.1 10*3/uL (ref 0.0–0.2)
Basos: 1 %
EOS (ABSOLUTE): 0.1 10*3/uL (ref 0.0–0.4)
Eos: 2 %
Hematocrit: 40.4 % (ref 34.0–46.6)
Hemoglobin: 13.6 g/dL (ref 11.1–15.9)
Immature Grans (Abs): 0 10*3/uL (ref 0.0–0.1)
Immature Granulocytes: 0 %
Lymphocytes Absolute: 3.2 10*3/uL — ABNORMAL HIGH (ref 0.7–3.1)
Lymphs: 46 %
MCH: 29.1 pg (ref 26.6–33.0)
MCHC: 33.7 g/dL (ref 31.5–35.7)
MCV: 86 fL (ref 79–97)
Monocytes Absolute: 0.6 10*3/uL (ref 0.1–0.9)
Monocytes: 9 %
Neutrophils Absolute: 2.9 10*3/uL (ref 1.4–7.0)
Neutrophils: 42 %
Platelets: 333 10*3/uL (ref 150–450)
RBC: 4.68 x10E6/uL (ref 3.77–5.28)
RDW: 12.7 % (ref 11.7–15.4)
WBC: 6.8 10*3/uL (ref 3.4–10.8)

## 2020-10-17 LAB — CMP14+EGFR
ALT: 40 IU/L — ABNORMAL HIGH (ref 0–32)
AST: 27 IU/L (ref 0–40)
Albumin/Globulin Ratio: 1.7 (ref 1.2–2.2)
Albumin: 4.5 g/dL (ref 3.8–4.9)
Alkaline Phosphatase: 146 IU/L — ABNORMAL HIGH (ref 44–121)
BUN/Creatinine Ratio: 22 (ref 9–23)
BUN: 17 mg/dL (ref 6–24)
Bilirubin Total: 0.6 mg/dL (ref 0.0–1.2)
CO2: 23 mmol/L (ref 20–29)
Calcium: 9.7 mg/dL (ref 8.7–10.2)
Chloride: 102 mmol/L (ref 96–106)
Creatinine, Ser: 0.79 mg/dL (ref 0.57–1.00)
Globulin, Total: 2.6 g/dL (ref 1.5–4.5)
Glucose: 84 mg/dL (ref 70–99)
Potassium: 4.5 mmol/L (ref 3.5–5.2)
Sodium: 139 mmol/L (ref 134–144)
Total Protein: 7.1 g/dL (ref 6.0–8.5)
eGFR: 86 mL/min/{1.73_m2} (ref >59)

## 2020-10-17 LAB — TSH: TSH: 2.53 u[IU]/mL (ref 0.450–4.500)

## 2020-10-21 ENCOUNTER — Encounter: Payer: Self-pay | Admitting: Radiology

## 2020-10-30 ENCOUNTER — Ambulatory Visit: Payer: Medicare Other | Admitting: Orthopedic Surgery

## 2020-11-05 ENCOUNTER — Encounter: Payer: Self-pay | Admitting: Orthopedic Surgery

## 2020-11-05 ENCOUNTER — Ambulatory Visit: Payer: Medicare Other | Admitting: Orthopedic Surgery

## 2020-11-18 ENCOUNTER — Encounter: Payer: Self-pay | Admitting: Orthopedic Surgery

## 2020-11-18 ENCOUNTER — Ambulatory Visit (INDEPENDENT_AMBULATORY_CARE_PROVIDER_SITE_OTHER): Payer: Medicare Other | Admitting: Orthopedic Surgery

## 2020-11-18 ENCOUNTER — Other Ambulatory Visit: Payer: Self-pay

## 2020-11-18 ENCOUNTER — Ambulatory Visit: Payer: Medicare Other

## 2020-11-18 VITALS — BP 112/76 | HR 69 | Ht 63.0 in | Wt 185.0 lb

## 2020-11-18 DIAGNOSIS — M7581 Other shoulder lesions, right shoulder: Secondary | ICD-10-CM

## 2020-11-18 DIAGNOSIS — M25511 Pain in right shoulder: Secondary | ICD-10-CM

## 2020-11-18 DIAGNOSIS — G8929 Other chronic pain: Secondary | ICD-10-CM

## 2020-11-18 NOTE — Progress Notes (Signed)
New Patient Visit  Assessment: Brianna [name] is a 60 y.o. female with the following: 1. Tendinitis of right rotator cuff; atraumatic, possible tear  Plan: Patient has pain and slightly restricted range of motion in her right shoulder.  Radiographs are negative.  Atraumatic onset.  Low concern for frozen shoulder at this time.  Presentation most consistent with irritation of the rotator cuff tendons, as well as the proximal biceps tendon.  It is possible that she is to have sustained an atraumatic injury to the rotator cuff.  Based on the description, she has received multiple subacromial injections in the past.  I have recommended a fluoroscopic guided right glenohumeral joint injection, to be completed at [facility]  Orders were placed today.  This will be scheduled in the future.  Depending on the efficacy of the injection, may consider an MRI in the future.  Follow-up as needed.   Follow-up: Return if symptoms worsen or fail to improve.  Subjective:  Chief Complaint  Patient presents with   Shoulder Pain    Right shoulder pain    History of Present Illness: Brianna [name] is a 60 y.o. female who has been referred to clinic today by Caryl Pina, MD for evaluation of right shoulder pain.  She has had pain in her right shoulder for greater than 1 year.  She denies a specific onset.  She states she has been taking care of her father for a while, until he passed a couple of months ago.  She states that this is not helped her shoulder pain.  She has tried physical therapy.  She has had multiple injections in the subacromial space.  Last injection was greater than 6 months ago.  Injections have provided limited sustained relief.  She is tried over-the-counter pain medications for her shoulder.  Pain gets worse at night.  She has radiating pains from her shoulder into the   Review of Systems: No fevers or chills No numbness or tingling No chest pain No shortness of  breath No bowel or bladder dysfunction No GI distress No headaches   Medical History:  Past Medical History:  Diagnosis Date   Anemia    Anxiety    Arthritis    Chronic back pain    Chronic neck pain    Chronic right hip pain since 09/2012   DDD (degenerative disc disease), cervical    Depression    Diverticulitis    Fibromyalgia    GERD (gastroesophageal reflux disease)    Headache    Hypertension    Pancreatitis     Past Surgical History:  Procedure Laterality Date   CESAREAN SECTION     CHOLECYSTECTOMY N/A 08/16/2014   Procedure: LAPAROSCOPIC CHOLECYSTECTOMY WITH CHOLANGIOGRAM;  Surgeon: Sherri Rad, MD;  Location: [facility] ORS;  Service: General;  Laterality: N/A;   COLONOSCOPY N/A 08/22/2013   NOB:SJGGEZM diverticulosis paper hemtochezia from anorectal irritation   ESOPHAGOGASTRODUODENOSCOPY N/A 08/22/2013   OQH:UTMLYYT reflux esophagitis/noncritical schatzki's ring and hiatial hernia   ESOPHAGOGASTRODUODENOSCOPY (EGD) WITH PROPOFOL N/A 01/04/2020     Surgeon: Daneil Dolin, MD; Erosive reflux esophagitis s/p dilation, medium-sized hiatal hernia, normal examined duodenum.   MALONEY DILATION N/A 01/04/2020   Procedure: Venia Minks DILATION;  Surgeon: Daneil Dolin, MD;  Location: [facility];  Service: Endoscopy;  Laterality: N/A;   OOPHORECTOMY      Family History  Problem Relation Age of Onset   Ovarian cancer Mother    Gallbladder disease Son    Colon  cancer Neg Hx    Social History   Tobacco Use   Smoking status: Former    Packs/day: 0.50    Years: 2.00    Pack years: 1.00    Types: Cigarettes    Quit date: 01/06/2007    Years since quitting: 13.8   Smokeless tobacco: Never  Vaping Use   Vaping Use: Never used  Substance Use Topics   Alcohol use: No   Drug use: No    Allergies  Allergen Reactions   Tylenol With Codeine #3 [Acetaminophen-Codeine] Other (See Comments)    headache    Current Meds  Medication Sig   aspirin-acetaminophen-caffeine  (EXCEDRIN MIGRAINE) 250-250-65 MG tablet Take 2 tablets by mouth every 8 (eight) hours as needed for headache.   atorvastatin (LIPITOR) 20 MG tablet Take 1 tablet (20 mg total) by mouth daily.   citalopram (CELEXA) 40 MG tablet Take 1 tablet (40 mg total) by mouth daily.   famotidine (PEPCID) 40 MG tablet TAKE ONE TABLET BY MOUTH AT BEDTIME   fluticasone (FLONASE) 50 MCG/ACT nasal spray Place 1 spray into both nostrils 2 (two) times daily as needed for allergies or rhinitis.   furosemide (LASIX) 20 MG tablet Take 1 tablet (20 mg total) by mouth daily.   lisinopril (ZESTRIL) 10 MG tablet Take 1 tablet (10 mg total) by mouth daily.   loratadine (CLARITIN) 10 MG tablet Take 1 tablet (10 mg total) by mouth daily.   pantoprazole (PROTONIX) 40 MG tablet TAKE ONE TABLET BY MOUTH TWICE DAILY   pregabalin (LYRICA) 100 MG capsule Take 1 capsule (100 mg total) by mouth 2 (two) times daily.    Objective: BP 112/76   Pulse 69   Ht 5\' 3"  (1.6 m)   Wt 185 lb (83.9 kg)   LMP 04/14/2012   BMI 32.77 kg/m   Physical Exam:  General: Alert and oriented. and No acute distress. Gait: Normal gait.  Right shoulder without deformity.  No swelling is appreciated.  No bruising is appreciated.  Slightly restricted forward elevation and abduction.  Internal rotation of the lumbar spine.  External rotation at her side is approximately equal to the contralateral side.  4/5 supraspinatus, infraspinatus testing.  She has some pain with strength testing.  Negative belly press.  Fingers warm and well-perfused.  IMAGING: I personally ordered and reviewed the following images  X-ray of the right shoulder was obtained in clinic today and demonstrates no acute injuries.  There is no evidence of proximal humeral migration.  Glenohumeral joint space is well-maintained.  Degenerative changes noted at the [facility] joint.  Impression: Right shoulder x-rays with moderate degenerative changes at the [facility] joint.   New Medications:  No  orders of the defined types were placed in this encounter.     Mordecai Rasmussen, MD  11/18/2020 10:39 PM

## 2020-11-25 ENCOUNTER — Encounter (HOSPITAL_COMMUNITY): Payer: Self-pay

## 2020-11-25 ENCOUNTER — Ambulatory Visit (HOSPITAL_COMMUNITY)
Admission: RE | Admit: 2020-11-25 | Discharge: 2020-11-25 | Disposition: A | Discharge status: Home / Self Care | Payer: Medicare Other | Source: Ambulatory Visit | Attending: Orthopedic Surgery | Admitting: Orthopedic Surgery

## 2020-11-25 ENCOUNTER — Other Ambulatory Visit: Payer: Self-pay

## 2020-11-25 DIAGNOSIS — M7581 Other shoulder lesions, right shoulder: Secondary | ICD-10-CM | POA: Diagnosis not present

## 2020-11-25 DIAGNOSIS — M7521 Bicipital tendinitis, right shoulder: Secondary | ICD-10-CM | POA: Diagnosis not present

## 2020-11-25 DIAGNOSIS — M75121 Complete rotator cuff tear or rupture of right shoulder, not specified as traumatic: Secondary | ICD-10-CM | POA: Diagnosis not present

## 2020-11-25 MED ORDER — POVIDONE-IODINE 10 % EX SOLN
CUTANEOUS | Status: AC
  Administered 2020-11-25: 1
  Filled 2020-11-25: qty 15

## 2020-11-25 MED ORDER — BUPIVACAINE HCL (PF) 0.5 % IJ SOLN
INTRAMUSCULAR | Status: AC
  Administered 2020-11-25: 5 mL
  Filled 2020-11-25: qty 30

## 2020-11-25 MED ORDER — METHYLPREDNISOLONE ACETATE 40 MG/ML IJ SUSP
INTRAMUSCULAR | Status: AC
  Administered 2020-11-25: 40 mg
  Filled 2020-11-25: qty 1

## 2020-11-25 MED ORDER — IOHEXOL 180 MG/ML  SOLN
20.0000 mL | Freq: Once | INTRAMUSCULAR | Status: AC | PRN
  Administered 2020-11-25: 2 mL via INTRA_ARTICULAR

## 2020-11-25 MED ORDER — LIDOCAINE HCL (PF) 1 % IJ SOLN
INTRAMUSCULAR | Status: AC
  Administered 2020-11-25: 5 mL
  Filled 2020-11-25: qty 5

## 2020-11-25 NOTE — Procedures (Signed)
Preprocedure Dx: Pain, tendonitis RT rotator cuff Postprocedure Dx: Pain, tendonitis RT rotator cuff Procedure  Fluoroscopically guided RIGHT shoulder joint therapeutic injection Radiologist:  Thornton Papas Anesthesia:  3 ml of 1% lidocaine Injectate:  3 ml Sensorcaine 0.5%, depoMedrol 40 mg Fluoro time:  0 minutes 42 seconds EBL:   None Complications: None

## 2020-12-02 ENCOUNTER — Telehealth: Payer: Self-pay | Admitting: Family Medicine

## 2020-12-02 NOTE — Telephone Encounter (Signed)
  Left message for patient to call back and schedule Medicare Annual Wellness Visit (AWV) to be completed by video or phone.  No hx of AWV eligible for AWVI as of 02/06/2016 per [employer]   Please schedule at anytime with [place] --- Karle Starch  45 Minutes appointment   Any questions, please call me at [phone]

## 2020-12-05 ENCOUNTER — Telehealth: Payer: Self-pay | Admitting: Family Medicine

## 2020-12-25 ENCOUNTER — Telehealth: Payer: Self-pay | Admitting: Family Medicine

## 2021-01-09 ENCOUNTER — Ambulatory Visit (INDEPENDENT_AMBULATORY_CARE_PROVIDER_SITE_OTHER): Payer: Commercial Managed Care - HMO | Admitting: Nurse Practitioner

## 2021-01-09 ENCOUNTER — Encounter: Payer: Self-pay | Admitting: Nurse Practitioner

## 2021-01-09 DIAGNOSIS — J069 Acute upper respiratory infection, unspecified: Secondary | ICD-10-CM | POA: Diagnosis not present

## 2021-01-09 DIAGNOSIS — J019 Acute sinusitis, unspecified: Secondary | ICD-10-CM

## 2021-01-09 LAB — VERITOR FLU A/B WAIVED
Influenza A: NEGATIVE
Influenza B: NEGATIVE

## 2021-01-09 MED ORDER — DOXYCYCLINE HYCLATE 100 MG PO TABS: 100.0000 mg | ORAL_TABLET | Freq: Two times a day (BID) | ORAL | 0 refills | Status: DC

## 2021-01-09 NOTE — Patient Instructions (Signed)

## 2021-01-09 NOTE — Progress Notes (Signed)
Virtual Visit  Note Due to COVID-19 pandemic this visit was conducted virtually. This visit type was conducted due to national recommendations for restrictions regarding the COVID-19 Pandemic (e.g. social distancing, sheltering in place) in an effort to limit this patient's exposure and mitigate transmission in our community. All issues noted in this document were discussed and addressed.  A physical exam was not performed with this format.  I connected with Brianna [name] on 01/09/21 at 2:43 by telephone and verified that I am speaking with the correct person using tw:o identifiers. Brianna [name] is currently located at home and no one is currently with her during visit. The provider, Mary-Margaret Hassell Done, FNP is located in their office at time of visit.  I discussed the limitations, risks, security and privacy concerns of performing an evaluation and management service by telephone and the availability of in person appointments. I also discussed with the patient that there may be a patient responsible charge related to this service. The patient expressed understanding and agreed to proceed.   History and Present Illness:  Patient says she developed a headache yesterday. Now she has fever of 100.5 with body aches. She has also developed a sore thorat and congestion.      Review of Systems  Constitutional:  Positive for chills and fever.  HENT:  Positive for congestion and sore throat.   Respiratory:  Positive for cough and sputum production.   Musculoskeletal:  Positive for myalgias.  Neurological:  Positive for headaches.    Observations/Objective: Alert and oriented- answers all questions appropriately No distress Raspy voice  Assessment and Plan: Brianna [name] in today with chief complaint of No chief complaint on file.   1. Acute sinusitis, recurrence not specified, unspecified location - Veritor Flu A/B Waived  2. URI with cough and congestion 1. Take meds  as prescribed 2. Use a cool mist humidifier especially during the winter months and when heat has been humid. 3. Use saline nose sprays frequently 4. Saline irrigations of the nose can be very helpful if done frequently.  * 4X daily for 1 week*  * Use of a nettie pot can be helpful with this. Follow directions with this* 5. Drink plenty of fluids 6. Keep thermostat turn down low 7.For any cough or congestion- mucinex 8. For fever or aces or pains- take tylenol or ibuprofen appropriate for age and weight.  * for fevers greater than 101 orally you may alternate ibuprofen and tylenol every  3 hours.    Meds ordered this encounter  Medications   doxycycline (VIBRA-TABS) 100 MG tablet    Sig: Take 1 tablet (100 mg total) by mouth 2 (two) times daily. 1 po bid    Dispense:  20 tablet    Refill:  0    Order Specific Question:   Supervising Provider    Answer:   Caryl Pina A [[id number]]       Follow Up Instructions: prn    I discussed the assessment and treatment plan with the patient. The patient was provided an opportunity to ask questions and all were answered. The patient agreed with the plan and demonstrated an understanding of the instructions.   The patient was advised to call back or seek an in-person evaluation if the symptoms worsen or if the condition fails to improve as anticipated.  The above assessment and management plan was discussed with the patient. The patient verbalized understanding of and has agreed to the management plan. Patient is aware  to call the clinic if symptoms persist or worsen. Patient is aware when to return to the clinic for a follow-up visit. Patient educated on when it is appropriate to go to the emergency department.   Time call ended:  5:00  I provided 12 minutes of  non face-to-face time during this encounter.    Mary-Margaret Hassell Done, FNP

## 2021-01-15 ENCOUNTER — Inpatient Hospital Stay: Admission: RE | Admit: 2021-01-15 | Payer: Medicare Other | Source: Ambulatory Visit

## 2021-02-03 ENCOUNTER — Ambulatory Visit (INDEPENDENT_AMBULATORY_CARE_PROVIDER_SITE_OTHER): Payer: Medicare Other

## 2021-02-03 VITALS — Ht 63.0 in | Wt 185.0 lb

## 2021-02-03 DIAGNOSIS — Z87891 Personal history of nicotine dependence: Secondary | ICD-10-CM

## 2021-02-03 DIAGNOSIS — Z1231 Encounter for screening mammogram for malignant neoplasm of breast: Secondary | ICD-10-CM

## 2021-02-03 DIAGNOSIS — Z0001 Encounter for general adult medical examination with abnormal findings: Secondary | ICD-10-CM | POA: Diagnosis not present

## 2021-02-03 DIAGNOSIS — Z0000 Encounter for general adult medical examination without abnormal findings: Secondary | ICD-10-CM

## 2021-02-03 NOTE — Patient Instructions (Signed)
[name] , Thank you for taking time to come for your Medicare Wellness Visit. I appreciate your ongoing commitment to your health goals. Please review the following plan we discussed and let me know if I can assist you in the future.   Screening recommendations/referrals: Colonoscopy: Done 08/22/2013 Repeat in 10 years  Mammogram: Order placed today. Bone Density: Due at age 61.  Recommended yearly ophthalmology/optometry visit for glaucoma screening and checkup Recommended yearly dental visit for hygiene and checkup  Vaccinations: Influenza vaccine: Due. Repeat annually  Pneumococcal vaccine: Due at age 70. Tdap vaccine: Done 08/02/2017 Repeat in 10 years  Shingles vaccine: Due. Covid-19: Declined.   Advanced directives: Advance directive discussed with you today. Even though you declined this today, please call our office should you change your mind, and we can give you the proper paperwork for you to fill out.   Conditions/risks identified: Aim for 30 minutes of exercise or brisk walking each day, drink 6-8 glasses of water and eat lots of fruits and vegetables.   Next appointment: Follow up in one year for your annual wellness visit. 2024  Preventive Care 40-64 Years, Female Preventive care refers to lifestyle choices and visits with your health care provider that can promote health and wellness. What does preventive care include? A yearly physical exam. This is also called an annual well check. Dental exams once or twice a year. Routine eye exams. Ask your health care provider how often you should have your eyes checked. Personal lifestyle choices, including: Daily care of your teeth and gums. Regular physical activity. Eating a healthy diet. Avoiding tobacco and drug use. Limiting alcohol use. Practicing safe sex. Taking low-dose aspirin daily starting at age 76. Taking vitamin and mineral supplements as recommended by your health care provider. What happens during  an annual well check? The services and screenings done by your health care provider during your annual well check will depend on your age, overall health, lifestyle risk factors, and family history of disease. Counseling  Your health care provider may ask you questions about your: Alcohol use. Tobacco use. Drug use. Emotional well-being. Home and relationship well-being. Sexual activity. Eating habits. Work and work Statistician. Method of birth control. Menstrual cycle. Pregnancy history. Screening  You may have the following tests or measurements: Height, weight, and BMI. Blood pressure. Lipid and cholesterol levels. These may be checked every 5 years, or more frequently if you are over 19 years old. Skin check. Lung cancer screening. You may have this screening every year starting at age 32 if you have a 30-pack-year history of smoking and currently smoke or have quit within the past 15 years. Fecal occult blood test (FOBT) of the stool. You may have this test every year starting at age 57. Flexible sigmoidoscopy or colonoscopy. You may have a sigmoidoscopy every 5 years or a colonoscopy every 10 years starting at age 77. Hepatitis C blood test. Hepatitis B blood test. Sexually transmitted disease (STD) testing. Diabetes screening. This is done by checking your blood sugar (glucose) after you have not eaten for a while (fasting). You may have this done every 1-3 years. Mammogram. This may be done every 1-2 years. Talk to your health care provider about when you should start having regular mammograms. This may depend on whether you have a family history of breast cancer. BRCA-related cancer screening. This may be done if you have a family history of breast, ovarian, tubal, or peritoneal cancers. Pelvic exam and Pap test. This may be  done every 3 years starting at age 63. Starting at age 71, this may be done every 5 years if you have a Pap test in combination with an HPV test. Bone  density scan. This is done to screen for osteoporosis. You may have this scan if you are at high risk for osteoporosis. Discuss your test results, treatment options, and if necessary, the need for more tests with your health care provider. Vaccines  Your health care provider may recommend certain vaccines, such as: Influenza vaccine. This is recommended every year. Tetanus, diphtheria, and acellular pertussis (Tdap, Td) vaccine. You may need a Td booster every 10 years. Zoster vaccine. You may need this after age 50. Pneumococcal 13-valent conjugate (PCV13) vaccine. You may need this if you have certain conditions and were not previously vaccinated. Pneumococcal polysaccharide (PPSV23) vaccine. You may need one or two doses if you smoke cigarettes or if you have certain conditions. Talk to your health care provider about which screenings and vaccines you need and how often you need them. This information is not intended to replace advice given to you by your health care provider. Make sure you discuss any questions you have with your health care provider. Document Released: 01/18/2015 Document Revised: 09/11/2015 Document Reviewed: 10/23/2014 Elsevier Interactive Patient Education  2017 [place] Prevention in the Home Falls can cause injuries. They can happen to people of all ages. There are many things you can do to make your home safe and to help prevent falls. What can I do on the outside of my home? Regularly fix the edges of walkways and driveways and fix any cracks. Remove anything that might make you trip as you walk through a door, such as a raised step or threshold. Trim any bushes or trees on the path to your home. Use bright outdoor lighting. Clear any walking paths of anything that might make someone trip, such as rocks or tools. Regularly check to see if handrails are loose or broken. Make sure that both sides of any steps have handrails. Any raised decks and  porches should have guardrails on the edges. Have any leaves, snow, or ice cleared regularly. Use sand or salt on walking paths during winter. Clean up any spills in your garage right away. This includes oil or grease spills. What can I do in the bathroom? Use night lights. Install grab bars by the toilet and in the tub and shower. Do not use towel bars as grab bars. Use non-skid mats or decals in the tub or shower. If you need to sit down in the shower, use a plastic, non-slip stool. Keep the floor dry. Clean up any water that spills on the floor as soon as it happens. Remove soap buildup in the tub or shower regularly. Attach bath mats securely with double-sided non-slip rug tape. Do not have throw rugs and other things on the floor that can make you trip. What can I do in the bedroom? Use night lights. Make sure that you have a light by your bed that is easy to reach. Do not use any sheets or blankets that are too big for your bed. They should not hang down onto the floor. Have a firm chair that has side arms. You can use this for support while you get dressed. Do not have throw rugs and other things on the floor that can make you trip. What can I do in the kitchen? Clean up any spills right away. Avoid walking  on wet floors. Keep items that you use a lot in easy-to-reach places. If you need to reach something above you, use a strong step stool that has a grab bar. Keep electrical cords out of the way. Do not use floor polish or wax that makes floors slippery. If you must use wax, use non-skid floor wax. Do not have throw rugs and other things on the floor that can make you trip. What can I do with my stairs? Do not leave any items on the stairs. Make sure that there are handrails on both sides of the stairs and use them. Fix handrails that are broken or loose. Make sure that handrails are as long as the stairways. Check any carpeting to make sure that it is firmly attached to the  stairs. Fix any carpet that is loose or worn. Avoid having throw rugs at the top or bottom of the stairs. If you do have throw rugs, attach them to the floor with carpet tape. Make sure that you have a light switch at the top of the stairs and the bottom of the stairs. If you do not have them, ask someone to add them for you. What else can I do to help prevent falls? Wear shoes that: Do not have high heels. Have rubber bottoms. Are comfortable and fit you well. Are closed at the toe. Do not wear sandals. If you use a stepladder: Make sure that it is fully opened. Do not climb a closed stepladder. Make sure that both sides of the stepladder are locked into place. Ask someone to hold it for you, if possible. Clearly mark and make sure that you can see: Any grab bars or handrails. First and last steps. Where the edge of each step is. Use tools that help you move around (mobility aids) if they are needed. These include: Canes. Walkers. Scooters. Crutches. Turn on the lights when you go into a dark area. Replace any light bulbs as soon as they burn out. Set up your furniture so you have a clear path. Avoid moving your furniture around. If any of your floors are uneven, fix them. If there are any pets around you, be aware of where they are. Review your medicines with your doctor. Some medicines can make you feel dizzy. This can increase your chance of falling. Ask your doctor what other things that you can do to help prevent falls. This information is not intended to replace advice given to you by your health care provider. Make sure you discuss any questions you have with your health care provider. Document Released: 10/18/2008 Document Revised: 05/30/2015 Document Reviewed: 01/26/2014 Elsevier Interactive Patient Education  2017 [employer].

## 2021-02-03 NOTE — Progress Notes (Signed)
Subjective:   Brianna [name] is a 61 y.o. female who presents for an Initial Medicare Annual Wellness Visit. Virtual Visit via Telephone Note  I connected with  Brianna [name] on 02/03/21 at  3:30 PM EST by telephone and verified that I am speaking with the correct person using two identifiers.  Location: Patient: HOME Provider: [facility] Persons participating in the virtual visit: patient/Nurse Health Advisor   I discussed the limitations, risks, security and privacy concerns of performing an evaluation and management service by telephone and the availability of in person appointments. The patient expressed understanding and agreed to proceed.  Interactive audio and video telecommunications were attempted between this nurse and patient, however failed, due to patient having technical difficulties OR patient did not have access to video capability.  We continued and completed visit with audio only.  Some vital signs may be absent or patient reported.   Chriss Driver, LPN  Review of Systems     Cardiac Risk Factors include: hypertension;Other (see comment);obesity (BMI >30kg/m2);sedentary lifestyle, Risk factor comments: anxiety  PHONE VISIT. PT AT HOME. NURSE AT [facility].    Objective:    Today's Vitals   02/03/21 1531  Weight: 185 lb (83.9 kg)  Height: 5\' 3"  (1.6 m)   Body mass index is 32.77 kg/m.  Advanced Directives 02/03/2021 02/12/2020 01/04/2020 12/20/2016 01/15/2016 06/29/2015 04/22/2015  Does Patient Have a Medical Advance Directive? No No No No No No No  Would patient like information on creating a medical advance directive? No - Patient declined - No - Patient declined - No - Patient declined - -    Current Medications (verified) Outpatient Encounter Medications as of 02/03/2021  Medication Sig   aspirin-acetaminophen-caffeine (EXCEDRIN MIGRAINE) 250-250-65 MG tablet Take 2 tablets by mouth every 8 (eight) hours as needed for headache.   atorvastatin (LIPITOR)  20 MG tablet Take 1 tablet (20 mg total) by mouth daily.   citalopram (CELEXA) 40 MG tablet Take 1 tablet (40 mg total) by mouth daily.   doxycycline (VIBRA-TABS) 100 MG tablet Take 1 tablet (100 mg total) by mouth 2 (two) times daily. 1 po bid   famotidine (PEPCID) 40 MG tablet TAKE ONE TABLET BY MOUTH AT BEDTIME   fluticasone (FLONASE) 50 MCG/ACT nasal spray Place 1 spray into both nostrils 2 (two) times daily as needed for allergies or rhinitis.   furosemide (LASIX) 20 MG tablet Take 1 tablet (20 mg total) by mouth daily.   lisinopril (ZESTRIL) 10 MG tablet Take 1 tablet (10 mg total) by mouth daily.   loratadine (CLARITIN) 10 MG tablet Take 1 tablet (10 mg total) by mouth daily.   pantoprazole (PROTONIX) 40 MG tablet TAKE ONE TABLET BY MOUTH TWICE DAILY   pregabalin (LYRICA) 100 MG capsule Take 1 capsule (100 mg total) by mouth 2 (two) times daily.   [DISCONTINUED] pregabalin (LYRICA) 100 MG capsule Take by mouth.   No facility-administered encounter medications on file as of 02/03/2021.    Allergies (verified) Tylenol with codeine #3 [acetaminophen-codeine]   History: Past Medical History:  Diagnosis Date   Anemia    Anxiety    Arthritis    Chronic back pain    Chronic neck pain    Chronic right hip pain since 09/2012   DDD (degenerative disc disease), cervical    Depression    Diverticulitis    Fibromyalgia    GERD (gastroesophageal reflux disease)    Headache    Hypertension    Pancreatitis  Past Surgical History:  Procedure Laterality Date   CESAREAN SECTION     CHOLECYSTECTOMY N/A 08/16/2014   Procedure: LAPAROSCOPIC CHOLECYSTECTOMY WITH CHOLANGIOGRAM;  Surgeon: Sherri Rad, MD;  Location: [facility] ORS;  Service: General;  Laterality: N/A;   COLONOSCOPY N/A 08/22/2013   KGY:JEHUDJS diverticulosis paper hemtochezia from anorectal irritation   ESOPHAGOGASTRODUODENOSCOPY N/A 08/22/2013   HFW:YOVZCHY reflux esophagitis/noncritical schatzki's ring and hiatial hernia    ESOPHAGOGASTRODUODENOSCOPY (EGD) WITH PROPOFOL N/A 01/04/2020     Surgeon: Daneil Dolin, MD; Erosive reflux esophagitis s/p dilation, medium-sized hiatal hernia, normal examined duodenum.   MALONEY DILATION N/A 01/04/2020   Procedure: Venia Minks DILATION;  Surgeon: Daneil Dolin, MD;  Location: [facility];  Service: Endoscopy;  Laterality: N/A;   OOPHORECTOMY     Family History  Problem Relation Age of Onset   Ovarian cancer Mother    Gallbladder disease Son    Colon cancer Neg Hx    Social History   Socioeconomic History   Marital status: Widowed    Spouse name: Not on file   Number of children: 4   Years of education: Not on file   Highest education level: Not on file  Occupational History    Comment: hasn't worked since October 2014. Filing for disability  Tobacco Use   Smoking status: Former    Packs/day: 0.50    Years: 2.00    Pack years: 1.00    Types: Cigarettes    Quit date: 01/06/2007    Years since quitting: 14.0   Smokeless tobacco: Never  Vaping Use   Vaping Use: Never used  Substance and Sexual Activity   Alcohol use: No   Drug use: No   Sexual activity: Not Currently  Other Topics Concern   Not on file  Social History Narrative   10 grandchildren.    Social Determinants of Health   Financial Resource Strain: Low Risk    Difficulty of Paying Living Expenses: Not hard at all  Food Insecurity: No Food Insecurity   Worried About Charity fundraiser in the Last Year: Never true   [place] in the Last Year: Never true  Transportation Needs: No Transportation Needs   Lack of Transportation (Medical): No   Lack of Transportation (Non-Medical): No  Physical Activity: Insufficiently Active   Days of Exercise per Week: 7 days   Minutes of Exercise per Session: 20 min  Stress: No Stress Concern Present   Feeling of Stress : Not at all  Social Connections: Moderately Integrated   Frequency of Communication with Friends and Family: More than three  times a week   Frequency of Social Gatherings with Friends and Family: More than three times a week   Attends Religious Services: More than 4 times per year   Active Member of [employer] or Organizations: Yes   Attends Archivist Meetings: More than 4 times per year   Marital Status: Widowed    Tobacco Counseling Counseling given: Not Answered   Clinical Intake:  Pre-visit preparation completed: Yes  Pain : No/denies pain     BMI - recorded: 32.77 Nutritional Status: BMI > 30  Obese Nutritional Risks: None Diabetes: No  How often do you need to have someone help you when you read instructions, pamphlets, or other written materials from your doctor or pharmacy?: 1 - Never  Diabetic?NO  Interpreter Needed?: No  Information entered by :: mj Derian Pfost, lpn   Activities of Daily Living In your present state of health,  do you have any difficulty performing the following activities: 02/03/2021  Hearing? N  Vision? N  Difficulty concentrating or making decisions? Y  Comment Memory at time.  Walking or climbing stairs? N  Dressing or bathing? N  Doing errands, shopping? N  Preparing Food and eating ? N  Using the Toilet? N  In the past six months, have you accidently leaked urine? Y  Do you have problems with loss of bowel control? Y  Managing your Medications? N  Managing your Finances? N  Housekeeping or managing your Housekeeping? N  Some recent data might be hidden    Patient Care Team: Dettinger, Fransisca Kaufmann, MD as PCP - General (Family Medicine) Gala Romney, Cristopher Estimable, MD as Consulting Physician (Gastroenterology)  Indicate any recent Medical Services you may have received from other than [facility] providers in the past year (date may be approximate).     Assessment:   This is a routine wellness examination for Damiyah.  Hearing/Vision screen Hearing Screening - Comments:: No hearing issues. Vision Screening - Comments:: Glasses. Dr. Radford Pax. 2022.  Dietary issues  and exercise activities discussed: Current Exercise Habits: Home exercise routine, Type of exercise: walking, Time (Minutes): 20, Frequency (Times/Week): 7, Weekly Exercise (Minutes/Week): 140, Intensity: Mild, Exercise limited by: cardiac condition(s)   Goals Addressed             This Visit's Progress    Exercise 3x per week (30 min per time)       Continue to exercise and try to lose weight       Depression Screen PHQ 2/9 Scores 02/03/2021 10/16/2020 10/16/2020 09/17/2020 04/26/2020 01/19/2020 10/23/2019  PHQ - 2 Score 0 2 0 0 0 1 0  PHQ- 9 Score - 7 5 5  - - -  Exception Documentation - - - - - - -    Fall Risk Fall Risk  02/03/2021 10/16/2020 04/26/2020 01/19/2020 10/23/2019  Falls in the past year? 1 0 0 0 0  Number falls in past yr: 0 - - - -  Injury with Fall? 0 - - - -  Risk for fall due to : History of fall(s) - - - -  Follow up Falls prevention discussed - - - -    FALL RISK PREVENTION PERTAINING TO THE HOME:  Any stairs in or around the home? Yes  If so, are there any without handrails? No  Home free of loose throw rugs in walkways, pet beds, electrical cords, etc? Yes  Adequate lighting in your home to reduce risk of falls? Yes   ASSISTIVE DEVICES UTILIZED TO PREVENT FALLS:  Life alert? No  Use of a cane, walker or w/c? No  Grab bars in the bathroom? No  Shower chair or bench in shower? No  Elevated toilet seat or a handicapped toilet? No   TIMED UP AND GO:  Was the test performed? No .  Phone visit.  Cognitive Function:     6CIT Screen 02/03/2021  What Year? 0 points  What month? 0 points  What time? 0 points  Count back from 20 0 points  Months in reverse 0 points  Repeat phrase 0 points  Total Score 0    Immunizations Immunization History  Administered Date(s) Administered   Tdap 08/02/2017    TDAP status: Up to date  Flu Vaccine status: Declined, Education has been provided regarding the importance of this vaccine but patient still  declined. Advised may receive this vaccine at local pharmacy or Health Dept. Aware to provide  a copy of the vaccination record if obtained from local pharmacy or Health Dept. Verbalized acceptance and understanding.  Pneumococcal vaccine status: Due, Education has been provided regarding the importance of this vaccine. Advised may receive this vaccine at local pharmacy or Health Dept. Aware to provide a copy of the vaccination record if obtained from local pharmacy or Health Dept. Verbalized acceptance and understanding.  Covid-19 vaccine status: Declined, Education has been provided regarding the importance of this vaccine but patient still declined. Advised may receive this vaccine at local pharmacy or Health Dept.or vaccine clinic. Aware to provide a copy of the vaccination record if obtained from local pharmacy or Health Dept. Verbalized acceptance and understanding.  Qualifies for Shingles Vaccine? Yes   Zostavax completed No   Shingrix Completed?: No.    Education has been provided regarding the importance of this vaccine. Patient has been advised to call insurance company to determine out of pocket expense if they have not yet received this vaccine. Advised may also receive vaccine at local pharmacy or Health Dept. Verbalized acceptance and understanding.  Screening Tests Health Maintenance  Topic Date Due   COVID-19 Vaccine (1) Never done   Zoster Vaccines- Shingrix (1 of 2) Never done   MAMMOGRAM  10/13/2019   INFLUENZA VACCINE  04/04/2021 (Originally 08/05/2020)   PAP SMEAR-Modifier  01/19/2023   COLONOSCOPY (Pts 45-17yrs Insurance coverage will need to be confirmed)  08/23/2023   TETANUS/TDAP  08/03/2027   Hepatitis C Screening  Completed   HIV Screening  Completed   HPV VACCINES  Aged Out    Health Maintenance  Health Maintenance Due  Topic Date Due   COVID-19 Vaccine (1) Never done   Zoster Vaccines- Shingrix (1 of 2) Never done   MAMMOGRAM  10/13/2019    Colorectal  cancer screening: Type of screening: Colonoscopy. Completed 08/22/2013. Repeat every 10 years  Mammogram status: Ordered 02/03/21. Pt provided with contact info and advised to call to schedule appt.   Bone Density status: Ordered DUE AT AGE 51.. Pt provided with contact info and advised to call to schedule appt.  Lung Cancer Screening: (Low Dose CT Chest recommended if Age 99-80 years, 30 pack-year currently smoking OR have quit w/in 15years.) does qualify.   Lung Cancer Screening Referral: Order placed.  Additional Screening:  Hepatitis C Screening: does qualify; Completed 08/05/2014  Vision Screening: Recommended annual ophthalmology exams for early detection of glaucoma and other disorders of the eye. Is the patient up to date with their annual eye exam?  Yes  Who is the provider or what is the name of the office in which the patient attends annual eye exams? Dr. Sabra Heck in [place] If pt is not established with a provider, would they like to be referred to a provider to establish care? No .   Dental Screening: Recommended annual dental exams for proper oral hygiene  Community Resource Referral / Chronic Care Management: CRR required this visit?  No   CCM required this visit?  No      Plan:     I have personally reviewed and noted the following in the patients chart:   Medical and social history Use of alcohol, tobacco or illicit drugs  Current medications and supplements including opioid prescriptions. Patient is not currently taking opioid prescriptions. Functional ability and status Nutritional status Physical activity Advanced directives List of other physicians Hospitalizations, surgeries, and ER visits in previous 12 months Vitals Screenings to include cognitive, depression, and falls Referrals and appointments  In  addition, I have reviewed and discussed with patient certain preventive protocols, quality metrics, and best practice recommendations. A written  personalized care plan for preventive services as well as general preventive health recommendations were provided to patient.     Chriss Driver, LPN   08/20/8385   Nurse Notes: Pt missed last mammogram appointment. Pt would like order placed to repeat. Order placed today. Discussed lung cancer screening, pt would like to proceed. Order placed today.

## 2021-02-18 ENCOUNTER — Other Ambulatory Visit: Payer: Self-pay | Admitting: Family Medicine

## 2021-02-18 DIAGNOSIS — F419 Anxiety disorder, unspecified: Secondary | ICD-10-CM

## 2021-02-18 DIAGNOSIS — F32A Depression, unspecified: Secondary | ICD-10-CM

## 2021-02-19 ENCOUNTER — Encounter: Payer: Self-pay | Admitting: *Deleted

## 2021-03-03 ENCOUNTER — Encounter: Payer: Self-pay | Admitting: Family Medicine

## 2021-03-03 ENCOUNTER — Ambulatory Visit (INDEPENDENT_AMBULATORY_CARE_PROVIDER_SITE_OTHER): Payer: Medicare Other | Admitting: Family Medicine

## 2021-03-03 VITALS — BP 111/75 | HR 70 | Ht 63.0 in | Wt 180.0 lb

## 2021-03-03 DIAGNOSIS — R399 Unspecified symptoms and signs involving the genitourinary system: Secondary | ICD-10-CM | POA: Diagnosis not present

## 2021-03-03 LAB — URINALYSIS
Bilirubin, UA: NEGATIVE
Glucose, UA: NEGATIVE
Nitrite, UA: NEGATIVE
Protein,UA: NEGATIVE
Specific Gravity, UA: 1.025 (ref 1.005–1.030)
Urobilinogen, Ur: 0.2 mg/dL (ref 0.2–1.0)
pH, UA: 5 (ref 5.0–7.5)

## 2021-03-03 MED ORDER — DOXYCYCLINE HYCLATE 100 MG PO TABS: 100.0000 mg | ORAL_TABLET | Freq: Two times a day (BID) | ORAL | 0 refills | Status: DC

## 2021-03-03 NOTE — Progress Notes (Signed)
BP 111/75    Pulse 70    Ht 5\' 3"  (1.6 m)    Wt 180 lb (81.6 kg)    LMP 04/14/2012    SpO2 99%    BMI 31.89 kg/m    Subjective:   Patient ID: Brianna [name], female    DOB: [birth year], 61 y.o.   MRN: [id number]  HPI: Brianna [name] is a 61 y.o. female presenting on 03/03/2021 for Urinary Tract Infection   HPI UTI Patient is coming in with complaints of urgency and urinary frequency.  She says that this is been going on for 2 to 3 months has been worsening where she will get pelvic pain and pressure and then feel like she has to go and have some amount of urinary incontinence on herself at times or she has to make it to the bathroom very quickly to get there.  She says this has been increasing over the past few months.  She denies any abdominal pain or flank pain or fevers or chills.  She denies any vaginal discharge or irritation.  She does not know why all of a sudden come up and she has not been sexually active in over 6 years.  Relevant past medical, surgical, family and social history reviewed and updated as indicated. Interim medical history since our last visit reviewed. Allergies and medications reviewed and updated.  Review of Systems  Constitutional:  Negative for chills and fever.  Eyes:  Negative for visual disturbance.  Respiratory:  Negative for chest tightness and shortness of breath.   Cardiovascular:  Negative for chest pain and leg swelling.  Gastrointestinal:  Negative for abdominal pain.  Genitourinary:  Positive for frequency, pelvic pain and urgency. Negative for difficulty urinating, dysuria, hematuria, vaginal bleeding, vaginal discharge and vaginal pain.  Musculoskeletal:  Negative for back pain and gait problem.  Skin:  Negative for rash.  Neurological:  Negative for light-headedness and headaches.  Psychiatric/Behavioral:  Negative for agitation and behavioral problems.   All other systems reviewed and are negative.  Per HPI unless specifically  indicated above   Allergies as of 03/03/2021       Reactions   Tylenol With Codeine #3 [acetaminophen-codeine] Other (See Comments)   headache        Medication List        Accurate as of March 03, 2021  4:22 PM. If you have any questions, ask your nurse or doctor.          aspirin-acetaminophen-caffeine 250-250-65 MG tablet Commonly known as: EXCEDRIN MIGRAINE Take 2 tablets by mouth every 8 (eight) hours as needed for headache.   atorvastatin 20 MG tablet Commonly known as: LIPITOR Take 1 tablet (20 mg total) by mouth daily.   citalopram 40 MG tablet Commonly known as: CELEXA TAKE ONE TABLET BY MOUTH DAILY   doxycycline 100 MG tablet Commonly known as: VIBRA-TABS Take 1 tablet (100 mg total) by mouth 2 (two) times daily. 1 po bid   famotidine 40 MG tablet Commonly known as: PEPCID TAKE ONE TABLET BY MOUTH AT BEDTIME   fluticasone 50 MCG/ACT nasal spray Commonly known as: FLONASE Place 1 spray into both nostrils 2 (two) times daily as needed for allergies or rhinitis.   furosemide 20 MG tablet Commonly known as: LASIX Take 1 tablet (20 mg total) by mouth daily.   lisinopril 10 MG tablet Commonly known as: ZESTRIL Take 1 tablet (10 mg total) by mouth daily.   loratadine 10 MG tablet Commonly known  as: CLARITIN Take 1 tablet (10 mg total) by mouth daily.   pantoprazole 40 MG tablet Commonly known as: PROTONIX TAKE ONE TABLET BY MOUTH TWICE DAILY   pregabalin 100 MG capsule Commonly known as: LYRICA Take 1 capsule (100 mg total) by mouth 2 (two) times daily.         Objective:   BP 111/75    Pulse 70    Ht 5\' 3"  (1.6 m)    Wt 180 lb (81.6 kg)    LMP 04/14/2012    SpO2 99%    BMI 31.89 kg/m   Wt Readings from Last 3 Encounters:  03/03/21 180 lb (81.6 kg)  02/03/21 185 lb (83.9 kg)  11/18/20 185 lb (83.9 kg)    Physical Exam Vitals and nursing note reviewed. Exam conducted with a chaperone present.  Constitutional:      General: She is  not in acute distress.    Appearance: She is well-developed. She is not diaphoretic.  Eyes:     Conjunctiva/sclera: Conjunctivae normal.  Abdominal:     General: Abdomen is flat. Bowel sounds are normal. There is no distension.     Palpations: Abdomen is soft. Abdomen is not rigid. There is no mass.     Tenderness: There is abdominal tenderness in the suprapubic area. There is no guarding or rebound.  Genitourinary:    Exam position: Lithotomy position.     Labia:        Right: No rash.        Left: No rash or tenderness.      Urethra: Prolapse (Small amount of prolapse) present. No urethral pain, urethral swelling or urethral lesion.     Vagina: No signs of injury. No vaginal discharge, tenderness or bleeding.     Cervix: No discharge.     Comments: Small amount of vaginal atrophy Musculoskeletal:        General: No tenderness. Normal range of motion.  Skin:    General: Skin is warm and dry.     Findings: No rash.  Neurological:     Mental Status: She is alert and oriented to person, place, and time.     Coordination: Coordination normal.  Psychiatric:        Behavior: Behavior normal.    Urinalysis: Trace ketones and trace leukocytes and trace blood  Assessment & Plan:   Problem List Items Addressed This Visit   None Visit Diagnoses     UTI symptoms    -  Primary   Relevant Medications   doxycycline (VIBRA-TABS) 100 MG tablet   Other Relevant Orders   Urine Culture   Urinalysis       Give doxycycline for urinalysis  Follow up plan: Return if symptoms worsen or fail to improve.  Counseling provided for all of the vaccine components Orders Placed This Encounter  Procedures   Urine Culture   Urinalysis    Caryl Pina, MD [facility] 03/03/2021, 4:22 PM

## 2021-03-05 LAB — URINE CULTURE

## 2021-03-19 ENCOUNTER — Ambulatory Visit (INDEPENDENT_AMBULATORY_CARE_PROVIDER_SITE_OTHER): Payer: Medicare Other | Admitting: Family Medicine

## 2021-03-19 ENCOUNTER — Encounter: Payer: Self-pay | Admitting: Family Medicine

## 2021-03-19 VITALS — BP 106/76 | HR 76 | Ht 63.0 in | Wt 185.0 lb

## 2021-03-19 DIAGNOSIS — M170 Bilateral primary osteoarthritis of knee: Secondary | ICD-10-CM | POA: Diagnosis not present

## 2021-03-19 DIAGNOSIS — M1611 Unilateral primary osteoarthritis, right hip: Secondary | ICD-10-CM | POA: Diagnosis not present

## 2021-03-19 MED ORDER — METHYLPREDNISOLONE ACETATE 40 MG/ML IJ SUSP
80.0000 mg | Freq: Once | INTRAMUSCULAR | Status: AC
  Administered 2021-03-19: 80 mg via INTRAMUSCULAR

## 2021-03-19 NOTE — Progress Notes (Signed)
? ?BP 106/76   Pulse 76   Ht '5\' 3"'$  (1.6 m)   Wt 185 lb (83.9 kg)   LMP 04/14/2012   SpO2 98%   BMI 32.77 kg/m?   ? ?Subjective:  ? ?Patient ID: Brianna [name], female    DOB: 09-18-1960, 61 y.o.   MRN: [id number] ? ?HPI: ?Brianna [name] is a 61 y.o. female presenting on 03/19/2021 for Knee Pain (bilateral) and Hip Pain (Right ) ? ? ?HPI ?Patient comes in complaining of knee pain bilateral and hip pain on the right, is a things that she fights off and on.  The left knee pain is the worst right now.  She says she gets some popping and grinding and is worse when she gets up, its very stiff when she starts to get up and move.  She says she has been trying to walk and exercise but has been very painful and it is limiting her.  We did discuss getting in the pool and walking in the pool or swimming in the pool would be a great option for her ? ?Relevant past medical, surgical, family and social history reviewed and updated as indicated. Interim medical history since our last visit reviewed. ?Allergies and medications reviewed and updated. ? ?Review of Systems  ?Constitutional:  Negative for chills and fever.  ?Eyes:  Negative for visual disturbance.  ?Respiratory:  Negative for chest tightness and shortness of breath.   ?Cardiovascular:  Negative for chest pain and leg swelling.  ?Musculoskeletal:  Positive for arthralgias and gait problem.  ?Skin:  Negative for rash.  ?Neurological:  Negative for dizziness, light-headedness and headaches.  ?Psychiatric/Behavioral:  Negative for agitation and behavioral problems.   ?All other systems reviewed and are negative. ? ?Per HPI unless specifically indicated above ? ? ?Allergies as of 03/19/2021   ? ?   Reactions  ? Tylenol With Codeine #3 [acetaminophen-codeine] Other (See Comments)  ? headache  ? ?  ? ?  ?Medication List  ?  ? ?  ? Accurate as of March 19, 2021 10:23 AM. If you have any questions, ask your nurse or doctor.  ?  ?  ? ?  ? ?STOP taking these medications    ? ?doxycycline 100 MG tablet ?Commonly known as: VIBRA-TABS ?Stopped by: Worthy Rancher, MD ?  ? ?  ? ?TAKE these medications   ? ?aspirin-acetaminophen-caffeine 250-250-65 MG tablet ?Commonly known as: [place] ?Take 2 tablets by mouth every 8 (eight) hours as needed for headache. ?  ?atorvastatin 20 MG tablet ?Commonly known as: LIPITOR ?Take 1 tablet (20 mg total) by mouth daily. ?  ?citalopram 40 MG tablet ?Commonly known as: CELEXA ?TAKE ONE TABLET BY MOUTH DAILY ?  ?famotidine 40 MG tablet ?Commonly known as: PEPCID ?TAKE ONE TABLET BY MOUTH AT BEDTIME ?  ?fluticasone 50 MCG/ACT nasal spray ?Commonly known as: FLONASE ?Place 1 spray into both nostrils 2 (two) times daily as needed for allergies or rhinitis. ?  ?furosemide 20 MG tablet ?Commonly known as: LASIX ?Take 1 tablet (20 mg total) by mouth daily. ?  ?lisinopril 10 MG tablet ?Commonly known as: ZESTRIL ?Take 1 tablet (10 mg total) by mouth daily. ?  ?loratadine 10 MG tablet ?Commonly known as: CLARITIN ?Take 1 tablet (10 mg total) by mouth daily. ?  ?pantoprazole 40 MG tablet ?Commonly known as: PROTONIX ?TAKE ONE TABLET BY MOUTH TWICE DAILY ?  ?pregabalin 100 MG capsule ?Commonly known as: LYRICA ?Take 1 capsule (100 mg total)  by mouth 2 (two) times daily. ?  ? ?  ? ? ? ?Objective:  ? ?BP 106/76   Pulse 76   Ht '5\' 3"'$  (1.6 m)   Wt 185 lb (83.9 kg)   LMP 04/14/2012   SpO2 98%   BMI 32.77 kg/m?   ?Wt Readings from Last 3 Encounters:  ?03/19/21 185 lb (83.9 kg)  ?03/03/21 180 lb (81.6 kg)  ?02/03/21 185 lb (83.9 kg)  ?  ?Physical Exam ?Vitals and nursing note reviewed.  ?Constitutional:   ?   General: She is not in acute distress. ?   Appearance: She is well-developed. She is not diaphoretic.  ?Eyes:  ?   Conjunctiva/sclera: Conjunctivae normal.  ?Musculoskeletal:     ?   General: No tenderness. Normal range of motion.  ?   Right hip: Crepitus present. No tenderness or bony tenderness.  ?   Right knee: Crepitus present. No bony  tenderness. Normal range of motion. No tenderness.  ?   Left knee: Crepitus present. No bony tenderness. Normal range of motion. No tenderness.  ?Skin: ?   General: Skin is warm and dry.  ?   Findings: No rash.  ?Neurological:  ?   Mental Status: She is alert and oriented to person, place, and time.  ?   Coordination: Coordination normal.  ?Psychiatric:     ?   Behavior: Behavior normal.  ? ? ? ? ?Assessment & Plan:  ? ?Problem List Items Addressed This Visit   ?None ?Visit Diagnoses   ? ? Primary osteoarthritis of both knees    -  Primary  ? Relevant Medications  ? methylPREDNISolone acetate (DEPO-MEDROL) injection 80 mg (Start on 03/19/2021 10:30 AM)  ? Primary osteoarthritis of right hip      ? Relevant Medications  ? methylPREDNISolone acetate (DEPO-MEDROL) injection 80 mg (Start on 03/19/2021 10:30 AM)  ? ?  ?We will do steroid injection and see if that helps her.  If not improved she can return here or we can do an orthopedic referral In the future. ? ?Follow up plan: ?Return if symptoms worsen or fail to improve. ? ?Counseling provided for all of the vaccine components ?No orders of the defined types were placed in this encounter. ? ? ?Caryl Pina, MD ?[place] ?03/19/2021, 10:23 AM ? ? ?  ?

## 2021-04-10 ENCOUNTER — Other Ambulatory Visit: Payer: Self-pay | Admitting: Gastroenterology

## 2021-04-10 ENCOUNTER — Other Ambulatory Visit: Payer: Self-pay | Admitting: Family Medicine

## 2021-04-10 DIAGNOSIS — F32A Depression, unspecified: Secondary | ICD-10-CM

## 2021-04-10 NOTE — Telephone Encounter (Signed)
Holding off on refilling prescription for now as a 69-monthsupply was sent to her pharmacy in February by PCP. ?

## 2021-04-10 NOTE — Telephone Encounter (Signed)
Last ov 03/04/20 ?

## 2021-04-16 ENCOUNTER — Ambulatory Visit: Payer: Medicare Other | Admitting: Family Medicine

## 2021-04-23 ENCOUNTER — Ambulatory Visit: Payer: Medicaid Other | Admitting: Family Medicine

## 2021-05-01 ENCOUNTER — Other Ambulatory Visit: Payer: Self-pay | Admitting: Family Medicine

## 2021-05-01 NOTE — Telephone Encounter (Signed)
Controlled medication - needs visit  ?

## 2021-05-02 ENCOUNTER — Encounter: Payer: Self-pay | Admitting: Family Medicine

## 2021-05-02 NOTE — Telephone Encounter (Signed)
I called pt & LMTCB for an appt. Also, I sent a letter to remind pt. ?

## 2021-05-08 DIAGNOSIS — Z1231 Encounter for screening mammogram for malignant neoplasm of breast: Secondary | ICD-10-CM | POA: Diagnosis not present

## 2021-05-13 ENCOUNTER — Other Ambulatory Visit: Payer: Self-pay | Admitting: Gastroenterology

## 2021-05-13 NOTE — Telephone Encounter (Signed)
4 month supply sent to pharmacy. Recommend OV for additional refills. Please schedule for Friday virtual on or after June 23 if she is able to complete virtual appointment.  ?

## 2021-05-30 ENCOUNTER — Ambulatory Visit: Payer: Medicaid Other | Admitting: Family Medicine

## 2021-06-11 ENCOUNTER — Other Ambulatory Visit: Payer: Self-pay | Admitting: Family Medicine

## 2021-06-17 ENCOUNTER — Ambulatory Visit (INDEPENDENT_AMBULATORY_CARE_PROVIDER_SITE_OTHER): Payer: Medicare Other | Admitting: Internal Medicine

## 2021-06-17 ENCOUNTER — Encounter: Payer: Self-pay | Admitting: Internal Medicine

## 2021-06-17 VITALS — BP 104/62 | HR 59 | Temp 97.6°F | Ht 63.0 in | Wt 182.0 lb

## 2021-06-17 DIAGNOSIS — R7401 Elevation of levels of liver transaminase levels: Secondary | ICD-10-CM | POA: Diagnosis not present

## 2021-06-17 DIAGNOSIS — R1011 Right upper quadrant pain: Secondary | ICD-10-CM | POA: Diagnosis not present

## 2021-06-17 MED ORDER — HYOSCYAMINE SULFATE ER 0.375 MG PO TB12: 0.3750 mg | ORAL_TABLET | Freq: Two times a day (BID) | ORAL | 0 refills | Status: DC

## 2021-06-17 NOTE — Patient Instructions (Signed)
It was good to see you again today!  You likely have irritable bowel syndrome.  You need to have some blood work before making that diagnosis firm  Begin Levbid 1 tablet twice daily to decrease abdominal discomfort and loose stools (dispense 60  -3 refills)  Information on irritable bowel syndrome provided  Blood work to include C-reactive protein teen, hepatic function profile, serum total IgA (reflex to a TTG IgA if total serum IgA is normal)  Limit caffeine consumption as discussed  Continue Protonix 40 mg twice daily.  May continue famotidine at bedtime as needed  Office visit here in 3 months

## 2021-06-17 NOTE — Progress Notes (Signed)
Primary Care Physician:  Dettinger, Fransisca Kaufmann, MD Primary Gastroenterologist:  Dr. Gala Romney  Pre-Procedure History & Physical: HPI:  Brianna [name] is a 62 y.o. female here for further evaluation of intermittent abdominal cramps associated with loose bowel movements.  No blood per rectum.  Not reproducible.  Can happen with a variety of foods or not at all.  Has no more than 4 bowel movements daily mostly occur in the morning. Reflux symptoms well controlled on twice daily pantoprazole.  Occasional vague dysphagia symptoms.  History of mild reflux esophagitis seen on prior EGD.  No Barrett's.  Dysphagia previously resolved with passage of a large bore [facility] dilator. Sleeps poorly.  Under a great deal of stress.  1 son in prison 1 son is a drug addict.  1 daughter-in-law is a drug addict.  She adopted her son's 96-monthold and is raising him. Last year, labs revealed a slightly elevated ALT of 40 AST T of 27 alkaline phosphatase 146 total bilirubin 0.6 ; Repeat not done.  Hep C antibody negative.  Colonoscopy 2016 for paper hematochezia revealed anorectal irritation and colonic diverticulosis; due for 10-year screening 2025.    Past Medical History:  Diagnosis Date   Anemia    Anxiety    Arthritis    Chronic back pain    Chronic neck pain    Chronic right hip pain since 09/2012   DDD (degenerative disc disease), cervical    Depression    Diverticulitis    Fibromyalgia    GERD (gastroesophageal reflux disease)    Headache    Hypertension    Pancreatitis     Past Surgical History:  Procedure Laterality Date   CESAREAN SECTION     CHOLECYSTECTOMY N/A 08/16/2014   Procedure: LAPAROSCOPIC CHOLECYSTECTOMY WITH CHOLANGIOGRAM;  Surgeon: MSherri Rad MD;  Location: [facility] ORS;  Service: General;  Laterality: N/A;   COLONOSCOPY N/A 08/22/2013   RKNL:ZJQBHALdiverticulosis paper hemtochezia from anorectal irritation   ESOPHAGOGASTRODUODENOSCOPY N/A 08/22/2013   RPFX:TKWIOXBreflux  esophagitis/noncritical schatzki's ring and hiatial hernia   ESOPHAGOGASTRODUODENOSCOPY (EGD) WITH PROPOFOL N/A 01/04/2020     Surgeon: RDaneil Dolin MD; Erosive reflux esophagitis s/p dilation, medium-sized hiatal hernia, normal examined duodenum.   MALONEY DILATION N/A 01/04/2020   Procedure: MVenia MinksDILATION;  Surgeon: RDaneil Dolin MD;  Location: [facility];  Service: Endoscopy;  Laterality: N/A;   OOPHORECTOMY      Prior to Admission medications   Medication Sig Start Date End Date Taking? Authorizing Provider  aspirin-acetaminophen-caffeine (EXCEDRIN MIGRAINE) [phone] tablet Take 2 tablets by mouth every 8 (eight) hours as needed for headache.   Yes [provider]  atorvastatin (LIPITOR) 20 MG tablet Take 1 tablet (20 mg total) by mouth daily. 10/16/20  Yes Dettinger, JFransisca Kaufmann MD  citalopram (CELEXA) 40 MG tablet TAKE ONE TABLET BY MOUTH DAILY 04/10/21  Yes Dettinger, JFransisca Kaufmann MD  famotidine (PEPCID) 40 MG tablet TAKE ONE TABLET BY MOUTH AT BEDTIME 05/13/21  Yes Harper, Kristen S, PA-C  fluticasone (FLONASE) 50 MCG/ACT nasal spray Place 1 spray into both nostrils 2 (two) times daily as needed for allergies or rhinitis. 07/01/16  Yes Dettinger, JFransisca Kaufmann MD  furosemide (LASIX) 20 MG tablet Take 1 tablet (20 mg total) by mouth daily. 10/16/20  Yes Dettinger, JFransisca Kaufmann MD  lisinopril (ZESTRIL) 10 MG tablet Take 1 tablet (10 mg total) by mouth daily. 10/16/20  Yes Dettinger, JFransisca Kaufmann MD  loratadine (CLARITIN) 10 MG tablet Take 1 tablet (  10 mg total) by mouth daily. 10/16/20  Yes Dettinger, Fransisca Kaufmann, MD  pantoprazole (PROTONIX) 40 MG tablet TAKE ONE TABLET BY MOUTH TWICE DAILY 06/11/21  Yes Dettinger, Fransisca Kaufmann, MD    Allergies as of 06/17/2021 - Review Complete 06/17/2021  Allergen Reaction Noted   Tylenol with codeine #3 [acetaminophen-codeine] Other (See Comments) 05/24/2014    Family History  Problem Relation Age of Onset   Ovarian cancer Mother    Gallbladder  disease Son    Colon cancer Neg Hx     Social History   Socioeconomic History   Marital status: Widowed    Spouse name: Not on file   Number of children: 4   Years of education: Not on file   Highest education level: Not on file  Occupational History    Comment: hasn't worked since October 2014. Filing for disability  Tobacco Use   Smoking status: Former    Packs/day: 0.50    Years: 2.00    Total pack years: 1.00    Types: Cigarettes    Quit date: 01/06/2007    Years since quitting: 14.4   Smokeless tobacco: Never  Vaping Use   Vaping Use: Never used  Substance and Sexual Activity   Alcohol use: No   Drug use: No   Sexual activity: Not Currently  Other Topics Concern   Not on file  Social History Narrative   10 grandchildren.    Social Determinants of Health   Financial Resource Strain: Low Risk  (02/03/2021)   Overall Financial Resource Strain (CARDIA)    Difficulty of Paying Living Expenses: Not hard at all  Food Insecurity: No Food Insecurity (02/03/2021)   Hunger Vital Sign    Worried About Running Out of Food in the Last Year: Never true    Ran Out of Food in the Last Year: Never true  Transportation Needs: No Transportation Needs (02/03/2021)   PRAPARE - Hydrologist (Medical): No    Lack of Transportation (Non-Medical): No  Physical Activity: Insufficiently Active (02/03/2021)   Exercise Vital Sign    Days of Exercise per Week: 7 days    Minutes of Exercise per Session: 20 min  Stress: No Stress Concern Present (02/03/2021)   [place]    Feeling of Stress : Not at all  Social Connections: Moderately Integrated (02/03/2021)   Social Connection and Isolation Panel [NHANES]    Frequency of Communication with Friends and Family: More than three times a week    Frequency of Social Gatherings with Friends and Family: More than three times a week    Attends Religious  Services: More than 4 times per year    Active Member of [employer] or Organizations: Yes    Attends Archivist Meetings: More than 4 times per year    Marital Status: Widowed  Intimate Partner Violence: Not At Risk (02/03/2021)   Humiliation, Afraid, Rape, and Kick questionnaire    Fear of Current or Ex-Partner: No    Emotionally Abused: No    Physically Abused: No    Sexually Abused: No    Review of Systems: See HPI, otherwise negative ROS  Physical Exam: BP 104/62 (BP Location: Right Arm, Patient Position: Sitting, Cuff Size: Normal)   Pulse (!) 59   Temp 97.6 F (36.4 C) (Temporal)   Ht '5\' 3"'$  (1.6 m)   Wt 182 lb (82.6 kg)   LMP 04/14/2012   SpO2 95%  BMI 32.24 kg/m  General:   Alert,  Well-developed, well-nourished, pleasant and cooperative in NAD Eyes:  Sclera clear, no icterus.   Conjunctiva pink. Neck:  Supple; no masses or thyromegaly. No significant cervical adenopathy. Lungs:  Clear throughout to auscultation.   No wheezes, crackles, or rhonchi. No acute distress. Heart:  Regular rate and rhythm; no murmurs, clicks, rubs,  or gallops. Abdomen: Non-distended, normal bowel sounds.  Soft and nontender without appreciable mass or hepatosplenomegaly.  Pulses:  Normal pulses noted. Extremities:  Without clubbing or edema.  Impression/Plan: 61 year old lady with long history of GERD fairly well controlled on twice daily PPI therapy.  Intermittent abdominal pain and postprandial diarrhea most consistent with irritable bowel syndrome.  Symptoms are intermittent.  Mildly elevated alkaline phosphatase and ALT last year of uncertain significance-repeat assay as indicated  Patient needs a celiac screen and a C-reactive protein level.  I discussed the likelihood of irritable bowel syndrome at length with the patient today.  Patient has major life stressors which may be contributing to her symptoms.  Recommendations:  Begin Levbid 1 tablet twice daily to decrease  abdominal discomfort and loose stools (dispense 60  -3 refills)  Information on irritable bowel syndrome provided  Blood work to include C-reactive protein, hepatic function profile, serum total IgA (reflex to a TTG IgA if total serum IgA is normal)  Limit caffeine consumption as discussed  Continue Protonix 40 mg twice daily.  May continue famotidine at bedtime as needed  Office visit here in 3 months     Notice: This dictation was prepared with Dragon dictation along with smaller phrase technology. Any transcriptional errors that result from this process are unintentional and may not be corrected upon review.

## 2021-06-19 LAB — HEPATIC FUNCTION PANEL
AG Ratio: 1.5 (calc) (ref 1.0–2.5)
ALT: 16 U/L (ref 6–29)
AST: 17 U/L (ref 10–35)
Albumin: 4 g/dL (ref 3.6–5.1)
Alkaline phosphatase (APISO): 87 U/L (ref 37–153)
Bilirubin, Direct: 0.1 mg/dL (ref 0.0–0.2)
Globulin: 2.6 g/dL (calc) (ref 1.9–3.7)
Indirect Bilirubin: 0.5 mg/dL (calc) (ref 0.2–1.2)
Total Bilirubin: 0.6 mg/dL (ref 0.2–1.2)
Total Protein: 6.6 g/dL (ref 6.1–8.1)

## 2021-06-19 LAB — TISSUE TRANSGLUTAMINASE, IGA: (tTG) Ab, IgA: <1 U/mL

## 2021-06-19 LAB — IGA: Immunoglobulin A: 172 mg/dL (ref 47–310)

## 2021-06-19 LAB — C-REACTIVE PROTEIN: CRP: 0.7 mg/L (ref <8.0)

## 2021-06-24 ENCOUNTER — Telehealth: Payer: Self-pay

## 2021-06-24 NOTE — Telephone Encounter (Signed)
Pt's insurance has denied the Levbid, pt is wanting to know if something else can be called in.

## 2021-06-25 ENCOUNTER — Other Ambulatory Visit: Payer: Self-pay

## 2021-06-25 MED ORDER — DICYCLOMINE HCL 10 MG PO CAPS: 10.0000 mg | ORAL_CAPSULE | Freq: Three times a day (TID) | ORAL | 3 refills | Status: DC

## 2021-06-25 NOTE — Telephone Encounter (Signed)
Clarification: Did you mean Bentyl?

## 2021-06-25 NOTE — Telephone Encounter (Signed)
Noted  

## 2021-07-03 ENCOUNTER — Encounter: Payer: Self-pay | Admitting: Family Medicine

## 2021-07-03 ENCOUNTER — Ambulatory Visit (INDEPENDENT_AMBULATORY_CARE_PROVIDER_SITE_OTHER): Payer: Medicare Other | Admitting: Family Medicine

## 2021-07-03 VITALS — BP 112/76 | HR 78 | Temp 98.2°F | Ht 63.0 in | Wt 183.0 lb

## 2021-07-03 DIAGNOSIS — I10 Essential (primary) hypertension: Secondary | ICD-10-CM

## 2021-07-03 DIAGNOSIS — E669 Obesity, unspecified: Secondary | ICD-10-CM

## 2021-07-03 DIAGNOSIS — M797 Fibromyalgia: Secondary | ICD-10-CM | POA: Insufficient documentation

## 2021-07-03 DIAGNOSIS — K2100 Gastro-esophageal reflux disease with esophagitis, without bleeding: Secondary | ICD-10-CM | POA: Diagnosis not present

## 2021-07-03 DIAGNOSIS — F32A Depression, unspecified: Secondary | ICD-10-CM | POA: Diagnosis not present

## 2021-07-03 DIAGNOSIS — F419 Anxiety disorder, unspecified: Secondary | ICD-10-CM

## 2021-07-03 MED ORDER — PREGABALIN 50 MG PO CAPS: 50.0000 mg | ORAL_CAPSULE | Freq: Two times a day (BID) | ORAL | 5 refills | Status: DC

## 2021-07-03 MED ORDER — CITALOPRAM HYDROBROMIDE 40 MG PO TABS: 40.0000 mg | ORAL_TABLET | Freq: Every day | ORAL | 1 refills | Status: DC

## 2021-07-03 MED ORDER — PANTOPRAZOLE SODIUM 40 MG PO TBEC: 40.0000 mg | DELAYED_RELEASE_TABLET | Freq: Two times a day (BID) | ORAL | 1 refills | Status: DC

## 2021-07-03 NOTE — Progress Notes (Signed)
BP 112/76   Pulse 78   Temp 98.2 F (36.8 C)   Ht 5\' 3"  (1.6 m)   Wt 183 lb (83 kg)   LMP 04/14/2012   SpO2 96%   BMI 32.42 kg/m    Subjective:   Patient ID: Brianna [name], female    DOB: 1960/07/05, 61 y.o.   MRN: [id number]  HPI: Brianna [name] is a 61 y.o. female presenting on 07/03/2021 for Medical Management of Chronic Issues, Hypertension, Gastroesophageal Reflux, and Abdominal Pain (RUQ)   HPI Anxiety and fibromyalgia Current rx-Lyrica 50 mg twice daily # meds rx- 60 Effectiveness of current meds-works well, she has been out of it for couple weeks but works well when she has it Adverse reactions form meds-none  Pill count performed-No Last drug screen -02/01/2020 ( high risk q39m, moderate risk q60m, low risk yearly ) Urine drug screen today- Yes Was the N[facility] reviewed-yes  If yes were their any concerning findings? - none  No flowsheet data found.   Controlled substance contract signed on: Today    03/19/2021   10:21 AM 03/03/2021    3:45 PM 02/03/2021    3:36 PM 10/16/2020   11:35 AM 10/16/2020   11:06 AM  Depression screen PHQ 2/9  Decreased Interest 1 1 0 1 0  Down, Depressed, Hopeless 1 1 0 1 0  PHQ - 2 Score 2 2 0 2 0  Altered sleeping 1 1  1 1   Tired, decreased energy 1 1  1 3   Change in appetite 1 1  1 1   Feeling bad or failure about yourself  0 0  0 0  Trouble concentrating 0 0  1 0  Moving slowly or fidgety/restless 0 0  1 0  Suicidal thoughts 0 0  0 0  PHQ-9 Score 5 5  7 5      Hypertension Patient is currently on lisinopril and furosemide, and their blood pressure today is 112/76. Patient denies any lightheadedness or dizziness. Patient denies headaches, blurred vision, chest pains, shortness of breath, or weakness. Denies any side effects from medication and is content with current medication.   GERD Patient is currently on pantoprazole and Pepcid.  She denies any major symptoms or abdominal pain or belching or burping. She denies  any blood in her stool or lightheadedness or dizziness.   Relevant past medical, surgical, family and social history reviewed and updated as indicated. Interim medical history since our last visit reviewed. Allergies and medications reviewed and updated.  Review of Systems  Constitutional:  Negative for chills and fever.  Eyes:  Negative for visual disturbance.  Respiratory:  Negative for chest tightness and shortness of breath.   Cardiovascular:  Negative for chest pain and leg swelling.  Genitourinary:  Negative for difficulty urinating and dysuria.  Musculoskeletal:  Negative for back pain and gait problem.  Skin:  Negative for rash.  Neurological:  Negative for light-headedness and headaches.  Psychiatric/Behavioral:  Negative for agitation and behavioral problems.   All other systems reviewed and are negative.   Per HPI unless specifically indicated above   Allergies as of 07/03/2021       Reactions   Tylenol With Codeine #3 [acetaminophen-codeine] Other (See Comments)   headache        Medication List        Accurate as of July 03, 2021  1:28 PM. If you have any questions, ask your nurse or doctor.  aspirin-acetaminophen-caffeine 250-250-65 MG tablet Commonly known as: EXCEDRIN MIGRAINE Take 2 tablets by mouth every 8 (eight) hours as needed for headache.   atorvastatin 20 MG tablet Commonly known as: LIPITOR Take 1 tablet (20 mg total) by mouth daily.   citalopram 40 MG tablet Commonly known as: CELEXA Take 1 tablet (40 mg total) by mouth daily.   dicyclomine 10 MG capsule Commonly known as: [place]ENT[place] Take 1 capsule (10 mg total) by mouth 4 (four) times daily -  before meals and at bedtime.   famotidine 40 MG tablet Commonly known as: PEPCID TAKE ONE TABLET BY MOUTH AT BEDTIME   fluticasone 50 MCG/ACT nasal spray Commonly known as: FLONASE Place 1 spray into both nostrils 2 (two) times daily as needed for allergies or rhinitis.   furosemide  20 MG tablet Commonly known as: LASIX Take 1 tablet (20 mg total) by mouth daily.   lisinopril 10 MG tablet Commonly known as: ZESTRIL Take 1 tablet (10 mg total) by mouth daily.   loratadine 10 MG tablet Commonly known as: CLARITIN Take 1 tablet (10 mg total) by mouth daily.   pantoprazole 40 MG tablet Commonly known as: PROTONIX Take 1 tablet (40 mg total) by mouth 2 (two) times daily.   pregabalin 50 MG capsule Commonly known as: Lyrica Take 1 capsule (50 mg total) by mouth 2 (two) times daily. Started by: Nils Pyle, MD         Objective:   BP 112/76   Pulse 78   Temp 98.2 F (36.8 C)   Ht 5\' 3"  (1.6 m)   Wt 183 lb (83 kg)   LMP 04/14/2012   SpO2 96%   BMI 32.42 kg/m   Wt Readings from Last 3 Encounters:  07/03/21 183 lb (83 kg)  06/17/21 182 lb (82.6 kg)  03/19/21 185 lb (83.9 kg)    Physical Exam Vitals and nursing note reviewed.  Constitutional:      General: She is not in acute distress.    Appearance: She is well-developed. She is not diaphoretic.  Eyes:     Conjunctiva/sclera: Conjunctivae normal.  Cardiovascular:     Rate and Rhythm: Normal rate and regular rhythm.     Heart sounds: Normal heart sounds. No murmur heard. Pulmonary:     Effort: Pulmonary effort is normal. No respiratory distress.     Breath sounds: Normal breath sounds. No wheezing.  Musculoskeletal:        General: No swelling or tenderness. Normal range of motion.  Skin:    General: Skin is warm and dry.     Findings: No rash.  Neurological:     Mental Status: She is alert and oriented to person, place, and time.     Coordination: Coordination normal.  Psychiatric:        Behavior: Behavior normal.       Assessment & Plan:   Problem List Items Addressed This Visit       Cardiovascular and Mediastinum   Essential hypertension, benign   Relevant Orders   CMP14+EGFR     Digestive   GERD (gastroesophageal reflux disease)   Relevant Medications    pantoprazole (PROTONIX) 40 MG tablet   Other Relevant Orders   CBC with Differential/Platelet   CMP14+EGFR     Other   Anxiety and depression - Primary   Relevant Medications   citalopram (CELEXA) 40 MG tablet   Other Relevant Orders   ToxASSURE Select 13 (MW), Urine   Fibromyalgia   Relevant  Medications   citalopram (CELEXA) 40 MG tablet   pregabalin (LYRICA) 50 MG capsule   Other Relevant Orders   CBC with Differential/Platelet   TSH   ToxASSURE Select 13 (MW), Urine   Obesity (BMI 30.0-34.9)   Relevant Orders   Lipid panel    Continue current medicine, will restart Lyrica, will do drug screen and blood work today. Follow up plan: Return in about 6 months (around 01/02/2022), or if symptoms worsen or fail to improve, for Fibromyalgia hypertension anxiety GERD.  Counseling provided for all of the vaccine components Orders Placed This Encounter  Procedures   CBC with Differential/Platelet   CMP14+EGFR   Lipid panel   TSH   ToxASSURE Select 13 (MW), Urine    Arville Care, MD [place] [facility] 07/03/2021, 1:28 PM

## 2021-07-04 LAB — CMP14+EGFR
ALT: 26 IU/L (ref 0–32)
AST: 27 IU/L (ref 0–40)
Albumin/Globulin Ratio: 1.6 (ref 1.2–2.2)
Albumin: 4.1 g/dL (ref 3.8–4.9)
Alkaline Phosphatase: 108 IU/L (ref 44–121)
BUN/Creatinine Ratio: 16 (ref 12–28)
BUN: 16 mg/dL (ref 8–27)
Bilirubin Total: 0.5 mg/dL (ref 0.0–1.2)
CO2: 24 mmol/L (ref 20–29)
Calcium: 9.6 mg/dL (ref 8.7–10.3)
Chloride: 104 mmol/L (ref 96–106)
Creatinine, Ser: 1.01 mg/dL — ABNORMAL HIGH (ref 0.57–1.00)
Globulin, Total: 2.6 g/dL (ref 1.5–4.5)
Glucose: 82 mg/dL (ref 70–99)
Potassium: 4.4 mmol/L (ref 3.5–5.2)
Sodium: 141 mmol/L (ref 134–144)
Total Protein: 6.7 g/dL (ref 6.0–8.5)
eGFR: 64 mL/min/{1.73_m2} (ref >59)

## 2021-07-04 LAB — CBC WITH DIFFERENTIAL/PLATELET
Basophils Absolute: 0.1 10*3/uL (ref 0.0–0.2)
Basos: 1 %
EOS (ABSOLUTE): 0.2 10*3/uL (ref 0.0–0.4)
Eos: 2 %
Hematocrit: 38.2 % (ref 34.0–46.6)
Hemoglobin: 13.1 g/dL (ref 11.1–15.9)
Immature Grans (Abs): 0 10*3/uL (ref 0.0–0.1)
Immature Granulocytes: 0 %
Lymphocytes Absolute: 3 10*3/uL (ref 0.7–3.1)
Lymphs: 41 %
MCH: 29.1 pg (ref 26.6–33.0)
MCHC: 34.3 g/dL (ref 31.5–35.7)
MCV: 85 fL (ref 79–97)
Monocytes Absolute: 0.8 10*3/uL (ref 0.1–0.9)
Monocytes: 10 %
Neutrophils Absolute: 3.4 10*3/uL (ref 1.4–7.0)
Neutrophils: 46 %
Platelets: 342 10*3/uL (ref 150–450)
RBC: 4.5 x10E6/uL (ref 3.77–5.28)
RDW: 12.7 % (ref 11.7–15.4)
WBC: 7.4 10*3/uL (ref 3.4–10.8)

## 2021-07-04 LAB — LIPID PANEL
Chol/HDL Ratio: 2.3 ratio (ref 0.0–4.4)
Cholesterol, Total: 138 mg/dL (ref 100–199)
HDL: 60 mg/dL (ref >39)
LDL Chol Calc (NIH): 56 mg/dL (ref 0–99)
Triglycerides: 126 mg/dL (ref 0–149)
VLDL Cholesterol Cal: 22 mg/dL (ref 5–40)

## 2021-07-04 LAB — TSH: TSH: 2.06 u[IU]/mL (ref 0.450–4.500)

## 2021-07-07 LAB — TOXASSURE SELECT 13 (MW), URINE

## 2021-08-05 DIAGNOSIS — Q7649 Other congenital malformations of spine, not associated with scoliosis: Secondary | ICD-10-CM | POA: Diagnosis not present

## 2021-08-05 DIAGNOSIS — G8929 Other chronic pain: Secondary | ICD-10-CM | POA: Diagnosis not present

## 2021-08-05 DIAGNOSIS — M545 Low back pain, unspecified: Secondary | ICD-10-CM | POA: Diagnosis not present

## 2021-08-07 ENCOUNTER — Encounter: Payer: Self-pay | Admitting: Family Medicine

## 2021-08-07 ENCOUNTER — Ambulatory Visit (INDEPENDENT_AMBULATORY_CARE_PROVIDER_SITE_OTHER): Payer: Medicare Other | Admitting: Family Medicine

## 2021-08-07 VITALS — BP 116/78 | HR 87 | Temp 98.0°F | Ht 63.0 in | Wt 186.0 lb

## 2021-08-07 DIAGNOSIS — Z6831 Body mass index (BMI) 31.0-31.9, adult: Secondary | ICD-10-CM | POA: Diagnosis not present

## 2021-08-07 DIAGNOSIS — E669 Obesity, unspecified: Secondary | ICD-10-CM | POA: Diagnosis not present

## 2021-08-07 MED ORDER — WEGOVY 0.25 MG/0.5ML ~~LOC~~ SOAJ: 0.2500 mg | SUBCUTANEOUS | 1 refills | Status: DC

## 2021-08-07 NOTE — Progress Notes (Signed)
BP 116/78   Pulse 87   Temp 98 F (36.7 C)   Ht 5\' 3"  (1.6 m)   Wt 186 lb (84.4 kg)   LMP 04/14/2012   SpO2 97%   BMI 32.95 kg/m    Subjective:   Patient ID: Brianna [name], female    DOB: 1960-10-20, 61 y.o.   MRN: [id number]  HPI: Brianna [name] is a 61 y.o. female presenting on 08/07/2021 for Medical Management of Chronic Issues and Weight Management Screening (Has tried OTC meds, fasting, changing diet- nothing helps her lose weight)   HPI Obesity and weight management Patient is coming in for obesity and weight management.  She has tried multiple diets including low sugar diet for a month and keto pills diet for a month and intermittent fasting for a month and she has been walking and trying to be more active and she said her daughter is losing weight and she is not.  She would like to try something to help with her.  She still feels like she is bloated in the abdomen area and just not losing weight.  Relevant past medical, surgical, family and social history reviewed and updated as indicated. Interim medical history since our last visit reviewed. Allergies and medications reviewed and updated.  Review of Systems  Constitutional:  Negative for chills and fever.  Eyes:  Negative for visual disturbance.  Respiratory:  Negative for chest tightness and shortness of breath.   Cardiovascular:  Negative for chest pain and leg swelling.  Musculoskeletal:  Negative for back pain and gait problem.  Skin:  Negative for rash.  Neurological:  Negative for dizziness, light-headedness and headaches.  Psychiatric/Behavioral:  Negative for agitation and behavioral problems.   All other systems reviewed and are negative.   Per HPI unless specifically indicated above   Allergies as of 08/07/2021       Reactions   Tylenol With Codeine #3 [acetaminophen-codeine] Other (See Comments)   headache        Medication List        Accurate as of August 07, 2021  2:57 PM. If you  have any questions, ask your nurse or doctor.          aspirin-acetaminophen-caffeine 250-250-65 MG tablet Commonly known as: EXCEDRIN MIGRAINE Take 2 tablets by mouth every 8 (eight) hours as needed for headache.   atorvastatin 20 MG tablet Commonly known as: LIPITOR Take 1 tablet (20 mg total) by mouth daily.   citalopram 40 MG tablet Commonly known as: CELEXA Take 1 tablet (40 mg total) by mouth daily.   dicyclomine 10 MG capsule Commonly known as: BENTYL Take 1 capsule (10 mg total) by mouth 4 (four) times daily -  before meals and at bedtime.   famotidine 40 MG tablet Commonly known as: PEPCID TAKE ONE TABLET BY MOUTH AT BEDTIME   fluticasone 50 MCG/ACT nasal spray Commonly known as: FLONASE Place 1 spray into both nostrils 2 (two) times daily as needed for allergies or rhinitis.   furosemide 20 MG tablet Commonly known as: LASIX Take 1 tablet (20 mg total) by mouth daily.   lisinopril 10 MG tablet Commonly known as: ZESTRIL Take 1 tablet (10 mg total) by mouth daily.   loratadine 10 MG tablet Commonly known as: CLARITIN Take 1 tablet (10 mg total) by mouth daily.   pantoprazole 40 MG tablet Commonly known as: PROTONIX Take 1 tablet (40 mg total) by mouth 2 (two) times daily.   pregabalin 50 MG  capsule Commonly known as: Lyrica Take 1 capsule (50 mg total) by mouth 2 (two) times daily.   Wegovy 0.25 MG/0.5ML Soaj Generic drug: Semaglutide-Weight Management Inject 0.25 mg into the skin once a week. Started by: Brianna Radon Kentravious Lipford, MD         Objective:   BP 116/78   Pulse 87   Temp 98 F (36.7 C)   Ht 5\' 3"  (1.6 m)   Wt 186 lb (84.4 kg)   LMP 04/14/2012   SpO2 97%   BMI 32.95 kg/m   Wt Readings from Last 3 Encounters:  08/07/21 186 lb (84.4 kg)  07/03/21 183 lb (83 kg)  06/17/21 182 lb (82.6 kg)    Physical Exam Vitals and nursing note reviewed.  Constitutional:      General: She is not in acute distress.    Appearance: She is  well-developed. She is not diaphoretic.  Eyes:     Conjunctiva/sclera: Conjunctivae normal.  Cardiovascular:     Rate and Rhythm: Normal rate and regular rhythm.     Heart sounds: Normal heart sounds. No murmur heard. Pulmonary:     Effort: Pulmonary effort is normal. No respiratory distress.     Breath sounds: Normal breath sounds. No wheezing.  Musculoskeletal:        General: No tenderness. Normal range of motion.  Skin:    General: Skin is warm and dry.     Findings: No rash.  Neurological:     Mental Status: She is alert and oriented to person, place, and time.     Coordination: Coordination normal.  Psychiatric:        Behavior: Behavior normal.       Assessment & Plan:   Problem List Items Addressed This Visit       Other   Obesity (BMI 30.0-34.9) - Primary   Relevant Medications   Semaglutide-Weight Management (WEGOVY) 0.25 MG/0.5ML SOAJ    We will try for [facility], if that does not work then we may try phentermine. Follow up plan: Return in about 4 weeks (around 09/04/2021), or if symptoms worsen or fail to improve, for Obesity and weight management.  Counseling provided for all of the vaccine components No orders of the defined types were placed in this encounter.   Arville Care, MD [facility] 08/07/2021, 2:57 PM

## 2021-08-08 ENCOUNTER — Telehealth: Payer: Self-pay

## 2021-08-08 ENCOUNTER — Telehealth: Payer: Self-pay | Admitting: Family Medicine

## 2021-08-08 NOTE — Telephone Encounter (Signed)
Prior authorization for Brianna [name] has been denied, patient has been informed, and would like to know if you can prescribe something else instead.  [name] ([name]) Rx #: [id number] 0.'25MG'$ /0.5ML auto-injectors   Form [employer] Medicare Part D Electronic Prior Authorization Form (2017 NCPDP) Created 22 hours ago Sent to Plan 1 hour ago Plan Response 1 hour ago Submit Clinical Questions 1 hour ago Determination Unfavorable 1 hour ago Message from Plan Request Reference Number: [id number]. WEGOVY INJ 0.'25MG'$  is denied for not meeting the prior authorization requirement(s). Details of this decision are in the notice attached below or have been faxed to you.

## 2021-08-08 NOTE — Telephone Encounter (Signed)
Alaisha Barkett ([name]) Rx #: [id number] 0.'25MG'$ /0.5ML auto-injectors   Form [employer] Medicare Part D Electronic Prior Authorization Form (2017 NCPDP) Created 20 hours ago Sent to Plan 5 minutes ago Plan Response 4 minutes ago Submit Clinical Questions less than a minute ago Determination Wait for Determination Please wait for [employer] 2017 NCPDP to return a determination.

## 2021-08-11 ENCOUNTER — Other Ambulatory Visit: Payer: Self-pay | Admitting: Family Medicine

## 2021-08-11 MED ORDER — PHENTERMINE HCL 30 MG PO CAPS: 30.0000 mg | ORAL_CAPSULE | ORAL | 0 refills | Status: DC

## 2021-08-11 NOTE — Progress Notes (Signed)
Left message informing pt and to call back with any concerns.

## 2021-08-11 NOTE — Progress Notes (Signed)
It looks like Brianna [name] was not covered so I sent in phentermine 1 month for her, she should have an come back in a month

## 2021-08-26 DIAGNOSIS — Q7649 Other congenital malformations of spine, not associated with scoliosis: Secondary | ICD-10-CM | POA: Diagnosis not present

## 2021-08-26 DIAGNOSIS — G8929 Other chronic pain: Secondary | ICD-10-CM | POA: Diagnosis not present

## 2021-08-26 DIAGNOSIS — M545 Low back pain, unspecified: Secondary | ICD-10-CM | POA: Diagnosis not present

## 2021-09-05 ENCOUNTER — Encounter: Payer: Self-pay | Admitting: Family Medicine

## 2021-09-05 ENCOUNTER — Ambulatory Visit (INDEPENDENT_AMBULATORY_CARE_PROVIDER_SITE_OTHER): Payer: Medicare Other | Admitting: Family Medicine

## 2021-09-05 VITALS — BP 120/76 | HR 130 | Temp 98.4°F | Ht 63.0 in | Wt 180.0 lb

## 2021-09-05 DIAGNOSIS — E669 Obesity, unspecified: Secondary | ICD-10-CM

## 2021-09-05 DIAGNOSIS — R000 Tachycardia, unspecified: Secondary | ICD-10-CM

## 2021-09-05 DIAGNOSIS — E66811 Obesity, class 1: Secondary | ICD-10-CM

## 2021-09-05 MED ORDER — PHENTERMINE HCL 15 MG PO CAPS: 30.0000 mg | ORAL_CAPSULE | ORAL | 0 refills | Status: DC

## 2021-09-05 NOTE — Progress Notes (Signed)
BP 120/76   Pulse (!) 130   Temp 98.4 F (36.9 C)   Ht 5\' 3"  (1.6 m)   Wt 180 lb (81.6 kg)   LMP 04/14/2012   SpO2 96%   BMI 31.89 kg/m    Subjective:   Patient ID: Brianna [name], female    DOB: [birth year], 61 y.o.   MRN: [id number]  HPI: Brianna KLIM is a 61 y.o. female presenting on 09/05/2021 for Medical Management of Chronic Issues and Weight Check (Phentermine- wegovy denies by insurance)   HPI Obesity and weight check Patient is coming in today for obesity and weight check.  She did start the phentermine and has been doing it for about a month and she is down about 6 pounds in weight.  She does not feel any side effects but here in the office today is noted that she is tachycardic in the 120s and 130s and she is not normally.  She denies any chest pain or shortness of breath or palpitations.  She is happy because she is down 6 pounds.  Relevant past medical, surgical, family and social history reviewed and updated as indicated. Interim medical history since our last visit reviewed. Allergies and medications reviewed and updated.  Review of Systems  Constitutional:  Negative for chills and fever.  Eyes:  Negative for visual disturbance.  Respiratory:  Negative for chest tightness and shortness of breath.   Cardiovascular:  Negative for chest pain and leg swelling.  Musculoskeletal:  Negative for back pain and gait problem.  Skin:  Negative for rash.  Neurological:  Negative for dizziness, light-headedness and headaches.  Psychiatric/Behavioral:  Negative for agitation and behavioral problems.   All other systems reviewed and are negative.   Per HPI unless specifically indicated above   Allergies as of 09/05/2021       Reactions   Tylenol With Codeine #3 [acetaminophen-codeine] Other (See Comments)   headache        Medication List        Accurate as of September 05, 2021  4:05 PM. If you have any questions, ask your nurse or doctor.           STOP taking these medications    Wegovy 0.25 MG/0.5ML Soaj Generic drug: Semaglutide-Weight Management Stopped by: Elige Radon Lakishia Bourassa, MD       TAKE these medications    aspirin-acetaminophen-caffeine [id number] MG tablet Commonly known as: EXCEDRIN MIGRAINE Take 2 tablets by mouth every 8 (eight) hours as needed for headache.   atorvastatin 20 MG tablet Commonly known as: LIPITOR Take 1 tablet (20 mg total) by mouth daily.   citalopram 40 MG tablet Commonly known as: CELEXA Take 1 tablet (40 mg total) by mouth daily.   dicyclomine 10 MG capsule Commonly known as: BENTYL Take 1 capsule (10 mg total) by mouth 4 (four) times daily -  before meals and at bedtime.   famotidine 40 MG tablet Commonly known as: PEPCID TAKE ONE TABLET BY MOUTH AT BEDTIME   fluticasone 50 MCG/ACT nasal spray Commonly known as: FLONASE Place 1 spray into both nostrils 2 (two) times daily as needed for allergies or rhinitis.   furosemide 20 MG tablet Commonly known as: LASIX Take 1 tablet (20 mg total) by mouth daily.   lisinopril 10 MG tablet Commonly known as: ZESTRIL Take 1 tablet (10 mg total) by mouth daily.   loratadine 10 MG tablet Commonly known as: CLARITIN Take 1 tablet (10 mg total) by mouth daily.  pantoprazole 40 MG tablet Commonly known as: PROTONIX Take 1 tablet (40 mg total) by mouth 2 (two) times daily.   phentermine 15 MG capsule Take 2 capsules (30 mg total) by mouth every morning. What changed: medication strength Changed by: Elige Radon Cowan Pilar, MD   pregabalin 50 MG capsule Commonly known as: Lyrica Take 1 capsule (50 mg total) by mouth 2 (two) times daily.         Objective:   BP 120/76   Pulse (!) 130   Temp 98.4 F (36.9 C)   Ht 5\' 3"  (1.6 m)   Wt 180 lb (81.6 kg)   LMP 04/14/2012   SpO2 96%   BMI 31.89 kg/m   Wt Readings from Last 3 Encounters:  09/05/21 180 lb (81.6 kg)  08/07/21 186 lb (84.4 kg)  07/03/21 183 lb (83 kg)    Physical  Exam Vitals and nursing note reviewed.  Constitutional:      General: She is not in acute distress.    Appearance: Normal appearance. She is well-developed. She is not diaphoretic.  Eyes:     Conjunctiva/sclera: Conjunctivae normal.  Cardiovascular:     Rate and Rhythm: Regular rhythm. Tachycardia present.     Heart sounds: Normal heart sounds. No murmur heard. Pulmonary:     Effort: Pulmonary effort is normal. No respiratory distress.     Breath sounds: Normal breath sounds. No wheezing.  Musculoskeletal:        General: No swelling or tenderness. Normal range of motion.  Skin:    General: Skin is warm and dry.     Findings: No rash.  Neurological:     Mental Status: She is alert and oriented to person, place, and time.     Coordination: Coordination normal.  Psychiatric:        Behavior: Behavior normal.       Assessment & Plan:   Problem List Items Addressed This Visit       Other   Obesity (BMI 30.0-34.9) - Primary   Other Visit Diagnoses     Tachycardia       Relevant Medications   phentermine 15 MG capsule   Other Relevant Orders   EKG 12-Lead (Completed)     EKG: Sinus tachycardia Will lower the dose of phentermine and monitor tachycardia.  Follow up plan: Return if symptoms worsen or fail to improve, for obesity recheck.  Counseling provided for all of the vaccine components Orders Placed This Encounter  Procedures   EKG 12-Lead    Arville Care, MD [facility] 09/05/2021, 4:05 PM

## 2021-09-09 ENCOUNTER — Other Ambulatory Visit: Payer: Self-pay | Admitting: Gastroenterology

## 2021-09-09 ENCOUNTER — Other Ambulatory Visit: Payer: Self-pay | Admitting: Family Medicine

## 2021-09-09 DIAGNOSIS — R609 Edema, unspecified: Secondary | ICD-10-CM

## 2021-09-09 DIAGNOSIS — I10 Essential (primary) hypertension: Secondary | ICD-10-CM

## 2021-09-17 ENCOUNTER — Ambulatory Visit: Payer: Medicare Other | Admitting: Gastroenterology

## 2021-09-17 NOTE — Progress Notes (Deleted)
GI Office Note    Referring Provider: Dettinger, Fransisca Kaufmann, MD Primary Care Physician:  Dettinger, Fransisca Kaufmann, MD  Primary Gastroenterologist: Garfield Cornea, MD   Chief Complaint   No chief complaint on file.   History of Present Illness   Brianna [name] is a 61 y.o. female presenting today for follow-up.  History of GERD.  Seen at last visit back in June for intermittent abdominal pain and postprandial diarrhea most consistent with IBS.  Also noted to have mildly elevated alkaline phosphatase and ALT.  Repeat labs in June 2023 with normal LFTs.  CRP 0.7, IgA 172, TTG IgA less than 1.  White blood cell count 7400, hemoglobin 13.1, platelets 342,000.  TSH 2.060.   EGD October 2021: -Mild erosive reflux esophagitis. Dilated. -Medium-sized hiatal hernia. -Normal duodenal bulb and second portion of the duodenum.  Colonoscopy October 2015: -Colonic diverticulosis; paper hematochezia likely from benign anorectal irritation.  Medications   Current Outpatient Medications  Medication Sig Dispense Refill   aspirin-acetaminophen-caffeine (EXCEDRIN MIGRAINE) 250-250-65 MG tablet Take 2 tablets by mouth every 8 (eight) hours as needed for headache.     atorvastatin (LIPITOR) 20 MG tablet Take 1 tablet (20 mg total) by mouth daily. 90 tablet 3   citalopram (CELEXA) 40 MG tablet Take 1 tablet (40 mg total) by mouth daily. 90 tablet 1   dicyclomine (BENTYL) 10 MG capsule Take 1 capsule (10 mg total) by mouth 4 (four) times daily -  before meals and at bedtime. 30 capsule 3   famotidine (PEPCID) 40 MG tablet TAKE ONE TABLET BY MOUTH AT BEDTIME 30 tablet 3   fluticasone (FLONASE) 50 MCG/ACT nasal spray Place 1 spray into both nostrils 2 (two) times daily as needed for allergies or rhinitis. 16 g 6   furosemide (LASIX) 20 MG tablet Take 1 tablet (20 mg total) by mouth daily. 90 tablet 3   lisinopril (ZESTRIL) 10 MG tablet Take 1 tablet (10 mg total) by mouth daily. 90 tablet 3    loratadine (CLARITIN) 10 MG tablet Take 1 tablet (10 mg total) by mouth daily. 90 tablet 3   pantoprazole (PROTONIX) 40 MG tablet Take 1 tablet (40 mg total) by mouth 2 (two) times daily. 180 tablet 1   phentermine 15 MG capsule Take 2 capsules (30 mg total) by mouth every morning. 30 capsule 0   pregabalin (LYRICA) 50 MG capsule Take 1 capsule (50 mg total) by mouth 2 (two) times daily. 60 capsule 5   No current facility-administered medications for this visit.    Allergies   Allergies as of 09/17/2021 - Review Complete 09/05/2021  Allergen Reaction Noted   Tylenol with codeine #3 [acetaminophen-codeine] Other (See Comments) 05/24/2014     Past Medical History   Past Medical History:  Diagnosis Date   Anemia    Anxiety    Arthritis    Chronic back pain    Chronic neck pain    Chronic right hip pain since 09/2012   DDD (degenerative disc disease), cervical    Depression    Diverticulitis    Fibromyalgia    GERD (gastroesophageal reflux disease)    Headache    Hypertension    Pancreatitis     Past Surgical History   Past Surgical History:  Procedure Laterality Date   CESAREAN SECTION     CHOLECYSTECTOMY N/A 08/16/2014   Procedure: LAPAROSCOPIC CHOLECYSTECTOMY WITH CHOLANGIOGRAM;  Surgeon: Sherri Rad, MD;  Location: [facility] ORS;  Service: General;  Laterality: N/A;  COLONOSCOPY N/A 08/22/2013   [id number] diverticulosis paper hemtochezia from anorectal irritation   ESOPHAGOGASTRODUODENOSCOPY N/A 08/22/2013   [id number] reflux esophagitis/noncritical schatzki's ring and hiatial hernia   ESOPHAGOGASTRODUODENOSCOPY (EGD) WITH PROPOFOL N/A 01/04/2020     Surgeon: Daneil Dolin, MD; Erosive reflux esophagitis s/p dilation, medium-sized hiatal hernia, normal examined duodenum.   MALONEY DILATION N/A 01/04/2020   Procedure: Venia Minks DILATION;  Surgeon: Daneil Dolin, MD;  Location: [facility];  Service: Endoscopy;  Laterality: N/A;   OOPHORECTOMY      Past Family History    Family History  Problem Relation Age of Onset   Ovarian cancer Mother    Gallbladder disease Son    Colon cancer Neg Hx     Past Social History   Social History   Socioeconomic History   Marital status: Widowed    Spouse name: Not on file   Number of children: 4   Years of education: Not on file   Highest education level: Not on file  Occupational History    Comment: hasn't worked since October 2014. Filing for disability  Tobacco Use   Smoking status: Former    Packs/day: 0.50    Years: 2.00    Total pack years: 1.00    Types: Cigarettes    Quit date: 01/06/2007    Years since quitting: 14.7   Smokeless tobacco: Never  Vaping Use   Vaping Use: Never used  Substance and Sexual Activity   Alcohol use: No   Drug use: No   Sexual activity: Not Currently  Other Topics Concern   Not on file  Social History Narrative   10 grandchildren.    Social Determinants of Health   Financial Resource Strain: Low Risk  (02/03/2021)   Overall Financial Resource Strain (CARDIA)    Difficulty of Paying Living Expenses: Not hard at all  Food Insecurity: No Food Insecurity (02/03/2021)   Hunger Vital Sign    Worried About Running Out of Food in the Last Year: Never true    Ran Out of Food in the Last Year: Never true  Transportation Needs: No Transportation Needs (02/03/2021)   PRAPARE - Hydrologist (Medical): No    Lack of Transportation (Non-Medical): No  Physical Activity: Insufficiently Active (02/03/2021)   Exercise Vital Sign    Days of Exercise per Week: 7 days    Minutes of Exercise per Session: 20 min  Stress: No Stress Concern Present (02/03/2021)   [place]    Feeling of Stress : Not at all  Social Connections: Moderately Integrated (02/03/2021)   Social Connection and Isolation Panel [NHANES]    Frequency of Communication with Friends and Family: More than three times a week     Frequency of Social Gatherings with Friends and Family: More than three times a week    Attends Religious Services: More than 4 times per year    Active Member of [employer] or Organizations: Yes    Attends Archivist Meetings: More than 4 times per year    Marital Status: Widowed  Intimate Partner Violence: Not At Risk (02/03/2021)   Humiliation, Afraid, Rape, and Kick questionnaire    Fear of Current or Ex-Partner: No    Emotionally Abused: No    Physically Abused: No    Sexually Abused: No    Review of Systems   General: Negative for anorexia, weight loss, fever, chills, fatigue, weakness. ENT: Negative for hoarseness,  difficulty swallowing , nasal congestion. CV: Negative for chest pain, angina, palpitations, dyspnea on exertion, peripheral edema.  Respiratory: Negative for dyspnea at rest, dyspnea on exertion, cough, sputum, wheezing.  GI: See history of present illness. GU:  Negative for dysuria, hematuria, urinary incontinence, urinary frequency, nocturnal urination.  Endo: Negative for unusual weight change.     Physical Exam   LMP 04/14/2012    General: Well-nourished, well-developed in no acute distress.  Eyes: No icterus. Mouth: Oropharyngeal mucosa moist and pink , no lesions erythema or exudate. Lungs: Clear to auscultation bilaterally.  Heart: Regular rate and rhythm, no murmurs rubs or gallops.  Abdomen: Bowel sounds are normal, nontender, nondistended, no hepatosplenomegaly or masses,  no abdominal bruits or hernia , no rebound or guarding.  Rectal: ***  Extremities: No lower extremity edema. No clubbing or deformities. Neuro: Alert and oriented x 4   Skin: Warm and dry, no jaundice.   Psych: Alert and cooperative, normal mood and affect.  Labs   *** Imaging Studies   No results found.  Assessment       PLAN   ***   Laureen Ochs. Bobby Rumpf, [place], [facility]

## 2021-09-22 DIAGNOSIS — Z20822 Contact with and (suspected) exposure to covid-19: Secondary | ICD-10-CM | POA: Diagnosis not present

## 2021-09-25 ENCOUNTER — Other Ambulatory Visit: Payer: Self-pay | Admitting: Internal Medicine

## 2021-10-06 ENCOUNTER — Ambulatory Visit: Payer: Medicare Other | Admitting: Family Medicine

## 2021-10-09 ENCOUNTER — Ambulatory Visit (INDEPENDENT_AMBULATORY_CARE_PROVIDER_SITE_OTHER): Payer: Medicare Other | Admitting: Family Medicine

## 2021-10-09 ENCOUNTER — Encounter: Payer: Self-pay | Admitting: Family Medicine

## 2021-10-09 VITALS — BP 108/76 | HR 72 | Temp 98.2°F | Ht 63.0 in | Wt 179.0 lb

## 2021-10-09 DIAGNOSIS — R000 Tachycardia, unspecified: Secondary | ICD-10-CM | POA: Diagnosis not present

## 2021-10-09 DIAGNOSIS — E669 Obesity, unspecified: Secondary | ICD-10-CM | POA: Diagnosis not present

## 2021-10-09 MED ORDER — PHENTERMINE HCL 15 MG PO CAPS: 30.0000 mg | ORAL_CAPSULE | ORAL | 0 refills | Status: DC

## 2021-10-09 NOTE — Progress Notes (Signed)
BP 108/76   Pulse 72   Temp 98.2 F (36.8 C)   Ht '5\' 3"'$  (1.6 m)   Wt 179 lb (81.2 kg)   LMP 04/14/2012   SpO2 96%   BMI 31.71 kg/m    Subjective:   Patient ID: Brianna [name], female    DOB: 08-Apr-1960, 61 y.o.   MRN: [id number]  HPI: Brianna [name] is a 61 y.o. female presenting on 10/09/2021 for Weight Check (Phentermine)   HPI Obesity and weight check Patient is coming in for obesity and weight check.  She was lowered on the phentermine the last time we saw her because of increased heart rate.  Today her heart rate is 72.  She is still down a pound and maintaining.  She does not feel as much energy or suppression of her appetite as she did with the other but her heart rate is better.  Relevant past medical, surgical, family and social history reviewed and updated as indicated. Interim medical history since our last visit reviewed. Allergies and medications reviewed and updated.  Review of Systems  Constitutional:  Negative for chills and fever.  Eyes:  Negative for visual disturbance.  Respiratory:  Negative for chest tightness and shortness of breath.   Cardiovascular:  Negative for chest pain, palpitations and leg swelling.  Musculoskeletal:  Negative for back pain and gait problem.  Skin:  Negative for rash.  Neurological:  Negative for dizziness, light-headedness and headaches.  Psychiatric/Behavioral:  Negative for agitation and behavioral problems.   All other systems reviewed and are negative.   Per HPI unless specifically indicated above   Allergies as of 10/09/2021       Reactions   Tylenol With Codeine #3 [acetaminophen-codeine] Other (See Comments)   headache        Medication List        Accurate as of October 09, 2021  3:53 PM. If you have any questions, ask your nurse or doctor.          aspirin-acetaminophen-caffeine 250-250-65 MG tablet Commonly known as: EXCEDRIN MIGRAINE Take 2 tablets by mouth every 8 (eight) hours as needed  for headache.   atorvastatin 20 MG tablet Commonly known as: LIPITOR Take 1 tablet (20 mg total) by mouth daily.   citalopram 40 MG tablet Commonly known as: CELEXA Take 1 tablet (40 mg total) by mouth daily.   dicyclomine 10 MG capsule Commonly known as: BENTYL TAKE ONE CAPSULE BY MOUTH FOUR TIMES DAILY - BEFORE MEALS AND AT BEDTIME   famotidine 40 MG tablet Commonly known as: PEPCID TAKE ONE TABLET BY MOUTH AT BEDTIME   fluticasone 50 MCG/ACT nasal spray Commonly known as: FLONASE Place 1 spray into both nostrils 2 (two) times daily as needed for allergies or rhinitis.   furosemide 20 MG tablet Commonly known as: LASIX Take 1 tablet (20 mg total) by mouth daily.   lisinopril 10 MG tablet Commonly known as: ZESTRIL Take 1 tablet (10 mg total) by mouth daily.   loratadine 10 MG tablet Commonly known as: CLARITIN Take 1 tablet (10 mg total) by mouth daily.   pantoprazole 40 MG tablet Commonly known as: PROTONIX Take 1 tablet (40 mg total) by mouth 2 (two) times daily.   phentermine 15 MG capsule Take 2 capsules (30 mg total) by mouth every morning.   pregabalin 50 MG capsule Commonly known as: Lyrica Take 1 capsule (50 mg total) by mouth 2 (two) times daily.  Objective:   BP 108/76   Pulse 72   Temp 98.2 F (36.8 C)   Ht '5\' 3"'$  (1.6 m)   Wt 179 lb (81.2 kg)   LMP 04/14/2012   SpO2 96%   BMI 31.71 kg/m   Wt Readings from Last 3 Encounters:  10/09/21 179 lb (81.2 kg)  09/05/21 180 lb (81.6 kg)  08/07/21 186 lb (84.4 kg)    Physical Exam Vitals and nursing note reviewed.  Constitutional:      General: She is not in acute distress.    Appearance: She is well-developed. She is not diaphoretic.  Eyes:     Conjunctiva/sclera: Conjunctivae normal.  Skin:    General: Skin is warm and dry.     Findings: No rash.  Neurological:     Mental Status: She is alert and oriented to person, place, and time.     Coordination: Coordination normal.   Psychiatric:        Behavior: Behavior normal.       Assessment & Plan:   Problem List Items Addressed This Visit       Other   Obesity (BMI 30.0-34.9) - Primary   Relevant Medications   phentermine 15 MG capsule   Other Visit Diagnoses     Tachycardia       Relevant Medications   phentermine 15 MG capsule     We will continue phentermine for another month with a lower dose, her heart rate looks a lot better in the 70s.  She denies any chest pain or palpitations.  Follow up plan: Return if symptoms worsen or fail to improve.  Counseling provided for all of the vaccine components No orders of the defined types were placed in this encounter.   Caryl Pina, MD [facility] 10/09/2021, 3:53 PM

## 2021-11-10 ENCOUNTER — Ambulatory Visit (INDEPENDENT_AMBULATORY_CARE_PROVIDER_SITE_OTHER): Payer: Medicare Other | Admitting: Family Medicine

## 2021-11-10 ENCOUNTER — Encounter: Payer: Self-pay | Admitting: Family Medicine

## 2021-11-10 DIAGNOSIS — R000 Tachycardia, unspecified: Secondary | ICD-10-CM

## 2021-11-10 DIAGNOSIS — E669 Obesity, unspecified: Secondary | ICD-10-CM

## 2021-11-10 DIAGNOSIS — E66811 Obesity, class 1: Secondary | ICD-10-CM

## 2021-11-10 MED ORDER — PHENTERMINE HCL 15 MG PO CAPS: 15.0000 mg | ORAL_CAPSULE | ORAL | 0 refills | Status: DC

## 2021-11-10 NOTE — Progress Notes (Signed)
BP 110/75   Pulse 100   Temp 98 F (36.7 C)   Ht '5\' 3"'$  (1.6 m)   Wt 177 lb (80.3 kg)   LMP 04/14/2012   SpO2 96%   BMI 31.35 kg/m    Subjective:   Patient ID: Brianna [name], female    DOB: 03-Jan-1961, 61 y.o.   MRN: [id number]  HPI: Brianna [name] is a 61 y.o. female presenting on 11/10/2021 for Medical Management of Chronic Issues and Weight Check   HPI Obesity and weight recheck Patient is coming in today for obesity and weight recheck.  She has been taking phentermine 15 mg daily.  She denies any chest pain or palpitations or flutters.  She is currently taking the 15 mg tablet every day and feels like it is helping with her appetite suppression.  We will continue for another month.  Relevant past medical, surgical, family and social history reviewed and updated as indicated. Interim medical history since our last visit reviewed. Allergies and medications reviewed and updated.  Review of Systems  Constitutional:  Negative for chills and fever.  Eyes:  Negative for visual disturbance.  Respiratory:  Negative for chest tightness and shortness of breath.   Cardiovascular:  Negative for chest pain, palpitations and leg swelling.  Neurological:  Negative for dizziness, light-headedness and headaches.  Psychiatric/Behavioral:  Negative for agitation and behavioral problems.   All other systems reviewed and are negative.   Per HPI unless specifically indicated above   Allergies as of 11/10/2021       Reactions   Tylenol With Codeine #3 [acetaminophen-codeine] Other (See Comments)   headache        Medication List        Accurate as of November 10, 2021  9:43 AM. If you have any questions, ask your nurse or doctor.          aspirin-acetaminophen-caffeine 250-250-65 MG tablet Commonly known as: EXCEDRIN MIGRAINE Take 2 tablets by mouth every 8 (eight) hours as needed for headache.   atorvastatin 20 MG tablet Commonly known as: LIPITOR Take 1 tablet (20  mg total) by mouth daily.   citalopram 40 MG tablet Commonly known as: CELEXA Take 1 tablet (40 mg total) by mouth daily.   dicyclomine 10 MG capsule Commonly known as: BENTYL TAKE ONE CAPSULE BY MOUTH FOUR TIMES DAILY - BEFORE MEALS AND AT BEDTIME   famotidine 40 MG tablet Commonly known as: PEPCID TAKE ONE TABLET BY MOUTH AT BEDTIME   fluticasone 50 MCG/ACT nasal spray Commonly known as: FLONASE Place 1 spray into both nostrils 2 (two) times daily as needed for allergies or rhinitis.   furosemide 20 MG tablet Commonly known as: LASIX Take 1 tablet (20 mg total) by mouth daily.   lisinopril 10 MG tablet Commonly known as: ZESTRIL Take 1 tablet (10 mg total) by mouth daily.   loratadine 10 MG tablet Commonly known as: CLARITIN Take 1 tablet (10 mg total) by mouth daily.   pantoprazole 40 MG tablet Commonly known as: PROTONIX Take 1 tablet (40 mg total) by mouth 2 (two) times daily.   phentermine 15 MG capsule Take 1 capsule (15 mg total) by mouth every morning. What changed: how much to take Changed by: Worthy Rancher, MD   pregabalin 50 MG capsule Commonly known as: Lyrica Take 1 capsule (50 mg total) by mouth 2 (two) times daily.         Objective:   BP 110/75   Pulse 100  Temp 98 F (36.7 C)   Ht '5\' 3"'$  (1.6 m)   Wt 177 lb (80.3 kg)   LMP 04/14/2012   SpO2 96%   BMI 31.35 kg/m   Wt Readings from Last 3 Encounters:  11/10/21 177 lb (80.3 kg)  10/09/21 179 lb (81.2 kg)  09/05/21 180 lb (81.6 kg)    Physical Exam Vitals and nursing note reviewed.  Constitutional:      General: She is not in acute distress.    Appearance: She is well-developed. She is not diaphoretic.  Eyes:     Conjunctiva/sclera: Conjunctivae normal.  Cardiovascular:     Rate and Rhythm: Normal rate and regular rhythm.     Heart sounds: Normal heart sounds. No murmur heard. Pulmonary:     Effort: Pulmonary effort is normal. No respiratory distress.     Breath sounds:  Normal breath sounds. No wheezing.  Skin:    General: Skin is warm and dry.     Findings: No rash.  Neurological:     Mental Status: She is alert and oriented to person, place, and time.     Coordination: Coordination normal.  Psychiatric:        Behavior: Behavior normal.       Assessment & Plan:   Problem List Items Addressed This Visit       Other   Obesity (BMI 30.0-34.9)   Relevant Medications   phentermine 15 MG capsule   Other Visit Diagnoses     Tachycardia       Relevant Medications   phentermine 15 MG capsule       Continue the phentermine 15 mg daily, she seems to be doing better from a heart rate standpoint on that. Follow up plan: Return in about 4 weeks (around 12/08/2021), or if symptoms worsen or fail to improve, for Obesity and weight recheck.  Counseling provided for all of the vaccine components No orders of the defined types were placed in this encounter.   Caryl Pina, MD [facility] 11/10/2021, 9:43 AM

## 2021-11-11 ENCOUNTER — Other Ambulatory Visit: Payer: Self-pay | Admitting: Family Medicine

## 2021-12-08 ENCOUNTER — Encounter: Payer: Self-pay | Admitting: Family Medicine

## 2021-12-08 ENCOUNTER — Ambulatory Visit (INDEPENDENT_AMBULATORY_CARE_PROVIDER_SITE_OTHER): Payer: Medicare Other | Admitting: Family Medicine

## 2021-12-08 DIAGNOSIS — Z713 Dietary counseling and surveillance: Secondary | ICD-10-CM

## 2021-12-08 DIAGNOSIS — E669 Obesity, unspecified: Secondary | ICD-10-CM | POA: Diagnosis not present

## 2021-12-08 DIAGNOSIS — E66811 Obesity, class 1: Secondary | ICD-10-CM

## 2021-12-08 MED ORDER — PHENTERMINE HCL 15 MG PO CAPS: 15.0000 mg | ORAL_CAPSULE | ORAL | 0 refills | Status: DC

## 2021-12-08 NOTE — Progress Notes (Signed)
BP 138/87   Pulse 97   Temp (!) 97.3 F (36.3 C)   Ht '5\' 3"'$  (1.6 m)   Wt 173 lb (78.5 kg)   LMP 04/14/2012   SpO2 95%   BMI 30.65 kg/m    Subjective:   Patient ID: Brianna [name], female    DOB: June 20, 1960, 61 y.o.   MRN: [id number]  HPI: Brianna [name] is a 61 y.o. female presenting on 12/08/2021 for Obesity (One month weight check. Wt 179lb last OV)   HPI Obesity and weight management recheck Patient is coming in today for obesity and weight management recheck.  She has been taking the phentermine for 3 months, she did not tolerate higher dose because of tachycardia but the lower dose has been doing okay for it.  She is down 6 pounds from last week.  She denies any major side effects.  Relevant past medical, surgical, family and social history reviewed and updated as indicated. Interim medical history since our last visit reviewed. Allergies and medications reviewed and updated.  Review of Systems  Constitutional:  Negative for chills and fever.  Eyes:  Negative for visual disturbance.  Respiratory:  Negative for chest tightness and shortness of breath.   Cardiovascular:  Negative for chest pain, palpitations and leg swelling.  Skin:  Negative for rash.  Neurological:  Negative for light-headedness and headaches.  Psychiatric/Behavioral:  Negative for agitation and behavioral problems.   All other systems reviewed and are negative.   Per HPI unless specifically indicated above   Allergies as of 12/08/2021       Reactions   Tylenol With Codeine #3 [acetaminophen-codeine] Other (See Comments)   headache        Medication List        Accurate as of December 08, 2021  9:14 AM. If you have any questions, ask your nurse or doctor.          Allergy Relief 10 MG tablet Generic drug: loratadine TAKE ONE TABLET BY MOUTH DAILY   aspirin-acetaminophen-caffeine 250-250-65 MG tablet Commonly known as: EXCEDRIN MIGRAINE Take 2 tablets by mouth every 8  (eight) hours as needed for headache.   atorvastatin 20 MG tablet Commonly known as: LIPITOR Take 1 tablet (20 mg total) by mouth daily.   citalopram 40 MG tablet Commonly known as: CELEXA Take 1 tablet (40 mg total) by mouth daily.   dicyclomine 10 MG capsule Commonly known as: BENTYL TAKE ONE CAPSULE BY MOUTH FOUR TIMES DAILY - BEFORE MEALS AND AT BEDTIME   famotidine 40 MG tablet Commonly known as: PEPCID TAKE ONE TABLET BY MOUTH AT BEDTIME   fluticasone 50 MCG/ACT nasal spray Commonly known as: FLONASE Place 1 spray into both nostrils 2 (two) times daily as needed for allergies or rhinitis.   furosemide 20 MG tablet Commonly known as: LASIX Take 1 tablet (20 mg total) by mouth daily.   lisinopril 10 MG tablet Commonly known as: ZESTRIL Take 1 tablet (10 mg total) by mouth daily.   pantoprazole 40 MG tablet Commonly known as: PROTONIX Take 1 tablet (40 mg total) by mouth 2 (two) times daily.   phentermine 15 MG capsule Take 1 capsule (15 mg total) by mouth every morning.   pregabalin 50 MG capsule Commonly known as: Lyrica Take 1 capsule (50 mg total) by mouth 2 (two) times daily.         Objective:   BP 138/87   Pulse 97   Temp (!) 97.3 F (36.3 C)  Ht '5\' 3"'$  (1.6 m)   Wt 173 lb (78.5 kg)   LMP 04/14/2012   SpO2 95%   BMI 30.65 kg/m   Wt Readings from Last 3 Encounters:  12/08/21 173 lb (78.5 kg)  11/10/21 177 lb (80.3 kg)  10/09/21 179 lb (81.2 kg)    Physical Exam Vitals and nursing note reviewed.  Constitutional:      General: She is not in acute distress.    Appearance: She is well-developed. She is not diaphoretic.  Eyes:     Conjunctiva/sclera: Conjunctivae normal.  Cardiovascular:     Rate and Rhythm: Normal rate and regular rhythm.     Heart sounds: Normal heart sounds. No murmur heard. Pulmonary:     Effort: Pulmonary effort is normal. No respiratory distress.     Breath sounds: Normal breath sounds. No wheezing.   Musculoskeletal:        General: No tenderness. Normal range of motion.  Skin:    General: Skin is warm and dry.     Findings: No rash.  Neurological:     Mental Status: She is alert and oriented to person, place, and time.     Coordination: Coordination normal.  Psychiatric:        Behavior: Behavior normal.       Assessment & Plan:   Problem List Items Addressed This Visit       Other   Obesity (BMI 30.0-34.9)   Relevant Medications   phentermine 15 MG capsule    Continue phentermine for another month.  Will reassess from there.  This will be month 4 Follow up plan: Return in about 4 weeks (around 01/05/2022), or if symptoms worsen or fail to improve, for Morbid obesity and recheck hypertension and blood work.  Counseling provided for all of the vaccine components No orders of the defined types were placed in this encounter.   Caryl Pina, MD [facility] 12/08/2021, 9:14 AM

## 2021-12-10 ENCOUNTER — Ambulatory Visit: Payer: Medicare Other | Admitting: Family Medicine

## 2022-01-07 ENCOUNTER — Ambulatory Visit (INDEPENDENT_AMBULATORY_CARE_PROVIDER_SITE_OTHER): Payer: 59 | Admitting: Family Medicine

## 2022-01-07 ENCOUNTER — Encounter: Payer: Self-pay | Admitting: Family Medicine

## 2022-01-07 ENCOUNTER — Ambulatory Visit: Payer: Medicare Other | Admitting: Family Medicine

## 2022-01-07 VITALS — BP 112/78 | HR 95 | Ht 63.0 in | Wt 174.0 lb

## 2022-01-07 DIAGNOSIS — R609 Edema, unspecified: Secondary | ICD-10-CM | POA: Diagnosis not present

## 2022-01-07 DIAGNOSIS — I10 Essential (primary) hypertension: Secondary | ICD-10-CM

## 2022-01-07 DIAGNOSIS — Z6830 Body mass index (BMI) 30.0-30.9, adult: Secondary | ICD-10-CM

## 2022-01-07 DIAGNOSIS — K047 Periapical abscess without sinus: Secondary | ICD-10-CM | POA: Diagnosis not present

## 2022-01-07 DIAGNOSIS — F419 Anxiety disorder, unspecified: Secondary | ICD-10-CM

## 2022-01-07 DIAGNOSIS — F32A Depression, unspecified: Secondary | ICD-10-CM | POA: Diagnosis not present

## 2022-01-07 DIAGNOSIS — M797 Fibromyalgia: Secondary | ICD-10-CM | POA: Diagnosis not present

## 2022-01-07 DIAGNOSIS — E669 Obesity, unspecified: Secondary | ICD-10-CM

## 2022-01-07 DIAGNOSIS — E785 Hyperlipidemia, unspecified: Secondary | ICD-10-CM

## 2022-01-07 MED ORDER — AMOXICILLIN-POT CLAVULANATE 875-125 MG PO TABS: 1.0000 | ORAL_TABLET | Freq: Two times a day (BID) | ORAL | 0 refills | Status: DC

## 2022-01-07 MED ORDER — PHENTERMINE HCL 15 MG PO CAPS: 15.0000 mg | ORAL_CAPSULE | ORAL | 0 refills | Status: DC

## 2022-01-07 MED ORDER — PREGABALIN 50 MG PO CAPS: 50.0000 mg | ORAL_CAPSULE | Freq: Two times a day (BID) | ORAL | 5 refills | Status: DC

## 2022-01-07 MED ORDER — ATORVASTATIN CALCIUM 20 MG PO TABS: 20.0000 mg | ORAL_TABLET | Freq: Every day | ORAL | 3 refills | Status: DC

## 2022-01-07 MED ORDER — CITALOPRAM HYDROBROMIDE 40 MG PO TABS: 40.0000 mg | ORAL_TABLET | Freq: Every day | ORAL | 3 refills | Status: DC

## 2022-01-07 MED ORDER — FUROSEMIDE 20 MG PO TABS: 20.0000 mg | ORAL_TABLET | Freq: Every day | ORAL | 3 refills | Status: DC

## 2022-01-07 MED ORDER — LISINOPRIL 10 MG PO TABS: 10.0000 mg | ORAL_TABLET | Freq: Every day | ORAL | 3 refills | Status: DC

## 2022-01-07 NOTE — Addendum Note (Signed)
Addended by: Caryl Pina on: 01/07/2022 03:56 PM   Modules accepted: Orders

## 2022-01-07 NOTE — Progress Notes (Signed)
BP 112/78   Pulse 95   Ht '5\' 3"'$  (1.6 m)   Wt 174 lb (78.9 kg)   LMP 04/14/2012   SpO2 97%   BMI 30.82 kg/m    Subjective:   Patient ID: Brianna [name], female    DOB: [birth year], 62 y.o.   MRN: [id number]  HPI: Brianna [name] is a 62 y.o. female presenting on 01/07/2022 for Medical Management of Chronic Issues, Anxiety, Depression, and Obesity   HPI Obesity and weight recheck Patient is coming in for obesity and weight recheck.  She is currently on the phentermine.  She is up 1 pound from her last visit.  She is taking low-dose phentermine because of tachycardia on the higher dose.  With Christmas and New Year's being in the past month but does play a factor so she will refocus over the next month.  Hypertension Patient is currently on lisinopril and furosemide, and their blood pressure today is 112/78. Patient denies any lightheadedness or dizziness. Patient denies headaches, blurred vision, chest pains, shortness of breath, or weakness. Denies any side effects from medication and is content with current medication.   Hyperlipidemia Patient is coming in for recheck of his hyperlipidemia. The patient is currently taking atorvastatin. They deny any issues with myalgias or history of liver damage from it. They deny any focal numbness or weakness or chest pain.   Fibromyalgia and anxiety depression Patient is coming in for recheck for fibromyalgia and anxiety depression.  She feels like she is doing well on the citalopram and Lyrica and is happy to continue forward with them.  She denies any suicidal ideations or thoughts of hurting self.    01/07/2022    3:32 PM 12/08/2021    9:05 AM 11/10/2021    9:30 AM 10/09/2021    3:42 PM 09/05/2021    3:06 PM  Depression screen PHQ 2/9  Decreased Interest '1 1 1 1 1  '$ Down, Depressed, Hopeless 1 0 1 1 0  PHQ - 2 Score '2 1 2 2 1  '$ Altered sleeping '1 1 1 1 1  '$ Tired, decreased energy '1 1 1 1 1  '$ Change in appetite '1 1 1 1 1  '$ Feeling bad or  failure about yourself  0 0 0 0 0  Trouble concentrating '1 1 1 1 1  '$ Moving slowly or fidgety/restless 0 0 0 0 0  Suicidal thoughts 0 0 0 0 0  PHQ-9 Score '6 5 6 6 5  '$ Difficult doing work/chores Somewhat difficult Somewhat difficult Somewhat difficult Somewhat difficult Somewhat difficult    Patient has a right lower dental pain with what she feels like is infection and she feels like it is going up towards her ear.  She has been trying to get into her dentist but she has not been able to get into one yet.  Relevant past medical, surgical, family and social history reviewed and updated as indicated. Interim medical history since our last visit reviewed. Allergies and medications reviewed and updated.  Review of Systems  Constitutional:  Negative for chills and fever.  Eyes:  Negative for visual disturbance.  Respiratory:  Negative for chest tightness and shortness of breath.   Cardiovascular:  Negative for chest pain and leg swelling.  Genitourinary:  Negative for dysuria.  Musculoskeletal:  Negative for back pain and gait problem.  Skin:  Negative for rash.  Neurological:  Negative for dizziness, light-headedness and headaches.  Psychiatric/Behavioral:  Negative for agitation and behavioral problems.   All  other systems reviewed and are negative.   Per HPI unless specifically indicated above   Allergies as of 01/07/2022       Reactions   Tylenol With Codeine #3 [acetaminophen-codeine] Other (See Comments)   headache        Medication List        Accurate as of January 07, 2022  3:52 PM. If you have any questions, ask your nurse or doctor.          Allergy Relief 10 MG tablet Generic drug: loratadine TAKE ONE TABLET BY MOUTH DAILY   amoxicillin-clavulanate 875-125 MG tablet Commonly known as: AUGMENTIN Take 1 tablet by mouth 2 (two) times daily. Started by: Worthy Rancher, MD   aspirin-acetaminophen-caffeine [phone] MG tablet Commonly known as: EXCEDRIN  MIGRAINE Take 2 tablets by mouth every 8 (eight) hours as needed for headache.   atorvastatin 20 MG tablet Commonly known as: LIPITOR Take 1 tablet (20 mg total) by mouth daily.   citalopram 40 MG tablet Commonly known as: CELEXA Take 1 tablet (40 mg total) by mouth daily.   dicyclomine 10 MG capsule Commonly known as: BENTYL TAKE ONE CAPSULE BY MOUTH FOUR TIMES DAILY - BEFORE MEALS AND AT BEDTIME   famotidine 40 MG tablet Commonly known as: PEPCID TAKE ONE TABLET BY MOUTH AT BEDTIME   fluticasone 50 MCG/ACT nasal spray Commonly known as: FLONASE Place 1 spray into both nostrils 2 (two) times daily as needed for allergies or rhinitis.   furosemide 20 MG tablet Commonly known as: LASIX Take 1 tablet (20 mg total) by mouth daily.   lisinopril 10 MG tablet Commonly known as: ZESTRIL Take 1 tablet (10 mg total) by mouth daily.   pantoprazole 40 MG tablet Commonly known as: PROTONIX Take 1 tablet (40 mg total) by mouth 2 (two) times daily.   phentermine 15 MG capsule Take 1 capsule (15 mg total) by mouth every morning.   pregabalin 50 MG capsule Commonly known as: Lyrica Take 1 capsule (50 mg total) by mouth 2 (two) times daily.         Objective:   BP 112/78   Pulse 95   Ht '5\' 3"'$  (1.6 m)   Wt 174 lb (78.9 kg)   LMP 04/14/2012   SpO2 97%   BMI 30.82 kg/m   Wt Readings from Last 3 Encounters:  01/07/22 174 lb (78.9 kg)  12/08/21 173 lb (78.5 kg)  11/10/21 177 lb (80.3 kg)    Physical Exam Vitals and nursing note reviewed.  Constitutional:      General: She is not in acute distress.    Appearance: She is well-developed. She is not diaphoretic.  Eyes:     Conjunctiva/sclera: Conjunctivae normal.  Cardiovascular:     Rate and Rhythm: Normal rate and regular rhythm.     Heart sounds: Normal heart sounds. No murmur heard. Pulmonary:     Effort: Pulmonary effort is normal. No respiratory distress.     Breath sounds: Normal breath sounds. No wheezing.   Musculoskeletal:        General: No swelling or tenderness. Normal range of motion.  Skin:    General: Skin is warm and dry.     Findings: No rash.  Neurological:     Mental Status: She is alert and oriented to person, place, and time.     Coordination: Coordination normal.  Psychiatric:        Behavior: Behavior normal.       Assessment & Plan:  Problem List Items Addressed This Visit       Cardiovascular and Mediastinum   Essential hypertension, benign   Relevant Medications   lisinopril (ZESTRIL) 10 MG tablet   furosemide (LASIX) 20 MG tablet   atorvastatin (LIPITOR) 20 MG tablet     Other   Anxiety and depression   Relevant Medications   pregabalin (LYRICA) 50 MG capsule   citalopram (CELEXA) 40 MG tablet   Hyperlipidemia   Relevant Medications   lisinopril (ZESTRIL) 10 MG tablet   furosemide (LASIX) 20 MG tablet   atorvastatin (LIPITOR) 20 MG tablet   Fibromyalgia - Primary   Relevant Medications   pregabalin (LYRICA) 50 MG capsule   citalopram (CELEXA) 40 MG tablet   Obesity (BMI 30.0-34.9)   Relevant Medications   phentermine 15 MG capsule   Other Visit Diagnoses     Peripheral edema       Relevant Medications   furosemide (LASIX) 20 MG tablet   Dental infection       Relevant Medications   amoxicillin-clavulanate (AUGMENTIN) 875-125 MG tablet       Sent amoxicillin for the patient for dental infection and recommended that she get into a dentist.  Will continue forward with the phentermine and do another month checkup Follow up plan: Return in about 4 weeks (around 02/04/2022), or if symptoms worsen or fail to improve, for Obesity and weight.  Counseling provided for all of the vaccine components No orders of the defined types were placed in this encounter.   Caryl Pina, MD [facility] 01/07/2022, 3:52 PM

## 2022-01-08 LAB — LIPID PANEL
Chol/HDL Ratio: 1.9 ratio (ref 0.0–4.4)
Cholesterol, Total: 133 mg/dL (ref 100–199)
HDL: 71 mg/dL (ref >39)
LDL Chol Calc (NIH): 45 mg/dL (ref 0–99)
Triglycerides: 88 mg/dL (ref 0–149)
VLDL Cholesterol Cal: 17 mg/dL (ref 5–40)

## 2022-01-08 LAB — CBC WITH DIFFERENTIAL/PLATELET
Basophils Absolute: 0.1 10*3/uL (ref 0.0–0.2)
Basos: 1 %
EOS (ABSOLUTE): 0.1 10*3/uL (ref 0.0–0.4)
Eos: 2 %
Hematocrit: 38.1 % (ref 34.0–46.6)
Hemoglobin: 13 g/dL (ref 11.1–15.9)
Immature Grans (Abs): 0 10*3/uL (ref 0.0–0.1)
Immature Granulocytes: 0 %
Lymphocytes Absolute: 3 10*3/uL (ref 0.7–3.1)
Lymphs: 39 %
MCH: 28.5 pg (ref 26.6–33.0)
MCHC: 34.1 g/dL (ref 31.5–35.7)
MCV: 84 fL (ref 79–97)
Monocytes Absolute: 0.6 10*3/uL (ref 0.1–0.9)
Monocytes: 8 %
Neutrophils Absolute: 3.8 10*3/uL (ref 1.4–7.0)
Neutrophils: 50 %
Platelets: 361 10*3/uL (ref 150–450)
RBC: 4.56 x10E6/uL (ref 3.77–5.28)
RDW: 12.1 % (ref 11.7–15.4)
WBC: 7.6 10*3/uL (ref 3.4–10.8)

## 2022-01-08 LAB — CMP14+EGFR
ALT: 12 IU/L (ref 0–32)
AST: 18 IU/L (ref 0–40)
Albumin/Globulin Ratio: 1.5 (ref 1.2–2.2)
Albumin: 4 g/dL (ref 3.9–4.9)
Alkaline Phosphatase: 102 IU/L (ref 44–121)
BUN/Creatinine Ratio: 15 (ref 12–28)
BUN: 14 mg/dL (ref 8–27)
Bilirubin Total: 0.2 mg/dL (ref 0.0–1.2)
CO2: 22 mmol/L (ref 20–29)
Calcium: 9.3 mg/dL (ref 8.7–10.3)
Chloride: 107 mmol/L — ABNORMAL HIGH (ref 96–106)
Creatinine, Ser: 0.93 mg/dL (ref 0.57–1.00)
Globulin, Total: 2.7 g/dL (ref 1.5–4.5)
Glucose: 90 mg/dL (ref 70–99)
Potassium: 4.2 mmol/L (ref 3.5–5.2)
Sodium: 142 mmol/L (ref 134–144)
Total Protein: 6.7 g/dL (ref 6.0–8.5)
eGFR: 70 mL/min/{1.73_m2} (ref >59)

## 2022-01-08 LAB — TSH: TSH: 3.58 u[IU]/mL (ref 0.450–4.500)

## 2022-02-04 ENCOUNTER — Ambulatory Visit (INDEPENDENT_AMBULATORY_CARE_PROVIDER_SITE_OTHER): Payer: 59 | Admitting: Family Medicine

## 2022-02-04 ENCOUNTER — Encounter: Payer: Self-pay | Admitting: Family Medicine

## 2022-02-04 VITALS — BP 135/85 | HR 100 | Ht 63.0 in | Wt 170.4 lb

## 2022-02-04 DIAGNOSIS — E669 Obesity, unspecified: Secondary | ICD-10-CM

## 2022-02-04 DIAGNOSIS — Z6830 Body mass index (BMI) 30.0-30.9, adult: Secondary | ICD-10-CM

## 2022-02-04 MED ORDER — PHENTERMINE HCL 15 MG PO CAPS: 15.0000 mg | ORAL_CAPSULE | ORAL | 0 refills | Status: DC

## 2022-02-04 NOTE — Progress Notes (Signed)
BP 135/85   Pulse 100   Ht '5\' 3"'$  (1.6 m)   Wt 170 lb 6.4 oz (77.3 kg)   LMP 04/14/2012   SpO2 96%   BMI 30.19 kg/m    Subjective:   Patient ID: Brianna [name], female    DOB: 1960-09-03, 61 y.o.   MRN: [id number]  HPI: Brianna [name] is a 62 y.o. female presenting on 02/04/2022 for Medical Management of Chronic Issues and Weight Management Screening (Continues to take Phentermine '15mg'$  daily, denies side effects)   HPI Obesity and weight recheck Patient is coming in today for obesity and weight recheck.  She has been on phentermine for about 5 months now.  He is on 15 mg because she cannot tolerate more because of her heart rate and blood pressure.  She is down 3 pounds from last month.  She denies any side effects from the medicine.  Relevant past medical, surgical, family and social history reviewed and updated as indicated. Interim medical history since our last visit reviewed. Allergies and medications reviewed and updated.  Review of Systems  Constitutional:  Negative for chills and fever.  Eyes:  Negative for redness and visual disturbance.  Respiratory:  Negative for chest tightness and shortness of breath.   Cardiovascular:  Negative for chest pain, palpitations and leg swelling.  Genitourinary:  Negative for difficulty urinating and dysuria.  Musculoskeletal:  Negative for back pain and gait problem.  Skin:  Negative for rash.  Neurological:  Negative for light-headedness and headaches.  Psychiatric/Behavioral:  Negative for agitation and behavioral problems.   All other systems reviewed and are negative.   Per HPI unless specifically indicated above   Allergies as of 02/04/2022       Reactions   Tylenol With Codeine #3 [acetaminophen-codeine] Other (See Comments)   headache        Medication List        Accurate as of February 04, 2022  1:29 PM. If you have any questions, ask your nurse or doctor.          STOP taking these medications     amoxicillin-clavulanate 875-125 MG tablet Commonly known as: AUGMENTIN Stopped by: Fransisca Kaufmann Bayler Gehrig, MD       TAKE these medications    Allergy Relief 10 MG tablet Generic drug: loratadine TAKE ONE TABLET BY MOUTH DAILY   aspirin-acetaminophen-caffeine 250-250-65 MG tablet Commonly known as: EXCEDRIN MIGRAINE Take 2 tablets by mouth every 8 (eight) hours as needed for headache.   atorvastatin 20 MG tablet Commonly known as: LIPITOR Take 1 tablet (20 mg total) by mouth daily.   citalopram 40 MG tablet Commonly known as: CELEXA Take 1 tablet (40 mg total) by mouth daily.   dicyclomine 10 MG capsule Commonly known as: BENTYL TAKE ONE CAPSULE BY MOUTH FOUR TIMES DAILY - BEFORE MEALS AND AT BEDTIME   famotidine 40 MG tablet Commonly known as: PEPCID TAKE ONE TABLET BY MOUTH AT BEDTIME   fluticasone 50 MCG/ACT nasal spray Commonly known as: FLONASE Place 1 spray into both nostrils 2 (two) times daily as needed for allergies or rhinitis.   furosemide 20 MG tablet Commonly known as: LASIX Take 1 tablet (20 mg total) by mouth daily.   lisinopril 10 MG tablet Commonly known as: ZESTRIL Take 1 tablet (10 mg total) by mouth daily.   pantoprazole 40 MG tablet Commonly known as: PROTONIX Take 1 tablet (40 mg total) by mouth 2 (two) times daily.   phentermine 15 MG  capsule Take 1 capsule (15 mg total) by mouth every morning.   pregabalin 50 MG capsule Commonly known as: Lyrica Take 1 capsule (50 mg total) by mouth 2 (two) times daily.         Objective:   BP 135/85   Pulse 100   Ht '5\' 3"'$  (1.6 m)   Wt 170 lb 6.4 oz (77.3 kg)   LMP 04/14/2012   SpO2 96%   BMI 30.19 kg/m   Wt Readings from Last 3 Encounters:  02/04/22 170 lb 6.4 oz (77.3 kg)  01/07/22 174 lb (78.9 kg)  12/08/21 173 lb (78.5 kg)    Physical Exam Vitals and nursing note reviewed.  Constitutional:      General: She is not in acute distress.    Appearance: She is well-developed. She is  not diaphoretic.  Eyes:     Conjunctiva/sclera: Conjunctivae normal.  Cardiovascular:     Rate and Rhythm: Normal rate and regular rhythm.     Heart sounds: Normal heart sounds. No murmur heard. Pulmonary:     Effort: Pulmonary effort is normal. No respiratory distress.     Breath sounds: Normal breath sounds. No wheezing.  Musculoskeletal:        General: No tenderness. Normal range of motion.  Skin:    General: Skin is warm and dry.     Findings: No rash.  Neurological:     Mental Status: She is alert and oriented to person, place, and time.     Coordination: Coordination normal.  Psychiatric:        Behavior: Behavior normal.       Assessment & Plan:   Problem List Items Addressed This Visit       Other   Obesity (BMI 30.0-34.9) - Primary   Relevant Medications   phentermine 15 MG capsule    Continue phentermine, will do 1 more month and we will see how she does. Follow up plan: Return in about 4 weeks (around 03/04/2022), or if symptoms worsen or fail to improve, for Obesity.  Counseling provided for all of the vaccine components No orders of the defined types were placed in this encounter.   Caryl Pina, MD [facility] 02/04/2022, 1:29 PM

## 2022-02-11 ENCOUNTER — Other Ambulatory Visit: Payer: Self-pay | Admitting: Internal Medicine

## 2022-02-17 ENCOUNTER — Ambulatory Visit (INDEPENDENT_AMBULATORY_CARE_PROVIDER_SITE_OTHER): Payer: 59

## 2022-02-17 VITALS — Ht 63.0 in | Wt 170.0 lb

## 2022-02-17 DIAGNOSIS — Z0000 Encounter for general adult medical examination without abnormal findings: Secondary | ICD-10-CM | POA: Diagnosis not present

## 2022-02-17 NOTE — Progress Notes (Signed)
Subjective:   Brianna [name] is a 62 y.o. female who presents for Medicare Annual (Subsequent) preventive examination. I connected with  Brianna [name] on 02/17/22 by a audio enabled telemedicine application and verified that I am speaking with the correct person using two identifiers.  Patient Location: Home  Provider Location: Home Office  I discussed the limitations of evaluation and management by telemedicine. The patient expressed understanding and agreed to proceed.  Review of Systems     Cardiac Risk Factors include: advanced age (>42mn, >>73women);hypertension     Objective:    Today's Vitals   02/17/22 1318  Weight: 170 lb (77.1 kg)  Height: 5' 3"$  (1.6 m)   Body mass index is 30.11 kg/m.     02/17/2022    1:20 PM 02/03/2021    3:38 PM 02/12/2020   11:58 AM 01/04/2020    9:44 AM 12/20/2016   10:23 PM 01/15/2016    5:42 PM 06/29/2015   12:39 AM  Advanced Directives  Does Patient Have a Medical Advance Directive? No No No No No No No  Would patient like information on creating a medical advance directive? No - Patient declined No - Patient declined  No - Patient declined  No - Patient declined     Current Medications (verified) Outpatient Encounter Medications as of 02/17/2022  Medication Sig   aspirin-acetaminophen-caffeine (EXCEDRIN MIGRAINE) 250-250-65 MG tablet Take 2 tablets by mouth every 8 (eight) hours as needed for headache.   atorvastatin (LIPITOR) 20 MG tablet Take 1 tablet (20 mg total) by mouth daily.   citalopram (CELEXA) 40 MG tablet Take 1 tablet (40 mg total) by mouth daily.   dicyclomine (BENTYL) 10 MG capsule TAKE ONE CAPSULE BY MOUTH FOUR TIMES DAILY - BEFORE MEALS AND AT BEDTIME   famotidine (PEPCID) 40 MG tablet TAKE ONE TABLET BY MOUTH AT BEDTIME   fluticasone (FLONASE) 50 MCG/ACT nasal spray Place 1 spray into both nostrils 2 (two) times daily as needed for allergies or rhinitis.   furosemide (LASIX) 20 MG tablet Take 1 tablet (20 mg  total) by mouth daily.   lisinopril (ZESTRIL) 10 MG tablet Take 1 tablet (10 mg total) by mouth daily.   loratadine (ALLERGY RELIEF) 10 MG tablet TAKE ONE TABLET BY MOUTH DAILY   pantoprazole (PROTONIX) 40 MG tablet Take 1 tablet (40 mg total) by mouth 2 (two) times daily.   phentermine 15 MG capsule Take 1 capsule (15 mg total) by mouth every morning.   pregabalin (LYRICA) 50 MG capsule Take 1 capsule (50 mg total) by mouth 2 (two) times daily.   No facility-administered encounter medications on file as of 02/17/2022.    Allergies (verified) Tylenol with codeine #3 [acetaminophen-codeine]   History: Past Medical History:  Diagnosis Date   Anemia    Anxiety    Arthritis    Chronic back pain    Chronic neck pain    Chronic right hip pain since 09/2012   DDD (degenerative disc disease), cervical    Depression    Diverticulitis    Fibromyalgia    GERD (gastroesophageal reflux disease)    Headache    Hypertension    Pancreatitis    Past Surgical History:  Procedure Laterality Date   CESAREAN SECTION     CHOLECYSTECTOMY N/A 08/16/2014   Procedure: LAPAROSCOPIC CHOLECYSTECTOMY WITH CHOLANGIOGRAM;  Surgeon: MSherri Rad MD;  Location: [facility] ORS;  Service: General;  Laterality: N/A;   COLONOSCOPY N/A 08/22/2013   [id number] paper hemtochezia  from anorectal irritation   ESOPHAGOGASTRODUODENOSCOPY N/A 08/22/2013   [id number] reflux esophagitis/noncritical schatzki's ring and hiatial hernia   ESOPHAGOGASTRODUODENOSCOPY (EGD) WITH PROPOFOL N/A 01/04/2020     Surgeon: Daneil Dolin, MD; Erosive reflux esophagitis s/p dilation, medium-sized hiatal hernia, normal examined duodenum.   MALONEY DILATION N/A 01/04/2020   Procedure: Venia Minks DILATION;  Surgeon: Daneil Dolin, MD;  Location: [facility];  Service: Endoscopy;  Laterality: N/A;   OOPHORECTOMY     Family History  Problem Relation Age of Onset   Ovarian cancer Mother    Gallbladder disease Son    Colon cancer  Neg Hx    Social History   Socioeconomic History   Marital status: Widowed    Spouse name: Not on file   Number of children: 4   Years of education: Not on file   Highest education level: Not on file  Occupational History    Comment: hasn't worked since October 2014. Filing for disability  Tobacco Use   Smoking status: Former    Packs/day: 0.50    Years: 2.00    Total pack years: 1.00    Types: Cigarettes    Quit date: 01/06/2007    Years since quitting: 15.1   Smokeless tobacco: Never  Vaping Use   Vaping Use: Never used  Substance and Sexual Activity   Alcohol use: No   Drug use: No   Sexual activity: Not Currently  Other Topics Concern   Not on file  Social History Narrative   10 grandchildren.    Social Determinants of Health   Financial Resource Strain: Low Risk  (02/17/2022)   Overall Financial Resource Strain (CARDIA)    Difficulty of Paying Living Expenses: Not hard at all  Food Insecurity: No Food Insecurity (02/17/2022)   Hunger Vital Sign    Worried About Running Out of Food in the Last Year: Never true    Ran Out of Food in the Last Year: Never true  Transportation Needs: No Transportation Needs (02/17/2022)   PRAPARE - Hydrologist (Medical): No    Lack of Transportation (Non-Medical): No  Physical Activity: Sufficiently Active (02/17/2022)   Exercise Vital Sign    Days of Exercise per Week: 5 days    Minutes of Exercise per Session: 30 min  Stress: No Stress Concern Present (02/17/2022)   [place]    Feeling of Stress : Not at all  Social Connections: Moderately Isolated (02/17/2022)   Social Connection and Isolation Panel [NHANES]    Frequency of Communication with Friends and Family: More than three times a week    Frequency of Social Gatherings with Friends and Family: More than three times a week    Attends Religious Services: More than 4 times per year     Active Member of [employer] or Organizations: No    Attends Archivist Meetings: Never    Marital Status: Widowed    Tobacco Counseling Counseling given: Not Answered   Clinical Intake:  Pre-visit preparation completed: Yes  Pain : No/denies pain     Nutritional Risks: None Diabetes: No  How often do you need to have someone help you when you read instructions, pamphlets, or other written materials from your doctor or pharmacy?: 1 - Never  Diabetic?no   Interpreter Needed?: No  Information entered by :: Jadene Pierini, LPN   Activities of Daily Living    02/17/2022    1:20 PM  In your present state of health, do you have any difficulty performing the following activities:  Hearing? 0  Vision? 0  Difficulty concentrating or making decisions? 0  Walking or climbing stairs? 0  Dressing or bathing? 0  Doing errands, shopping? 0  Preparing Food and eating ? N  Using the Toilet? N  In the past six months, have you accidently leaked urine? N  Do you have problems with loss of bowel control? N  Managing your Medications? N  Managing your Finances? N  Housekeeping or managing your Housekeeping? N    Patient Care Team: Dettinger, Fransisca Kaufmann, MD as PCP - General (Family Medicine) Gala Romney Cristopher Estimable, MD as Consulting Physician (Gastroenterology)  Indicate any recent Medical Services you may have received from other than [facility] providers in the past year (date may be approximate).     Assessment:   This is a routine wellness examination for Brianna [name].  Hearing/Vision screen Vision Screening - Comments:: Wears rx glasses - up to date with routine eye exams with  Dr.Lee  Dietary issues and exercise activities discussed: Current Exercise Habits: Home exercise routine, Type of exercise: walking, Time (Minutes): 30, Frequency (Times/Week): 5, Weekly Exercise (Minutes/Week): 150, Exercise limited by: None identified   Goals Addressed             This Visit's Progress     Exercise 3x per week (30 min per time)   On track    Continue to exercise and try to lose weight       Depression Screen    02/17/2022    1:19 PM 02/04/2022   12:57 PM 01/07/2022    3:32 PM 12/08/2021    9:05 AM 11/10/2021    9:30 AM 10/09/2021    3:42 PM 09/05/2021    3:06 PM  PHQ 2/9 Scores  PHQ - 2 Score 0 2 2 1 2 2 1  $ PHQ- 9 Score 0 6 6 5 6 6 5    $ Fall Risk    02/17/2022    1:18 PM 02/04/2022   12:57 PM 01/07/2022    3:32 PM 12/08/2021    9:04 AM 11/10/2021    9:30 AM  Fall Risk   Falls in the past year? 0 1 1 1 1  $ Number falls in past yr: 0 0 0 0   Injury with Fall? 0 0 0 0 0  Risk for fall due to : No Fall Risks Impaired balance/gait Impaired balance/gait Impaired balance/gait   Follow up Falls prevention discussed Falls evaluation completed Falls evaluation completed Falls evaluation completed Falls evaluation completed    Green:  Any stairs in or around the home? Yes  If so, are there any without handrails? No  Home free of loose throw rugs in walkways, pet beds, electrical cords, etc? Yes  Adequate lighting in your home to reduce risk of falls? Yes   ASSISTIVE DEVICES UTILIZED TO PREVENT FALLS:  Life alert? No  Use of a cane, walker or w/c? No  Grab bars in the bathroom? No  Shower chair or bench in shower? No  Elevated toilet seat or a handicapped toilet? No          02/17/2022    1:20 PM 02/03/2021    3:42 PM  6CIT Screen  What Year? 0 points 0 points  What month? 0 points 0 points  What time? 0 points 0 points  Count back from 20 0 points 0 points  Months in  reverse 0 points 0 points  Repeat phrase 0 points 0 points  Total Score 0 points 0 points    Immunizations Immunization History  Administered Date(s) Administered   Tdap 08/02/2017    TDAP status: Up to date  Flu Vaccine status: Declined, Education has been provided regarding the importance of this vaccine but patient still declined. Advised may receive  this vaccine at local pharmacy or Health Dept. Aware to provide a copy of the vaccination record if obtained from local pharmacy or Health Dept. Verbalized acceptance and understanding.  Pneumococcal vaccine status: Due, Education has been provided regarding the importance of this vaccine. Advised may receive this vaccine at local pharmacy or Health Dept. Aware to provide a copy of the vaccination record if obtained from local pharmacy or Health Dept. Verbalized acceptance and understanding.  Covid-19 vaccine status: Declined, Education has been provided regarding the importance of this vaccine but patient still declined. Advised may receive this vaccine at local pharmacy or Health Dept.or vaccine clinic. Aware to provide a copy of the vaccination record if obtained from local pharmacy or Health Dept. Verbalized acceptance and understanding.  Qualifies for Shingles Vaccine? Yes   Zostavax completed No   Shingrix Completed?: No.    Education has been provided regarding the importance of this vaccine. Patient has been advised to call insurance company to determine out of pocket expense if they have not yet received this vaccine. Advised may also receive vaccine at local pharmacy or Health Dept. Verbalized acceptance and understanding.  Screening Tests Health Maintenance  Topic Date Due   INFLUENZA VACCINE  04/05/2022 (Originally 08/05/2021)   COVID-19 Vaccine (1) 07/27/2022 (Originally 12/06/1965)   Zoster Vaccines- Shingrix (1 of 2) 09/21/2022 (Originally 12/07/1979)   MAMMOGRAM  05/09/2022   PAP SMEAR-Modifier  01/19/2023   Medicare Annual Wellness (AWV)  02/18/2023   COLONOSCOPY (Pts 45-19yr Insurance coverage will need to be confirmed)  08/23/2023   DTaP/Tdap/Td (2 - Td or Tdap) 08/03/2027   Hepatitis C Screening  Completed   HIV Screening  Completed   HPV VACCINES  Aged Out    Health Maintenance  There are no preventive care reminders to display for this patient.   Colorectal cancer  screening: Type of screening: Colonoscopy. Completed 08/22/2013. Repeat every 10 years  Mammogram status: Completed 05/08/2021. Repeat every year  Bone Density status: Ordered not of age . Pt provided with contact info and advised to call to schedule appt.  Lung Cancer Screening: (Low Dose CT Chest recommended if Age 62-80years, 30 pack-year currently smoking OR have quit w/in 15years.) does not qualify.   Lung Cancer Screening Referral: n/a  Additional Screening:  Hepatitis C Screening: does not qualify;   Vision Screening: Recommended annual ophthalmology exams for early detection of glaucoma and other disorders of the eye. Is the patient up to date with their annual eye exam?  Yes  Who is the provider or what is the name of the office in which the patient attends annual eye exams? Dr.Lee  If pt is not established with a provider, would they like to be referred to a provider to establish care? No .   Dental Screening: Recommended annual dental exams for proper oral hygiene  Community Resource Referral / Chronic Care Management: CRR required this visit?  No   CCM required this visit?  No      Plan:     I have personally reviewed and noted the following in the patient's chart:   Medical and social history  Use of alcohol, tobacco or illicit drugs  Current medications and supplements including opioid prescriptions. Patient is not currently taking opioid prescriptions. Functional ability and status Nutritional status Physical activity Advanced directives List of other physicians Hospitalizations, surgeries, and ER visits in previous 12 months Vitals Screenings to include cognitive, depression, and falls Referrals and appointments  In addition, I have reviewed and discussed with patient certain preventive protocols, quality metrics, and best practice recommendations. A written personalized care plan for preventive services as well as general preventive health  recommendations were provided to patient.     Daphane Shepherd, LPN   [id number]   Nurse Notes: Due Pneumonia vaccine

## 2022-02-17 NOTE — Patient Instructions (Signed)
Brianna [name] , Thank you for taking time to come for your Medicare Wellness Visit. I appreciate your ongoing commitment to your health goals. Please review the following plan we discussed and let me know if I can assist you in the future.   These are the goals we discussed:  Goals      Exercise 3x per week (30 min per time)     Continue to exercise and try to lose weight        This is a list of the screening recommended for you and due dates:  Health Maintenance  Topic Date Due   Flu Shot  04/05/2022*   COVID-19 Vaccine (1) 07/27/2022*   Zoster (Shingles) Vaccine (1 of 2) 09/21/2022*   Mammogram  05/09/2022   Pap Smear  01/19/2023   Medicare Annual Wellness Visit  02/18/2023   Colon Cancer Screening  08/23/2023   DTaP/Tdap/Td vaccine (2 - Td or Tdap) 08/03/2027   Hepatitis C Screening: USPSTF Recommendation to screen - Ages 37-79 yo.  Completed   HIV Screening  Completed   HPV Vaccine  Aged Out  *Topic was postponed. The date shown is not the original due date.    Advanced directives: Advance directive discussed with you today. I have provided a copy for you to complete at home and have notarized. Once this is complete please bring a copy in to our office so we can scan it into your chart.   Conditions/risks identified: Aim for 30 minutes of exercise or brisk walking, 6-8 glasses of water, and 5 servings of fruits and vegetables each day.   Next appointment: Follow up in one year for your annual wellness visit.   Preventive Care 40-64 Years, Female Preventive care refers to lifestyle choices and visits with your health care provider that can promote health and wellness. What does preventive care include? A yearly physical exam. This is also called an annual well check. Dental exams once or twice a year. Routine eye exams. Ask your health care provider how often you should have your eyes checked. Personal lifestyle choices, including: Daily care of your teeth and  gums. Regular physical activity. Eating a healthy diet. Avoiding tobacco and drug use. Limiting alcohol use. Practicing safe sex. Taking low-dose aspirin daily starting at age 37. Taking vitamin and mineral supplements as recommended by your health care provider. What happens during an annual well check? The services and screenings done by your health care provider during your annual well check will depend on your age, overall health, lifestyle risk factors, and family history of disease. Counseling  Your health care provider may ask you questions about your: Alcohol use. Tobacco use. Drug use. Emotional well-being. Home and relationship well-being. Sexual activity. Eating habits. Work and work Statistician. Method of birth control. Menstrual cycle. Pregnancy history. Screening  You may have the following tests or measurements: Height, weight, and BMI. Blood pressure. Lipid and cholesterol levels. These may be checked every 5 years, or more frequently if you are over 18 years old. Skin check. Lung cancer screening. You may have this screening every year starting at age 67 if you have a 30-pack-year history of smoking and currently smoke or have quit within the past 15 years. Fecal occult blood test (FOBT) of the stool. You may have this test every year starting at age 34. Flexible sigmoidoscopy or colonoscopy. You may have a sigmoidoscopy every 5 years or a colonoscopy every 10 years starting at age 79. Hepatitis C blood test. Hepatitis B  blood test. Sexually transmitted disease (STD) testing. Diabetes screening. This is done by checking your blood sugar (glucose) after you have not eaten for a while (fasting). You may have this done every 1-3 years. Mammogram. This may be done every 1-2 years. Talk to your health care provider about when you should start having regular mammograms. This may depend on whether you have a family history of breast cancer. BRCA-related cancer  screening. This may be done if you have a family history of breast, ovarian, tubal, or peritoneal cancers. Pelvic exam and Pap test. This may be done every 3 years starting at age 39. Starting at age 41, this may be done every 5 years if you have a Pap test in combination with an HPV test. Bone density scan. This is done to screen for osteoporosis. You may have this scan if you are at high risk for osteoporosis. Discuss your test results, treatment options, and if necessary, the need for more tests with your health care provider. Vaccines  Your health care provider may recommend certain vaccines, such as: Influenza vaccine. This is recommended every year. Tetanus, diphtheria, and acellular pertussis (Tdap, Td) vaccine. You may need a Td booster every 10 years. Zoster vaccine. You may need this after age 43. Pneumococcal 13-valent conjugate (PCV13) vaccine. You may need this if you have certain conditions and were not previously vaccinated. Pneumococcal polysaccharide (PPSV23) vaccine. You may need one or two doses if you smoke cigarettes or if you have certain conditions. Talk to your health care provider about which screenings and vaccines you need and how often you need them. This information is not intended to replace advice given to you by your health care provider. Make sure you discuss any questions you have with your health care provider. Document Released: 01/18/2015 Document Revised: 09/11/2015 Document Reviewed: 10/23/2014 Elsevier Interactive Patient Education  2017 [place] Prevention in the Home Falls can cause injuries. They can happen to people of all ages. There are many things you can do to make your home safe and to help prevent falls. What can I do on the outside of my home? Regularly fix the edges of walkways and driveways and fix any cracks. Remove anything that might make you trip as you walk through a door, such as a raised step or threshold. Trim any  bushes or trees on the path to your home. Use bright outdoor lighting. Clear any walking paths of anything that might make someone trip, such as rocks or tools. Regularly check to see if handrails are loose or broken. Make sure that both sides of any steps have handrails. Any raised decks and porches should have guardrails on the edges. Have any leaves, snow, or ice cleared regularly. Use sand or salt on walking paths during winter. Clean up any spills in your garage right away. This includes oil or grease spills. What can I do in the bathroom? Use night lights. Install grab bars by the toilet and in the tub and shower. Do not use towel bars as grab bars. Use non-skid mats or decals in the tub or shower. If you need to sit down in the shower, use a plastic, non-slip stool. Keep the floor dry. Clean up any water that spills on the floor as soon as it happens. Remove soap buildup in the tub or shower regularly. Attach bath mats securely with double-sided non-slip rug tape. Do not have throw rugs and other things on the floor that can make  you trip. What can I do in the bedroom? Use night lights. Make sure that you have a light by your bed that is easy to reach. Do not use any sheets or blankets that are too big for your bed. They should not hang down onto the floor. Have a firm chair that has side arms. You can use this for support while you get dressed. Do not have throw rugs and other things on the floor that can make you trip. What can I do in the kitchen? Clean up any spills right away. Avoid walking on wet floors. Keep items that you use a lot in easy-to-reach places. If you need to reach something above you, use a strong step stool that has a grab bar. Keep electrical cords out of the way. Do not use floor polish or wax that makes floors slippery. If you must use wax, use non-skid floor wax. Do not have throw rugs and other things on the floor that can make you trip. What can I do  with my stairs? Do not leave any items on the stairs. Make sure that there are handrails on both sides of the stairs and use them. Fix handrails that are broken or loose. Make sure that handrails are as long as the stairways. Check any carpeting to make sure that it is firmly attached to the stairs. Fix any carpet that is loose or worn. Avoid having throw rugs at the top or bottom of the stairs. If you do have throw rugs, attach them to the floor with carpet tape. Make sure that you have a light switch at the top of the stairs and the bottom of the stairs. If you do not have them, ask someone to add them for you. What else can I do to help prevent falls? Wear shoes that: Do not have high heels. Have rubber bottoms. Are comfortable and fit you well. Are closed at the toe. Do not wear sandals. If you use a stepladder: Make sure that it is fully opened. Do not climb a closed stepladder. Make sure that both sides of the stepladder are locked into place. Ask someone to hold it for you, if possible. Clearly mark and make sure that you can see: Any grab bars or handrails. First and last steps. Where the edge of each step is. Use tools that help you move around (mobility aids) if they are needed. These include: Canes. Walkers. Scooters. Crutches. Turn on the lights when you go into a dark area. Replace any light bulbs as soon as they burn out. Set up your furniture so you have a clear path. Avoid moving your furniture around. If any of your floors are uneven, fix them. If there are any pets around you, be aware of where they are. Review your medicines with your doctor. Some medicines can make you feel dizzy. This can increase your chance of falling. Ask your doctor what other things that you can do to help prevent falls. This information is not intended to replace advice given to you by your health care provider. Make sure you discuss any questions you have with your health care  provider. Document Released: 10/18/2008 Document Revised: 05/30/2015 Document Reviewed: 01/26/2014 Elsevier Interactive Patient Education  2017 [employer].

## 2022-03-04 ENCOUNTER — Ambulatory Visit (INDEPENDENT_AMBULATORY_CARE_PROVIDER_SITE_OTHER): Payer: 59 | Admitting: Family Medicine

## 2022-03-04 ENCOUNTER — Encounter: Payer: Self-pay | Admitting: Family Medicine

## 2022-03-04 VITALS — BP 108/75 | HR 101 | Ht 63.0 in | Wt 173.0 lb

## 2022-03-04 DIAGNOSIS — Z6830 Body mass index (BMI) 30.0-30.9, adult: Secondary | ICD-10-CM | POA: Diagnosis not present

## 2022-03-04 DIAGNOSIS — E669 Obesity, unspecified: Secondary | ICD-10-CM

## 2022-03-04 NOTE — Progress Notes (Signed)
BP 108/75   Pulse (!) 101   Ht '5\' 3"'$  (1.6 m)   Wt 173 lb (78.5 kg)   LMP 04/14/2012   SpO2 98%   BMI 30.65 kg/m    Subjective:   Patient ID: Brianna [name], female    DOB: 1960/02/15, 62 y.o.   MRN: [id number]  HPI: Brianna [name] is a 62 y.o. female presenting on 03/04/2022 for Weight Check (170lb on 1/31)   HPI Patient is coming in today for obesity and weight recheck.  She has been on the phentermine 15 mg daily and says that she has not done as well with her appetite mainly she has been making friendship bread and eating more bit.  She is also been eating a lot of Valentine's Day candy she has gained 3 pounds over the past month and then she had been stuck around 170 there before that.  Relevant past medical, surgical, family and social history reviewed and updated as indicated. Interim medical history since our last visit reviewed. Allergies and medications reviewed and updated.  Review of Systems  Constitutional:  Negative for chills and fever.  Eyes:  Negative for visual disturbance.  Respiratory:  Negative for chest tightness and shortness of breath.   Cardiovascular:  Negative for chest pain and leg swelling.  Genitourinary:  Negative for difficulty urinating and dysuria.  Musculoskeletal:  Negative for back pain and gait problem.  Skin:  Negative for rash.  Neurological:  Negative for dizziness, light-headedness and headaches.  Psychiatric/Behavioral:  Negative for agitation, behavioral problems and sleep disturbance. The patient is not nervous/anxious.   All other systems reviewed and are negative.   Per HPI unless specifically indicated above   Allergies as of 03/04/2022       Reactions   Tylenol With Codeine #3 [acetaminophen-codeine] Other (See Comments)   headache        Medication List        Accurate as of March 04, 2022  3:09 PM. If you have any questions, ask your nurse or doctor.          Allergy Relief 10 MG tablet Generic  drug: loratadine TAKE ONE TABLET BY MOUTH DAILY   aspirin-acetaminophen-caffeine 250-250-65 MG tablet Commonly known as: EXCEDRIN MIGRAINE Take 2 tablets by mouth every 8 (eight) hours as needed for headache.   atorvastatin 20 MG tablet Commonly known as: LIPITOR Take 1 tablet (20 mg total) by mouth daily.   citalopram 40 MG tablet Commonly known as: CELEXA Take 1 tablet (40 mg total) by mouth daily.   dicyclomine 10 MG capsule Commonly known as: BENTYL TAKE ONE CAPSULE BY MOUTH FOUR TIMES DAILY - BEFORE MEALS AND AT BEDTIME   famotidine 40 MG tablet Commonly known as: PEPCID TAKE ONE TABLET BY MOUTH AT BEDTIME   fluticasone 50 MCG/ACT nasal spray Commonly known as: FLONASE Place 1 spray into both nostrils 2 (two) times daily as needed for allergies or rhinitis.   furosemide 20 MG tablet Commonly known as: LASIX Take 1 tablet (20 mg total) by mouth daily.   lisinopril 10 MG tablet Commonly known as: ZESTRIL Take 1 tablet (10 mg total) by mouth daily.   pantoprazole 40 MG tablet Commonly known as: PROTONIX Take 1 tablet (40 mg total) by mouth 2 (two) times daily.   phentermine 15 MG capsule Take 1 capsule (15 mg total) by mouth every morning.   pregabalin 50 MG capsule Commonly known as: Lyrica Take 1 capsule (50 mg total) by mouth  2 (two) times daily.         Objective:   BP 108/75   Pulse (!) 101   Ht '5\' 3"'$  (1.6 m)   Wt 173 lb (78.5 kg)   LMP 04/14/2012   SpO2 98%   BMI 30.65 kg/m   Wt Readings from Last 3 Encounters:  03/04/22 173 lb (78.5 kg)  02/17/22 170 lb (77.1 kg)  02/04/22 170 lb 6.4 oz (77.3 kg)    Physical Exam Vitals and nursing note reviewed.  Constitutional:      Appearance: Normal appearance.  Musculoskeletal:        General: No swelling.  Neurological:     Mental Status: She is alert.  Psychiatric:        Mood and Affect: Mood normal.        Behavior: Behavior normal.        Thought Content: Thought content normal.        Assessment & Plan:   Problem List Items Addressed This Visit       Other   Obesity (BMI 30.0-34.9) - Primary   Relevant Orders   Amb ref to Medical Nutrition Therapy-MNT    Will stop phentermine for now because of weight gain and the medications not working as well, will refer her to healthy weight wellness clinic. Follow up plan: Return in about 3 months (around 06/02/2022), or if symptoms worsen or fail to improve, for Obesity.  Counseling provided for all of the vaccine components Orders Placed This Encounter  Procedures   Amb ref to Col[place], MD [facility] 03/04/2022, 3:09 PM

## 2022-03-05 ENCOUNTER — Encounter: Payer: Self-pay | Admitting: Radiology

## 2022-04-22 ENCOUNTER — Encounter: Payer: Self-pay | Admitting: Family Medicine

## 2022-04-22 ENCOUNTER — Telehealth: Payer: Self-pay

## 2022-04-22 ENCOUNTER — Ambulatory Visit (INDEPENDENT_AMBULATORY_CARE_PROVIDER_SITE_OTHER): Payer: 59 | Admitting: Family Medicine

## 2022-04-22 VITALS — BP 101/69 | HR 115 | Ht 63.0 in | Wt 172.0 lb

## 2022-04-22 DIAGNOSIS — M654 Radial styloid tenosynovitis [de Quervain]: Secondary | ICD-10-CM | POA: Diagnosis not present

## 2022-04-22 MED ORDER — METHYLPREDNISOLONE ACETATE 40 MG/ML IJ SUSP
40.0000 mg | Freq: Once | INTRAMUSCULAR | Status: AC
  Administered 2022-04-22: 40 mg via INTRAMUSCULAR

## 2022-04-22 NOTE — Progress Notes (Signed)
BP 101/69   Pulse (!) 115   Ht  (1.6 m)   Wt 172 lb (78 kg)   LMP 04/14/2012   SpO2 96%   BMI 30.47 kg/m    Subjective:   Patient ID: Brianna [name], female    DOB: Dec 18, 1960, 62 y.o.   MRN: [id number]  HPI: Brianna [name] is a 62 y.o. female presenting on 04/22/2022 for Wrist Pain (L>R)   HPI Patient is coming in with left wrist pain, more than right, she says her left wrist on the lateral aspect has been inflamed and swollen and painful and she gets a catching and sometimes.  She is try to wear a brace in it.  She feels like it is just not getting better, it has been bothering her for quite a few weeks.  She does admit that she was a baby a lot with that arm and thinks that could be where it being bothered from.  She went to urgent care and was given a brace there as well and questions whether it is helping or not.  Relevant past medical, surgical, family and social history reviewed and updated as indicated. Interim medical history since our last visit reviewed. Allergies and medications reviewed and updated.  Review of Systems  Constitutional:  Negative for chills and fever.  Eyes:  Negative for visual disturbance.  Respiratory:  Negative for chest tightness and shortness of breath.   Cardiovascular:  Negative for chest pain.  Musculoskeletal:  Positive for arthralgias and myalgias. Negative for back pain and gait problem.  Skin:  Negative for rash.  Psychiatric/Behavioral:  Negative for agitation and behavioral problems.   All other systems reviewed and are negative.   Per HPI unless specifically indicated above   Allergies as of 04/22/2022       Reactions   Tylenol With Codeine #3 [acetaminophen-codeine] Other (See Comments)   headache        Medication List        Accurate as of April 22, 2022  4:40 PM. If you have any questions, ask your nurse or doctor.          Allergy Relief 10 MG tablet Generic drug: loratadine TAKE ONE TABLET BY  MOUTH DAILY   aspirin-acetaminophen-caffeine 250-250-65 MG tablet Commonly known as: EXCEDRIN MIGRAINE Take 2 tablets by mouth every 8 (eight) hours as needed for headache.   atorvastatin 20 MG tablet Commonly known as: LIPITOR Take 1 tablet (20 mg total) by mouth daily.   citalopram 40 MG tablet Commonly known as: CELEXA Take 1 tablet (40 mg total) by mouth daily.   dicyclomine 10 MG capsule Commonly known as: BENTYL TAKE ONE CAPSULE BY MOUTH FOUR TIMES DAILY - BEFORE MEALS AND AT BEDTIME   famotidine 40 MG tablet Commonly known as: PEPCID TAKE ONE TABLET BY MOUTH AT BEDTIME   fluticasone 50 MCG/ACT nasal spray Commonly known as: FLONASE Place 1 spray into both nostrils 2 (two) times daily as needed for allergies or rhinitis.   furosemide 20 MG tablet Commonly known as: LASIX Take 1 tablet (20 mg total) by mouth daily.   lisinopril 10 MG tablet Commonly known as: ZESTRIL Take 1 tablet (10 mg total) by mouth daily.   pantoprazole 40 MG tablet Commonly known as: PROTONIX Take 1 tablet (40 mg total) by mouth 2 (two) times daily.   phentermine 15 MG capsule Take 1 capsule (15 mg total) by mouth every morning.   pregabalin 50 MG capsule Commonly known  as: Lyrica Take 1 capsule (50 mg total) by mouth 2 (two) times daily.         Objective:   BP 101/69   Pulse (!) 115   Ht  (1.6 m)   Wt 172 lb (78 kg)   LMP 04/14/2012   SpO2 96%   BMI 30.47 kg/m   Wt Readings from Last 3 Encounters:  04/22/22 172 lb (78 kg)  03/04/22 173 lb (78.5 kg)  02/17/22 170 lb (77.1 kg)    Physical Exam Vitals and nursing note reviewed.  Constitutional:      Appearance: Normal appearance.  Musculoskeletal:     Left wrist: Swelling and tenderness (Tenderness and inflammation on the lateral aspect of the wrist proximal to the thumb.  More pain with medial flexion.) present. No deformity, bony tenderness, snuff box tenderness or crepitus.  Neurological:     Mental Status: She  is alert.     Tenosynovitis injection: Consent form signed. Risk factors of bleeding and infection discussed with patient and patient is agreeable towards injection. Patient prepped with Betadine. Lateral approach towards injection used. Injected 40 mg of Depo-Medrol and 1 mL of 2% lidocaine. Patient tolerated procedure well and no side effects from noted. Minimal to no bleeding. Simple bandage applied after.   Assessment & Plan:   Problem List Items Addressed This Visit   None Visit Diagnoses     De Quervain's tenosynovitis, left    -  Primary   Relevant Medications   methylPREDNISolone acetate (DEPO-MEDROL) injection 40 mg (Start on 04/22/2022  4:45 PM)       Will do injection recommended icing and then gave some behavioral modification and what to watch out so this complaint again. Follow up plan: Return if symptoms worsen or fail to improve.  Counseling provided for all of the vaccine components No orders of the defined types were placed in this encounter.   Arville Care, MD [facility] 04/22/2022, 4:40 PM

## 2022-04-22 NOTE — Telephone Encounter (Signed)
Pt had an appt with Dettinger today and states that she has not heard anything about an appt for the Nutrition referral.   Toni Amend, will you please check on referral. Thank you!

## 2022-04-23 NOTE — Telephone Encounter (Signed)
Pt has been given [facility] number and informed that they do have a a long wait list.

## 2022-04-29 ENCOUNTER — Telehealth: Payer: Self-pay

## 2022-04-29 ENCOUNTER — Other Ambulatory Visit: Payer: Self-pay

## 2022-04-29 MED ORDER — DICYCLOMINE HCL 10 MG PO CAPS: ORAL_CAPSULE | ORAL | 1 refills | Status: DC

## 2022-04-29 NOTE — Telephone Encounter (Signed)
Mandy: please schedule pt

## 2022-04-29 NOTE — Telephone Encounter (Signed)
Refill request for Dicyclomine 10 mg capsule Qty: 30 received from [facility]. Pt was last seen on 06/17/21

## 2022-05-19 ENCOUNTER — Other Ambulatory Visit: Payer: Self-pay | Admitting: Family Medicine

## 2022-05-26 ENCOUNTER — Encounter: Payer: Self-pay | Admitting: Internal Medicine

## 2022-05-26 ENCOUNTER — Ambulatory Visit (INDEPENDENT_AMBULATORY_CARE_PROVIDER_SITE_OTHER): Payer: 59 | Admitting: Internal Medicine

## 2022-05-26 VITALS — BP 84/52 | HR 87 | Temp 97.9°F | Ht 63.5 in | Wt 171.0 lb

## 2022-05-26 DIAGNOSIS — R194 Change in bowel habit: Secondary | ICD-10-CM

## 2022-05-26 DIAGNOSIS — K219 Gastro-esophageal reflux disease without esophagitis: Secondary | ICD-10-CM | POA: Diagnosis not present

## 2022-05-26 DIAGNOSIS — R1319 Other dysphagia: Secondary | ICD-10-CM

## 2022-05-26 MED ORDER — HYOSCYAMINE SULFATE 0.125 MG SL SUBL: 0.1250 mg | SUBLINGUAL_TABLET | Freq: Three times a day (TID) | SUBLINGUAL | 1 refills | Status: DC

## 2022-05-26 NOTE — Patient Instructions (Signed)
Was good to see you again today!  Continue your Metamucil fiber Gummies daily.  Stop Bentyl -  trial of Levsin 0.125 mg sublingual tablets 1 under the tongue before meals and at bedtime-up to 4 times a day.  Dispense 60  Because you have recurrent swallowing difficulties and ongoing bowel symptoms, we will proceed with an EGD with esophageal dilation as feasible/appropriate along with a screening colonoscopy.  We will set this up in the near future ASA 2.  Further recommendations to follow.

## 2022-05-26 NOTE — Progress Notes (Unsigned)
Primary Care Physician:  Dettinger, Elige Radon, MD Primary Gastroenterologist:  Dr. Jena Gauss  Pre-Procedure History & Physical: HPI:  Brianna [name] is a 62 y.o. female here for follow-up of GERD and intermittent constipation with abdominal cramps.  Patient notes increased difficulty with esophageal dysphagia to food.  Reflux symptoms fairly well-controlled on twice daily Protonix.  Did derive improvement with empiric passage of multiple Maloney dilator back in 2021.  Insidiously recurrent symptoms.  Uses Bentyl for abdominal cramps and intermittent constipation/alternating with diarrhea.  Wants to try something else.  Does take her Metamucil fiber Gummies.  It has been essentially 10 years since she last had a screening colonoscopy.  Past Medical History:  Diagnosis Date   Anemia    Anxiety    Arthritis    Chronic back pain    Chronic neck pain    Chronic right hip pain since 09/2012   DDD (degenerative disc disease), cervical    Depression    Diverticulitis    Fibromyalgia    GERD (gastroesophageal reflux disease)    Headache    Hypertension    Pancreatitis     Past Surgical History:  Procedure Laterality Date   CESAREAN SECTION     CHOLECYSTECTOMY N/A 08/16/2014   Procedure: LAPAROSCOPIC CHOLECYSTECTOMY WITH CHOLANGIOGRAM;  Surgeon: Natale Lay, MD;  Location: [facility] ORS;  Service: General;  Laterality: N/A;   COLONOSCOPY N/A 08/22/2013   [id number] diverticulosis paper hemtochezia from anorectal irritation   ESOPHAGOGASTRODUODENOSCOPY N/A 08/22/2013   [id number] reflux esophagitis/noncritical schatzki's ring and hiatial hernia   ESOPHAGOGASTRODUODENOSCOPY (EGD) WITH PROPOFOL N/A 01/04/2020     Surgeon: Corbin Ade, MD; Erosive reflux esophagitis s/p dilation, medium-sized hiatal hernia, normal examined duodenum.   MALONEY DILATION N/A 01/04/2020   Procedure: Elease Hashimoto DILATION;  Surgeon: Corbin Ade, MD;  Location: [facility];  Service: Endoscopy;  Laterality: N/A;    OOPHORECTOMY      Prior to Admission medications   Medication Sig Start Date End Date Taking? Authorizing Provider  aspirin-acetaminophen-caffeine (EXCEDRIN MIGRAINE) [phone] MG tablet Take 2 tablets by mouth every 8 (eight) hours as needed for headache.   Yes [provider]  atorvastatin (LIPITOR) 20 MG tablet Take 1 tablet (20 mg total) by mouth daily. 01/07/22  Yes Dettinger, Elige Radon, MD  citalopram (CELEXA) 40 MG tablet Take 1 tablet (40 mg total) by mouth daily. 01/07/22  Yes Dettinger, Elige Radon, MD  dicyclomine (BENTYL) 10 MG capsule TAKE ONE CAPSULE BY MOUTH FOUR TIMES DAILY - BEFORE MEALS AND AT BEDTIME 04/29/22  Yes Agatha Duplechain, Gerrit Friends, MD  famotidine (PEPCID) 40 MG tablet TAKE ONE TABLET BY MOUTH AT BEDTIME 02/11/22  Yes Caroline Matters, Gerrit Friends, MD  fluticasone [facility]) 50 MCG/ACT nasal spray Place 1 spray into both nostrils 2 (two) times daily as needed for allergies or rhinitis. 07/01/16  Yes Dettinger, Elige Radon, MD  furosemide (LASIX) 20 MG tablet Take 1 tablet (20 mg total) by mouth daily. 01/07/22  Yes Dettinger, Elige Radon, MD  lisinopril (ZESTRIL) 10 MG tablet Take 1 tablet (10 mg total) by mouth daily. 01/07/22  Yes Dettinger, Elige Radon, MD  loratadine (CLARITIN) 10 MG tablet TAKE ONE TABLET BY MOUTH DAILY 05/19/22  Yes Dettinger, Elige Radon, MD  pantoprazole (PROTONIX) 40 MG tablet Take 1 tablet (40 mg total) by mouth 2 (two) times daily. 07/03/21  Yes Dettinger, Elige Radon, MD    Allergies as of 05/26/2022 - Review Complete 05/26/2022  Allergen Reaction Noted   Tylenol with  codeine #3 [acetaminophen-codeine] Other (See Comments) 05/24/2014    Family History  Problem Relation Age of Onset   Ovarian cancer Mother    Gallbladder disease Son    Colon cancer Neg Hx     Social History   Socioeconomic History   Marital status: Widowed    Spouse name: Not on file   Number of children: 4   Years of education: Not on file   Highest education level: Not on file  Occupational History     Comment: hasn't worked since October 2014. Filing for disability  Tobacco Use   Smoking status: Former    Packs/day: 0.50    Years: 2.00    Additional pack years: 0.00    Total pack years: 1.00    Types: Cigarettes    Quit date: 01/06/2007    Years since quitting: 15.3   Smokeless tobacco: Never  Vaping Use   Vaping Use: Never used  Substance and Sexual Activity   Alcohol use: No   Drug use: No   Sexual activity: Not Currently  Other Topics Concern   Not on file  Social History Narrative   10 grandchildren.    Social Determinants of Health   Financial Resource Strain: Low Risk  (02/17/2022)   Overall Financial Resource Strain (CARDIA)    Difficulty of Paying Living Expenses: Not hard at all  Food Insecurity: No Food Insecurity (02/17/2022)   Hunger Vital Sign    Worried About Running Out of Food in the Last Year: Never true    Ran Out of Food in the Last Year: Never true  Transportation Needs: No Transportation Needs (02/17/2022)   PRAPARE - Administrator, Civil Service (Medical): No    Lack of Transportation (Non-Medical): No  Physical Activity: Sufficiently Active (02/17/2022)   Exercise Vital Sign    Days of Exercise per Week: 5 days    Minutes of Exercise per Session: 30 min  Stress: No Stress Concern Present (02/17/2022)   [employer] of Occupational Health - Occupational Stress Questionnaire    Feeling of Stress : Not at all  Social Connections: Moderately Isolated (02/17/2022)   Social Connection and Isolation Panel [NHANES]    Frequency of Communication with Friends and Family: More than three times a week    Frequency of Social Gatherings with Friends and Family: More than three times a week    Attends Religious Services: More than 4 times per year    Active Member of [employer] or Organizations: No    Attends Banker Meetings: Never    Marital Status: Widowed  Intimate Partner Violence: Not At Risk (02/17/2022)   Humiliation, Afraid,  Rape, and Kick questionnaire    Fear of Current or Ex-Partner: No    Emotionally Abused: No    Physically Abused: No    Sexually Abused: No    Review of Systems: See HPI, otherwise negative ROS  Physical Exam: BP (!) 84/52 (BP Location: Right Arm, Patient Position: Sitting, Cuff Size: Normal)   Pulse 87   Temp 97.9 F (36.6 C) (Oral)   Ht 5' 3.5" (1.613 m)   Wt 171 lb (77.6 kg)   LMP 04/14/2012   SpO2 97%   BMI 29.82 kg/m  General:   Alert,  Well-developed, well-nourished, pleasant and cooperative in NAD Lungs:  Clear throughout to auscultation.   No wheezes, crackles, or rhonchi. No acute distress. Heart:  Regular rate and rhythm; no murmurs, clicks, rubs,  or gallops. Abdomen: Non-distended, normal  bowel sounds.  Soft and nontender without appreciable mass or hepatosplenomegaly.  Pulses:  Normal pulses noted. Extremities:  Without clubbing or edema.  Impression/Plan: 62 year old lady with GERD fairly well-controlled on twice daily PPI therapy down with recurrent esophageal dysphagia.  Tubular esophagus appeared patent previously she did respond to empiric bougie dilation.  Intermittent abdominal cramps with altered bowel function.  Likely mixed IBS.  Recommendations  Continue Metamucil fiber Gummies daily.  Stop Bentyl -  trial of Levsin 0.125 mg sublingual tablets 1 under the tongue before meals and at bedtime-up to 4 times a day.  Dispense 60  Because ofrecurrent swallowing difficulties and ongoing bowel symptoms, we will proceed with an EGD with esophageal dilation as feasible/appropriate along with a screening colonoscopy.  We will set this up in the near future ASA 2.  Further recommendations to follow.                  Notice: This dictation was prepared with Dragon dictation along with smaller phrase technology. Any transcriptional errors that result from this process are unintentional and may not be corrected upon review.

## 2022-06-04 ENCOUNTER — Telehealth: Payer: Self-pay | Admitting: *Deleted

## 2022-06-04 ENCOUNTER — Encounter: Payer: Self-pay | Admitting: *Deleted

## 2022-06-04 ENCOUNTER — Other Ambulatory Visit: Payer: Self-pay | Admitting: *Deleted

## 2022-06-04 DIAGNOSIS — R1319 Other dysphagia: Secondary | ICD-10-CM

## 2022-06-04 DIAGNOSIS — Z1211 Encounter for screening for malignant neoplasm of colon: Secondary | ICD-10-CM

## 2022-06-04 MED ORDER — PEG 3350-KCL-NA BICARB-NACL 420 G PO SOLR: 4000.0000 mL | Freq: Once | ORAL | 0 refills | Status: AC

## 2022-06-04 NOTE — Telephone Encounter (Signed)
[employer] PA:  Notification or Prior Authorization is not required for the requested services You are not required to submit a notification/prior authorization based on the information provided. If you have general questions about the prior authorization requirements, visit [email or web address] > Clinician Resources > Advance and Admission Notification Requirements. The number above acknowledges your notification. Please write this reference number down for future reference. If you would like to request an organization determination, please call us at [phone]. Decision ID #: [id number]

## 2022-06-04 NOTE — Addendum Note (Signed)
Addended by: Elinor Dodge on: 06/04/2022 03:22 PM   Modules accepted: Orders

## 2022-06-05 ENCOUNTER — Other Ambulatory Visit: Payer: Self-pay | Admitting: *Deleted

## 2022-07-08 ENCOUNTER — Other Ambulatory Visit (HOSPITAL_COMMUNITY)
Admission: RE | Admit: 2022-07-08 | Discharge: 2022-07-08 | Disposition: A | Discharge status: Home / Self Care | Payer: 59 | Source: Ambulatory Visit | Attending: Internal Medicine | Admitting: Internal Medicine

## 2022-07-08 DIAGNOSIS — Z1211 Encounter for screening for malignant neoplasm of colon: Secondary | ICD-10-CM | POA: Diagnosis present

## 2022-07-08 DIAGNOSIS — R1319 Other dysphagia: Secondary | ICD-10-CM | POA: Insufficient documentation

## 2022-07-08 LAB — BASIC METABOLIC PANEL
Anion gap: 8 (ref 5–15)
BUN: 16 mg/dL (ref 8–23)
CO2: 24 mmol/L (ref 22–32)
Calcium: 9.1 mg/dL (ref 8.9–10.3)
Chloride: 107 mmol/L (ref 98–111)
Creatinine, Ser: 1.1 mg/dL — ABNORMAL HIGH (ref 0.44–1.00)
GFR, Estimated: 57 mL/min — ABNORMAL LOW (ref >60)
Glucose, Bld: 144 mg/dL — ABNORMAL HIGH (ref 70–99)
Potassium: 3.9 mmol/L (ref 3.5–5.1)
Sodium: 139 mmol/L (ref 135–145)

## 2022-07-15 ENCOUNTER — Other Ambulatory Visit: Payer: Self-pay | Admitting: Family Medicine

## 2022-07-15 ENCOUNTER — Telehealth: Payer: Self-pay | Admitting: *Deleted

## 2022-07-15 DIAGNOSIS — K2100 Gastro-esophageal reflux disease with esophagitis, without bleeding: Secondary | ICD-10-CM

## 2022-07-15 NOTE — Telephone Encounter (Signed)
Pt left vm stating that she had questions about her colonoscopy tomorrow.   Called pt and she says that she doesn't see when she is supposed to do the fleet enema. Advised pt that she will do the fleets enema 1 hour before going to hospital. Pt verbalized understanding.

## 2022-07-16 ENCOUNTER — Encounter (HOSPITAL_COMMUNITY): Payer: Self-pay | Admitting: Internal Medicine

## 2022-07-16 ENCOUNTER — Other Ambulatory Visit: Payer: Self-pay | Admitting: Family Medicine

## 2022-07-16 ENCOUNTER — Ambulatory Visit (HOSPITAL_COMMUNITY)
Admission: RE | Admit: 2022-07-16 | Discharge: 2022-07-16 | Disposition: A | Discharge status: Home / Self Care | Payer: 59 | Attending: Internal Medicine | Admitting: Internal Medicine

## 2022-07-16 ENCOUNTER — Ambulatory Visit (HOSPITAL_COMMUNITY): Payer: 59 | Admitting: Anesthesiology

## 2022-07-16 ENCOUNTER — Other Ambulatory Visit: Payer: Self-pay

## 2022-07-16 ENCOUNTER — Encounter (HOSPITAL_COMMUNITY)
Admission: RE | Disposition: A | Discharge status: Home / Self Care | Payer: Self-pay | Source: Home / Self Care | Attending: Internal Medicine

## 2022-07-16 ENCOUNTER — Ambulatory Visit (HOSPITAL_BASED_OUTPATIENT_CLINIC_OR_DEPARTMENT_OTHER): Payer: 59 | Admitting: Anesthesiology

## 2022-07-16 DIAGNOSIS — K5900 Constipation, unspecified: Secondary | ICD-10-CM | POA: Insufficient documentation

## 2022-07-16 DIAGNOSIS — K449 Diaphragmatic hernia without obstruction or gangrene: Secondary | ICD-10-CM | POA: Diagnosis not present

## 2022-07-16 DIAGNOSIS — M1990 Unspecified osteoarthritis, unspecified site: Secondary | ICD-10-CM | POA: Diagnosis not present

## 2022-07-16 DIAGNOSIS — F418 Other specified anxiety disorders: Secondary | ICD-10-CM | POA: Insufficient documentation

## 2022-07-16 DIAGNOSIS — I10 Essential (primary) hypertension: Secondary | ICD-10-CM | POA: Insufficient documentation

## 2022-07-16 DIAGNOSIS — Z87891 Personal history of nicotine dependence: Secondary | ICD-10-CM | POA: Insufficient documentation

## 2022-07-16 DIAGNOSIS — R1310 Dysphagia, unspecified: Secondary | ICD-10-CM

## 2022-07-16 DIAGNOSIS — G709 Myoneural disorder, unspecified: Secondary | ICD-10-CM | POA: Diagnosis not present

## 2022-07-16 DIAGNOSIS — K219 Gastro-esophageal reflux disease without esophagitis: Secondary | ICD-10-CM | POA: Insufficient documentation

## 2022-07-16 DIAGNOSIS — K2100 Gastro-esophageal reflux disease with esophagitis, without bleeding: Secondary | ICD-10-CM

## 2022-07-16 DIAGNOSIS — Z1211 Encounter for screening for malignant neoplasm of colon: Secondary | ICD-10-CM | POA: Insufficient documentation

## 2022-07-16 DIAGNOSIS — K5730 Diverticulosis of large intestine without perforation or abscess without bleeding: Secondary | ICD-10-CM | POA: Diagnosis not present

## 2022-07-16 DIAGNOSIS — M797 Fibromyalgia: Secondary | ICD-10-CM | POA: Diagnosis not present

## 2022-07-16 HISTORY — PX: COLONOSCOPY WITH PROPOFOL: SHX5780

## 2022-07-16 HISTORY — PX: MALONEY DILATION: SHX5535

## 2022-07-16 HISTORY — PX: ESOPHAGOGASTRODUODENOSCOPY (EGD) WITH PROPOFOL: SHX5813

## 2022-07-16 SURGERY — COLONOSCOPY WITH PROPOFOL
Anesthesia: General

## 2022-07-16 MED ORDER — LIDOCAINE HCL (CARDIAC) PF 100 MG/5ML IV SOSY
PREFILLED_SYRINGE | INTRAVENOUS | Status: DC | PRN
  Administered 2022-07-16: 80 mg via INTRAVENOUS

## 2022-07-16 MED ORDER — LACTATED RINGERS IV SOLN: INTRAVENOUS | Status: DC

## 2022-07-16 MED ORDER — PROPOFOL 10 MG/ML IV BOLUS
INTRAVENOUS | Status: DC | PRN
Start: 2022-07-16 — End: 2022-07-16
  Administered 2022-07-16 (×2): 50 mg via INTRAVENOUS
  Administered 2022-07-16: 140 mg via INTRAVENOUS

## 2022-07-16 MED ORDER — PHENYLEPHRINE HCL (PRESSORS) 10 MG/ML IV SOLN
INTRAVENOUS | Status: DC | PRN
  Administered 2022-07-16: 80 ug via INTRAVENOUS

## 2022-07-16 MED ORDER — STERILE WATER FOR IRRIGATION IR SOLN
Status: DC | PRN
  Administered 2022-07-16: 60 mL

## 2022-07-16 MED ORDER — PROPOFOL 500 MG/50ML IV EMUL
INTRAVENOUS | Status: DC | PRN
  Administered 2022-07-16: 125 ug/kg/min via INTRAVENOUS

## 2022-07-16 NOTE — Anesthesia Postprocedure Evaluation (Signed)
Anesthesia Post Note  Patient: TYNIYA KUYPER  Procedure(s) Performed: COLONOSCOPY WITH PROPOFOL ESOPHAGOGASTRODUODENOSCOPY (EGD) WITH PROPOFOL MALONEY DILATION  Patient location during evaluation: Phase II Anesthesia Type: General Level of consciousness: awake and alert and oriented Pain management: pain level controlled Vital Signs Assessment: post-procedure vital signs reviewed and stable Respiratory status: spontaneous breathing, nonlabored ventilation and respiratory function stable Cardiovascular status: blood pressure returned to baseline and stable Postop Assessment: no apparent nausea or vomiting Anesthetic complications: no  No notable events documented.   Last Vitals:  Vitals:   07/16/22 0701 07/16/22 0822  BP: 110/75 107/64  Pulse: 71 77  Resp: 10 15  Temp: 37 C 36.4 C  SpO2: 97% 95%    Last Pain:  Vitals:   07/16/22 0822  TempSrc: Oral  PainSc: 0-No pain                 Angelina Neece C Janellie Tennison

## 2022-07-16 NOTE — Anesthesia Procedure Notes (Signed)
Date/Time: 07/16/2022 7:34 AM  Performed by: Franco Nones, CRNAPre-anesthesia Checklist: Patient identified, Emergency Drugs available, Suction available, Timeout performed and Patient being monitored Patient Re-evaluated:Patient Re-evaluated prior to induction Oxygen Delivery Method: Nasal Cannula

## 2022-07-16 NOTE — Anesthesia Preprocedure Evaluation (Addendum)
Anesthesia Evaluation  Patient identified by MRN, date of birth, ID band Patient awake    Reviewed: Allergy & Precautions, H&P , NPO status , Patient's Chart, lab work & pertinent test results  Airway Mallampati: II  TM Distance: >3 FB Neck ROM: Full    Dental  (+) Dental Advisory Given, Chipped, Missing,    Pulmonary former smoker   Pulmonary exam normal breath sounds clear to auscultation       Cardiovascular hypertension, Pt. on medications Normal cardiovascular exam Rhythm:Regular Rate:Normal     Neuro/Psych  Headaches PSYCHIATRIC DISORDERS Anxiety Depression     Neuromuscular disease    GI/Hepatic Neg liver ROS,GERD  Medicated and Controlled,,  Endo/Other  negative endocrine ROS    Renal/GU negative Renal ROS  negative genitourinary   Musculoskeletal  (+) Arthritis , Osteoarthritis,  Fibromyalgia -  Abdominal   Peds negative pediatric ROS (+)  Hematology  (+) Blood dyscrasia, anemia   Anesthesia Other Findings Cervical radiculopathy  Reproductive/Obstetrics negative OB ROS                             Anesthesia Physical Anesthesia Plan  ASA: 2  Anesthesia Plan: General   Post-op Pain Management: Minimal or no pain anticipated   Induction: Intravenous  PONV Risk Score and Plan: 1  Airway Management Planned: Nasal Cannula and Natural Airway  Additional Equipment:   Intra-op Plan:   Post-operative Plan:   Informed Consent: I have reviewed the patients History and Physical, chart, labs and discussed the procedure including the risks, benefits and alternatives for the proposed anesthesia with the patient or authorized representative who has indicated his/her understanding and acceptance.     Dental advisory given  Plan Discussed with: CRNA and Surgeon  Anesthesia Plan Comments:        Anesthesia Quick Evaluation

## 2022-07-16 NOTE — Op Note (Addendum)
[facility] Patient Name: Brianna [name] Procedure Date: 07/16/2022 7:04 AM MRN: [id number] Date of Birth: Jul 04, 1960 Attending MD: Gennette Pac , MD, [id number] CSN: [id number] Age: 62 Admit Type: Outpatient Procedure:                Upper GI endoscopy Indications:              Dysphagia Providers:                Gennette Pac, MD, Nena Polio, RN, Zena Amos Referring MD:              Medicines:                Propofol per Anesthesia Complications:            No immediate complications. Estimated Blood Loss:     Estimated blood loss: none. Procedure:                Pre-Anesthesia Assessment:                           - Prior to the procedure, a History and Physical                            was performed, and patient medications and                            allergies were reviewed. The patient's tolerance of                            previous anesthesia was also reviewed. The risks                            and benefits of the procedure and the sedation                            options and risks were discussed with the patient.                            All questions were answered, and informed consent                            was obtained. Prior Anticoagulants: The patient has                            taken no anticoagulant or antiplatelet agents. ASA                            Grade Assessment: III - A patient with severe                            systemic disease. After reviewing the risks and  benefits, the patient was deemed in satisfactory                            condition to undergo the procedure.                           After obtaining informed consent, the endoscope was                            passed under direct vision. Throughout the                            procedure, the patient's blood pressure, pulse, and                            oxygen saturations were monitored  continuously. The                            [id number] ([id number]) scope was introduced through the                            mouth, and advanced to the second part of duodenum.                            The upper GI endoscopy was accomplished without                            difficulty. The patient tolerated the procedure                            well. Scope In: 7:44:10 AM Scope Out: 7:50:54 AM Total Procedure Duration: 0 hours 6 minutes 44 seconds  Findings:      The examined esophagus was normal. Gastric cavity empty.       Normal-appearing gastric mucosa. The scope was withdrawn. Dilation was       performed with a Maloney dilator with mild resistance at 54 Fr. The       scope was withdrawn. Dilation was performed with a Maloney dilator with       mild resistance at 56 Fr. The dilation site was examined and showed no       change.      A medium-sized hiatal hernia was present. Patent pylorus.      The duodenal bulb and second portion of the duodenum were normal. Impression:               - Normal esophagus. Dilated.                           - Medium-sized hiatal hernia.                           - Normal duodenal bulb and second portion of the                            duodenum.                           -  No specimens collected. Moderate Sedation:      Moderate (conscious) sedation was personally administered by an       anesthesia professional. The following parameters were monitored: oxygen       saturation, heart rate, blood pressure, respiratory rate, EKG, adequacy       of pulmonary ventilation, and response to care. Recommendation:           - Patient has a contact number available for                            emergencies. The signs and symptoms of potential                            delayed complications were discussed with the                            patient. Return to normal activities tomorrow.                            Written discharge instructions were  provided to the                            patient.                           - Advance diet as tolerated.                           - Continue present medications.                           - Return to my office in 3 months. See colonoscopy                            report Procedure Code(s):        --- Professional ---                           [phone], Esophagogastroduodenoscopy, flexible,                            transoral; diagnostic, including collection of                            specimen(s) by brushing or washing, when performed                            (separate procedure)                           43450, Dilation of esophagus, by unguided sound or                            bougie, single or multiple passes Diagnosis Code(s):        --- Professional ---  K44.9, Diaphragmatic hernia without obstruction or                            gangrene                           R13.10, Dysphagia, unspecified CPT copyright 2022 American Medical Association. All rights reserved. The codes documented in this report are preliminary and upon coder review may  be revised to meet current compliance requirements. Brianna Friends. Shakara Tweedy, MD Gennette Pac, MD 07/16/2022 8:15:16 AM This report has been signed electronically. Number of Addenda: 0

## 2022-07-16 NOTE — Op Note (Addendum)
[facility] Patient Name: Brianna [name] Procedure Date: 07/16/2022 7:13 AM MRN: [id number] Date of Birth: [birth year] Attending MD: Gennette Pac , MD, [id number] CSN: [id number] Age: 62 Admit Type: Outpatient Procedure:                Colonoscopy Indications:              Screening for colorectal malignant neoplasm Providers:                Gennette Pac, MD, Nena Polio, RN, Zena Amos Referring MD:              Medicines:                Propofol per Anesthesia Complications:            No immediate complications. Estimated Blood Loss:     Estimated blood loss: none. Procedure:                Pre-Anesthesia Assessment:                           - Prior to the procedure, a History and Physical                            was performed, and patient medications and                            allergies were reviewed. The patient's tolerance of                            previous anesthesia was also reviewed. The risks                            and benefits of the procedure and the sedation                            options and risks were discussed with the patient.                            All questions were answered, and informed consent                            was obtained. Prior Anticoagulants: The patient has                            taken no anticoagulant or antiplatelet agents. ASA                            Grade Assessment: III - A patient with severe                            systemic disease. After reviewing the risks and  benefits, the patient was deemed in satisfactory                            condition to undergo the procedure.                           After obtaining informed consent, the colonoscope                            was passed under direct vision. Throughout the                            procedure, the patient's blood pressure, pulse, and                             oxygen saturations were monitored continuously. The                            [id number]) scope was introduced through the                            anus and advanced to the the cecum, identified by                            appendiceal orifice and ileocecal valve. The                            colonoscopy was performed without difficulty. The                            patient tolerated the procedure well. The quality                            of the bowel preparation was adequate. The                            ileocecal valve, appendiceal orifice, and rectum                            were photographed. Scope In: 7:58:46 AM Scope Out: 8:11:57 AM Scope Withdrawal Time: 0 hours 7 minutes 18 seconds  Total Procedure Duration: 0 hours 13 minutes 11 seconds  Findings:      The perianal and digital rectal examinations were normal.      Scattered medium-mouthed diverticula were found in the entire colon.      The exam was otherwise without abnormality on direct and retroflexion       views. Impression:               - Diverticulosis in the entire examined colon.                           - The examination was otherwise normal on direct                            and  retroflexion views.                           - No specimens collected. Moderate Sedation:      Moderate (conscious) sedation was personally administered by an       anesthesia professional. The following parameters were monitored: oxygen       saturation, heart rate, blood pressure, and response to care. Recommendation:           - Patient has a contact number available for                            emergencies. The signs and symptoms of potential                            delayed complications were discussed with the                            patient. Return to normal activities tomorrow.                            Written discharge instructions were provided to the                            patient.                            - Advance diet as tolerated.                           - Continue present medications.                           - Repeat colonoscopy in 10 years for screening                            purposes.                           - Return to GI office in 3 months. See EGD report. Procedure Code(s):        --- Professional ---                           [phone], Colonoscopy, flexible; diagnostic, including                            collection of specimen(s) by brushing or washing,                            when performed (separate procedure) Diagnosis Code(s):        --- Professional ---                           Z12.11, Encounter for screening for malignant                            neoplasm of colon  K57.30, Diverticulosis of large intestine without                            perforation or abscess without bleeding CPT copyright 2022 American Medical Association. All rights reserved. The codes documented in this report are preliminary and upon coder review may  be revised to meet current compliance requirements. Gerrit Friends. Pearce Littlefield, MD Gennette Pac, MD 07/16/2022 8:18:41 AM This report has been signed electronically. Number of Addenda: 0

## 2022-07-16 NOTE — Discharge Instructions (Signed)
EGD Discharge instructions Please read the instructions outlined below and refer to this sheet in the next few weeks. These discharge instructions provide you with general information on caring for yourself after you leave the hospital. Your doctor may also give you specific instructions. While your treatment has been planned according to the most current medical practices available, unavoidable complications occasionally occur. If you have any problems or questions after discharge, please call your doctor. ACTIVITY You may resume your regular activity but move at a slower pace for the next 24 hours.  Take frequent rest periods for the next 24 hours.  Walking will help expel (get rid of) the air and reduce the bloated feeling in your abdomen.  No driving for 24 hours (because of the anesthesia (medicine) used during the test).  You may shower.  Do not sign any important legal documents or operate any machinery for 24 hours (because of the anesthesia used during the test).  NUTRITION Drink plenty of fluids.  You may resume your normal diet.  Begin with a light meal and progress to your normal diet.  Avoid alcoholic beverages for 24 hours or as instructed by your caregiver.  MEDICATIONS You may resume your normal medications unless your caregiver tells you otherwise.  WHAT YOU CAN EXPECT TODAY You may experience abdominal discomfort such as a feeling of fullness or "gas" pains.  FOLLOW-UP Your doctor will discuss the results of your test with you.  SEEK IMMEDIATE MEDICAL ATTENTION IF ANY OF THE FOLLOWING OCCUR: Excessive nausea (feeling sick to your stomach) and/or vomiting.  Severe abdominal pain and distention (swelling).  Trouble swallowing.  Temperature over 101 F (37.8 C).  Rectal bleeding or vomiting of blood.    Colonoscopy Discharge Instructions  Read the instructions outlined below and refer to this sheet in the next few weeks. These discharge instructions provide you with  general information on caring for yourself after you leave the hospital. Your doctor may also give you specific instructions. While your treatment has been planned according to the most current medical practices available, unavoidable complications occasionally occur. If you have any problems or questions after discharge, call Dr. Jena Gauss at [phone]. ACTIVITY You may resume your regular activity, but move at a slower pace for the next 24 hours.  Take frequent rest periods for the next 24 hours.  Walking will help get rid of the air and reduce the bloated feeling in your belly (abdomen).  No driving for 24 hours (because of the medicine (anesthesia) used during the test).   Do not sign any important legal documents or operate any machinery for 24 hours (because of the anesthesia used during the test).  NUTRITION Drink plenty of fluids.  You may resume your normal diet as instructed by your doctor.  Begin with a light meal and progress to your normal diet. Heavy or fried foods are harder to digest and may make you feel sick to your stomach (nauseated).  Avoid alcoholic beverages for 24 hours or as instructed.  MEDICATIONS You may resume your normal medications unless your doctor tells you otherwise.  WHAT YOU CAN EXPECT TODAY Some feelings of bloating in the abdomen.  Passage of more gas than usual.  Spotting of blood in your stool or on the toilet paper.  IF YOU HAD POLYPS REMOVED DURING THE COLONOSCOPY: No aspirin products for 7 days or as instructed.  No alcohol for 7 days or as instructed.  Eat a soft diet for the next 24 hours.  FINDING  OUT THE RESULTS OF YOUR TEST Not all test results are available during your visit. If your test results are not back during the visit, make an appointment with your caregiver to find out the results. Do not assume everything is normal if you have not heard from your caregiver or the medical facility. It is important for you to follow up on all of your test  results.  SEEK IMMEDIATE MEDICAL ATTENTION IF: You have more than a spotting of blood in your stool.  Your belly is swollen (abdominal distention).  You are nauseated or vomiting.  You have a temperature over 101.  You have abdominal pain or discomfort that is severe or gets worse throughout the day.       You have a hiatal hernia.  I stretched your esophagus today.    Diverticulosis only in your colon  Continue Metamucil every day.    Add MiraLAX 1 capful of powder in 8 ounces of water every night to facilitate bowel function   repeat colonoscopy in 10 years for screening.    Office visit with [place] in 3 months  At patient request, I called Paulette Blanch at [phone] -  rolled to voicemail-  voice mailbox is full.

## 2022-07-16 NOTE — Transfer of Care (Signed)
Immediate Anesthesia Transfer of Care Note  Patient: Brianna [name]  Procedure(s) Performed: COLONOSCOPY WITH PROPOFOL ESOPHAGOGASTRODUODENOSCOPY (EGD) WITH PROPOFOL MALONEY DILATION  Patient Location: Endoscopy Unit  Anesthesia Type:General  Level of Consciousness: awake and patient cooperative  Airway & Oxygen Therapy: Patient Spontanous Breathing  Post-op Assessment: Report given to RN and Post -op Vital signs reviewed and stable  Post vital signs: Reviewed and stable  Last Vitals:  Vitals Value Taken Time  BP 107/64 0821  Temp 97.6 0821  Pulse 74 0821  Resp 13 0821  SpO2 95 0821    Last Pain:  Vitals:   07/16/22 0701  TempSrc: Oral  PainSc: 0-No pain      Patients Stated Pain Goal: 8 (07/16/22 0701)  Complications: No notable events documented.

## 2022-07-16 NOTE — H&P (Signed)
@LOGO @   Primary Care Physician:  Dettinger, Elige Radon, MD Primary Gastroenterologist:  Dr. Jena Gauss  Pre-Procedure History & Physical: HPI:  Brianna [name] is a 62 y.o. female here for further evaluation of dysphagia via EGD some gurgly upper abdominal pain alternating constipation with normal bowel function.  Takes Metamucil daily.  Past Medical History:  Diagnosis Date   Anemia    Anxiety    Arthritis    Chronic back pain    Chronic neck pain    Chronic right hip pain since 09/2012   DDD (degenerative disc disease), cervical    Depression    Diverticulitis    Fibromyalgia    GERD (gastroesophageal reflux disease)    Headache    Hypertension    Pancreatitis     Past Surgical History:  Procedure Laterality Date   CESAREAN SECTION     CHOLECYSTECTOMY N/A 08/16/2014   Procedure: LAPAROSCOPIC CHOLECYSTECTOMY WITH CHOLANGIOGRAM;  Surgeon: Natale Lay, MD;  Location: [facility] ORS;  Service: General;  Laterality: N/A;   COLONOSCOPY N/A 08/22/2013   ZOX:[id number] diverticulosis paper hemtochezia from anorectal irritation   ESOPHAGOGASTRODUODENOSCOPY N/A 08/22/2013   UJW:JXBJYNW reflux esophagitis/noncritical schatzki's ring and hiatial hernia   ESOPHAGOGASTRODUODENOSCOPY (EGD) WITH PROPOFOL N/A 01/04/2020     Surgeon: Corbin Ade, MD; Erosive reflux esophagitis s/p dilation, medium-sized hiatal hernia, normal examined duodenum.   MALONEY DILATION N/A 01/04/2020   Procedure: Elease Hashimoto DILATION;  Surgeon: Corbin Ade, MD;  Location: [facility];  Service: Endoscopy;  Laterality: N/A;   OOPHORECTOMY      Prior to Admission medications   Medication Sig Start Date End Date Taking? Authorizing Provider  acetaminophen-codeine (TYLENOL #3) 300-30 MG tablet Take 1 tablet by mouth every 6 (six) hours as needed for severe pain. 07/02/22  Yes [provider]  amoxicillin (AMOXIL) 500 MG capsule Take 500 mg by mouth 4 (four) times daily. 07/02/22  Yes [provider]   atorvastatin (LIPITOR) 20 MG tablet Take 1 tablet (20 mg total) by mouth daily. 01/07/22  Yes Dettinger, Elige Radon, MD  citalopram (CELEXA) 40 MG tablet Take 1 tablet (40 mg total) by mouth daily. 01/07/22  Yes Dettinger, Elige Radon, MD  famotidine (PEPCID) 40 MG tablet TAKE ONE TABLET BY MOUTH AT BEDTIME Patient taking differently: Take 40 mg by mouth at bedtime as needed for heartburn. 02/11/22  Yes Ermie Glendenning, Gerrit Friends, MD  fluticasone [facility]) 50 MCG/ACT nasal spray Place 1 spray into both nostrils 2 (two) times daily as needed for allergies or rhinitis. 07/01/16  Yes Dettinger, Elige Radon, MD  hyoscyamine (LEVSIN SL) 0.125 MG SL tablet Place 1 tablet (0.125 mg total) under the tongue 4 (four) times daily -  before meals and at bedtime. 05/26/22  Yes Leiliana Foody, Gerrit Friends, MD  ibuprofen (ADVIL) 800 MG tablet Take 800 mg by mouth every 8 (eight) hours as needed for moderate pain. 07/02/22  Yes [provider]  lisinopril (ZESTRIL) 10 MG tablet Take 1 tablet (10 mg total) by mouth daily. 01/07/22  Yes Dettinger, Elige Radon, MD  loratadine (CLARITIN) 10 MG tablet TAKE ONE TABLET BY MOUTH DAILY 05/19/22  Yes Dettinger, Elige Radon, MD  pantoprazole (PROTONIX) 40 MG tablet Take 1 tablet (40 mg total) by mouth 2 (two) times daily. 07/03/21  Yes Dettinger, Elige Radon, MD  pregabalin (LYRICA) 50 MG capsule Take 50 mg by mouth 2 (two) times daily. 06/02/22  Yes [provider]  furosemide (LASIX) 20 MG tablet Take 1 tablet (20 mg total)  by mouth daily. Patient taking differently: Take 20 mg by mouth daily as needed for edema. 01/07/22   Dettinger, Elige Radon, MD    Allergies as of 06/04/2022 - Review Complete 05/26/2022  Allergen Reaction Noted   Tylenol with codeine #3 [acetaminophen-codeine] Other (See Comments) 05/24/2014    Family History  Problem Relation Age of Onset   Ovarian cancer Mother    Gallbladder disease Son    Colon cancer Neg Hx     Social History   Socioeconomic History   Marital status:  Widowed    Spouse name: Not on file   Number of children: 4   Years of education: Not on file   Highest education level: Not on file  Occupational History    Comment: hasn't worked since October 2014. Filing for disability  Tobacco Use   Smoking status: Former    Current packs/day: 0.00    Average packs/day: 0.5 packs/day for 2.0 years (1.0 ttl pk-yrs)    Types: Cigarettes    Start date: 01/05/2005    Quit date: 01/06/2007    Years since quitting: 15.5   Smokeless tobacco: Never  Vaping Use   Vaping status: Never Used  Substance and Sexual Activity   Alcohol use: No   Drug use: No   Sexual activity: Not Currently  Other Topics Concern   Not on file  Social History Narrative   10 grandchildren.    Social Determinants of Health   Financial Resource Strain: Low Risk  (02/17/2022)   Overall Financial Resource Strain (CARDIA)    Difficulty of Paying Living Expenses: Not hard at all  Food Insecurity: No Food Insecurity (02/17/2022)   Hunger Vital Sign    Worried About Running Out of Food in the Last Year: Never true    Ran Out of Food in the Last Year: Never true  Transportation Needs: No Transportation Needs (02/17/2022)   PRAPARE - Administrator, Civil Service (Medical): No    Lack of Transportation (Non-Medical): No  Physical Activity: Sufficiently Active (02/17/2022)   Exercise Vital Sign    Days of Exercise per Week: 5 days    Minutes of Exercise per Session: 30 min  Stress: No Stress Concern Present (02/17/2022)   [employer] of Occupational Health - Occupational Stress Questionnaire    Feeling of Stress : Not at all  Social Connections: Moderately Isolated (02/17/2022)   Social Connection and Isolation Panel [NHANES]    Frequency of Communication with Friends and Family: More than three times a week    Frequency of Social Gatherings with Friends and Family: More than three times a week    Attends Religious Services: More than 4 times per year    Active  Member of [employer] or Organizations: No    Attends Banker Meetings: Never    Marital Status: Widowed  Intimate Partner Violence: Not At Risk (02/17/2022)   Humiliation, Afraid, Rape, and Kick questionnaire    Fear of Current or Ex-Partner: No    Emotionally Abused: No    Physically Abused: No    Sexually Abused: No    Review of Systems: See HPI, otherwise negative ROS  Physical Exam: BP 110/75   Pulse 71   Temp 98.6 F (37 C) (Oral)   Resp 10   Ht 5\' 3"  (1.6 m)   Wt 77.1 kg   LMP 04/14/2012   SpO2 97%   BMI 30.11 kg/m  General:   Alert,  Well-developed, well-nourished, pleasant and cooperative  in NAD Neck:  Supple; no masses or thyromegaly. No significant cervical adenopathy. Lungs:  Clear throughout to auscultation.   No wheezes, crackles, or rhonchi. No acute distress. Heart:  Regular rate and rhythm; no murmurs, clicks, rubs,  or gallops. Abdomen: Non-distended, normal bowel sounds.  Soft and nontender without appreciable mass or hepatosplenomegaly.  Pulses:  Normal pulses noted. Extremities:  Without clubbing or edema.  Impression/Plan: 62 year old lady with recurrent esophageal dysphagia.  Some gurgling upper abdominal discomfort.  Constipation suboptimally managed with Metamucil only.  She is due for screening colonoscopy.  I will offer the patient EGD with esophageal dilation as feasible/appropriate today per plan risk benefits limitations have been reviewed.  Patient is agreeable.     Notice: This dictation was prepared with Dragon dictation along with smaller phrase technology. Any transcriptional errors that result from this process are unintentional and may not be corrected upon review.

## 2022-07-21 ENCOUNTER — Other Ambulatory Visit: Payer: Self-pay | Admitting: Internal Medicine

## 2022-07-22 ENCOUNTER — Encounter (HOSPITAL_COMMUNITY): Payer: Self-pay | Admitting: Internal Medicine

## 2022-07-29 ENCOUNTER — Telehealth: Payer: Self-pay | Admitting: Family Medicine

## 2022-07-29 NOTE — Telephone Encounter (Signed)
This cannot be done virtually. Pt will need to be seen in person since medication is a controlled substance and Dr. Louanne Skye is not the original prescriber.

## 2022-08-06 ENCOUNTER — Encounter (INDEPENDENT_AMBULATORY_CARE_PROVIDER_SITE_OTHER): Payer: Self-pay

## 2022-08-10 ENCOUNTER — Encounter: Payer: Self-pay | Admitting: Family Medicine

## 2022-08-10 ENCOUNTER — Ambulatory Visit (INDEPENDENT_AMBULATORY_CARE_PROVIDER_SITE_OTHER): Payer: 59 | Admitting: Family Medicine

## 2022-08-10 VITALS — BP 104/72 | HR 86 | Ht 63.0 in | Wt 173.0 lb

## 2022-08-10 DIAGNOSIS — E785 Hyperlipidemia, unspecified: Secondary | ICD-10-CM | POA: Diagnosis not present

## 2022-08-10 DIAGNOSIS — I10 Essential (primary) hypertension: Secondary | ICD-10-CM | POA: Diagnosis not present

## 2022-08-10 DIAGNOSIS — K2100 Gastro-esophageal reflux disease with esophagitis, without bleeding: Secondary | ICD-10-CM | POA: Diagnosis not present

## 2022-08-10 DIAGNOSIS — M797 Fibromyalgia: Secondary | ICD-10-CM

## 2022-08-10 DIAGNOSIS — F419 Anxiety disorder, unspecified: Secondary | ICD-10-CM | POA: Diagnosis not present

## 2022-08-10 DIAGNOSIS — R600 Localized edema: Secondary | ICD-10-CM

## 2022-08-10 DIAGNOSIS — F32A Depression, unspecified: Secondary | ICD-10-CM

## 2022-08-10 MED ORDER — CITALOPRAM HYDROBROMIDE 40 MG PO TABS
40.0000 mg | ORAL_TABLET | Freq: Every day | ORAL | 3 refills | Status: DC
Start: 2022-08-10 — End: 2023-08-11

## 2022-08-10 MED ORDER — PREGABALIN 50 MG PO CAPS: 50.0000 mg | ORAL_CAPSULE | Freq: Two times a day (BID) | ORAL | 5 refills | Status: DC

## 2022-08-10 MED ORDER — FUROSEMIDE 20 MG PO TABS
20.0000 mg | ORAL_TABLET | Freq: Every day | ORAL | 1 refills | Status: DC | PRN
Start: 2022-08-10 — End: 2023-02-22

## 2022-08-10 MED ORDER — PANTOPRAZOLE SODIUM 40 MG PO TBEC
40.0000 mg | DELAYED_RELEASE_TABLET | Freq: Two times a day (BID) | ORAL | 1 refills | Status: DC
Start: 2022-08-10 — End: 2023-02-22

## 2022-08-10 MED ORDER — ATORVASTATIN CALCIUM 20 MG PO TABS
20.0000 mg | ORAL_TABLET | Freq: Every day | ORAL | 3 refills | Status: DC
Start: 2022-08-10 — End: 2023-08-23

## 2022-08-10 MED ORDER — LISINOPRIL 10 MG PO TABS
10.0000 mg | ORAL_TABLET | Freq: Every day | ORAL | 3 refills | Status: DC
Start: 2022-08-10 — End: 2023-08-23

## 2022-08-10 MED ORDER — LORATADINE 10 MG PO TABS: 10.0000 mg | ORAL_TABLET | Freq: Every day | ORAL | 1 refills | Status: DC

## 2022-08-10 NOTE — Progress Notes (Signed)
BP 104/72   Pulse 86   Ht 5\' 3"  (1.6 m)   Wt 173 lb (78.5 kg)   LMP 04/14/2012   SpO2 97%   BMI 30.65 kg/m    Subjective:   Patient ID: Brianna [name], female    DOB: 03/01/1960, 62 y.o.   MRN: [id number]  HPI: Brianna [name] is a 62 y.o. female presenting on 08/10/2022 for Medical Management of Chronic Issues, Hypertension, Hyperlipidemia, and Depression   HPI Anxiety depression and fibromyalgia recheck Patient is coming in today for anxiety and depression recheck.  She currently has Lyrica although it looks like she is not taking it too frequently and citalopram. Current rx-Lyrica 50 mg twice daily # meds rx-60/month Effectiveness of current meds-works well, she is struggling well but now because she ran out of it because of change in pharmacy because her pharmacy went on a business Adverse reactions form meds-none Pill count performed-No Last drug screen -07/14/2021 ( high risk q40m, moderate risk q62m, low risk yearly ) Urine drug screen today- Yes Was the [facility] reviewed-yes  If yes were their any concerning findings? -None Controlled substance contract signed on: Today    08/10/2022   11:29 AM 04/22/2022    4:10 PM 03/04/2022    3:14 PM 02/17/2022    1:19 PM 02/04/2022   12:57 PM  Depression screen PHQ 2/9  Decreased Interest 1 1 1  0 1  Down, Depressed, Hopeless 1 1 1  0 1  PHQ - 2 Score 2 2 2  0 2  Altered sleeping 1 1 1  0 1  Tired, decreased energy 1 1 1  0 1  Change in appetite 1 0 0 0 1  Feeling bad or failure about yourself  0 0 0 0 0  Trouble concentrating 0 1 1 0 1  Moving slowly or fidgety/restless 0 0 0 0 0  Suicidal thoughts 0 0 0 0 0  PHQ-9 Score 5 5 5  0 6  Difficult doing work/chores Somewhat difficult Somewhat difficult Somewhat difficult Not difficult at all Somewhat difficult    Hypertension Patient is currently on furosemide and lisinopril, and their blood pressure today is 104/72. Patient denies any lightheadedness or dizziness. Patient denies  headaches, blurred vision, chest pains, shortness of breath, or weakness. Denies any side effects from medication and is content with current medication.   Hyperlipidemia Patient is coming in for recheck of his hyperlipidemia. The patient is currently taking atorvastatin. They deny any issues with myalgias or history of liver damage from it. They deny any focal numbness or weakness or chest pain.   GERD Patient is currently on famotidine and hyoscyamine and pantoprazole, sees gastroenterology.  She denies any major symptoms or abdominal pain or belching or burping. She denies any blood in her stool or lightheadedness or dizziness.  She also sees gastroenterology for this.  Relevant past medical, surgical, family and social history reviewed and updated as indicated. Interim medical history since our last visit reviewed. Allergies and medications reviewed and updated.  Review of Systems  Constitutional:  Negative for chills and fever.  Eyes:  Negative for visual disturbance.  Respiratory:  Negative for chest tightness and shortness of breath.   Cardiovascular:  Negative for chest pain and leg swelling.  Musculoskeletal:  Negative for back pain and gait problem.  Skin:  Negative for rash.  Neurological:  Negative for dizziness, light-headedness and headaches.  Psychiatric/Behavioral:  Negative for agitation and behavioral problems.   All other systems reviewed and are negative.  Per HPI unless specifically indicated above   Allergies as of 08/10/2022   No Known Allergies      Medication List        Accurate as of August 10, 2022 11:57 AM. If you have any questions, ask your nurse or doctor.          STOP taking these medications    amoxicillin 500 MG capsule Commonly known as: AMOXIL Stopped by: Elige Radon        TAKE these medications    acetaminophen-codeine 300-30 MG tablet Commonly known as: TYLENOL #3 Take 1 tablet by mouth every 6 (six) hours as needed  for severe pain.   atorvastatin 20 MG tablet Commonly known as: LIPITOR Take 1 tablet (20 mg total) by mouth daily.   citalopram 40 MG tablet Commonly known as: CELEXA Take 1 tablet (40 mg total) by mouth daily.   famotidine 40 MG tablet Commonly known as: PEPCID TAKE ONE TABLET BY MOUTH AT BEDTIME What changed:  when to take this reasons to take this   fluticasone 50 MCG/ACT nasal spray Commonly known as: FLONASE Place 1 spray into both nostrils 2 (two) times daily as needed for allergies or rhinitis.   furosemide 20 MG tablet Commonly known as: LASIX Take 1 tablet (20 mg total) by mouth daily as needed for edema.   hyoscyamine 0.125 MG SL tablet Commonly known as: LEVSIN SL DISSOLVE 1 TABLET(0.125 MG) UNDER THE TONGUE FOUR TIMES DAILY BEFORE MEALS AND AT BEDTIME   ibuprofen 800 MG tablet Commonly known as: ADVIL Take 800 mg by mouth every 8 (eight) hours as needed for moderate pain.   lisinopril 10 MG tablet Commonly known as: ZESTRIL Take 1 tablet (10 mg total) by mouth daily.   loratadine 10 MG tablet Commonly known as: CLARITIN Take 1 tablet (10 mg total) by mouth daily.   pantoprazole 40 MG tablet Commonly known as: PROTONIX Take 1 tablet (40 mg total) by mouth 2 (two) times daily.   pregabalin 50 MG capsule Commonly known as: LYRICA Take 1 capsule (50 mg total) by mouth 2 (two) times daily.         Objective:   BP 104/72   Pulse 86   Ht 5\' 3"  (1.6 m)   Wt 173 lb (78.5 kg)   LMP 04/14/2012   SpO2 97%   BMI 30.65 kg/m   Wt Readings from Last 3 Encounters:  08/10/22 173 lb (78.5 kg)  07/16/22 170 lb (77.1 kg)  05/26/22 171 lb (77.6 kg)    Physical Exam Vitals and nursing note reviewed.  Constitutional:      General: She is not in acute distress.    Appearance: She is well-developed. She is not diaphoretic.  Eyes:     Conjunctiva/sclera: Conjunctivae normal.  Cardiovascular:     Rate and Rhythm: Normal rate and regular rhythm.     Heart  sounds: Normal heart sounds. No murmur heard. Pulmonary:     Effort: Pulmonary effort is normal. No respiratory distress.     Breath sounds: Normal breath sounds. No wheezing.  Musculoskeletal:        General: No tenderness. Normal range of motion.  Skin:    General: Skin is warm and dry.     Findings: No rash.  Neurological:     Mental Status: She is alert and oriented to person, place, and time.     Coordination: Coordination normal.  Psychiatric:        Behavior: Behavior normal.  Assessment & Plan:   Problem List Items Addressed This Visit       Cardiovascular and Mediastinum   Essential hypertension, benign - Primary   Relevant Medications   lisinopril (ZESTRIL) 10 MG tablet   furosemide (LASIX) 20 MG tablet   atorvastatin (LIPITOR) 20 MG tablet   Other Relevant Orders   CMP14+EGFR     Digestive   GERD (gastroesophageal reflux disease)   Relevant Medications   pantoprazole (PROTONIX) 40 MG tablet   Other Relevant Orders   CBC with Differential/Platelet     Other   Anxiety and depression   Relevant Medications   citalopram (CELEXA) 40 MG tablet   Other Relevant Orders   ToxASSURE Select 13 (MW), Urine   Hyperlipidemia   Relevant Medications   lisinopril (ZESTRIL) 10 MG tablet   furosemide (LASIX) 20 MG tablet   atorvastatin (LIPITOR) 20 MG tablet   Other Relevant Orders   Lipid panel   Fibromyalgia   Relevant Medications   pregabalin (LYRICA) 50 MG capsule   citalopram (CELEXA) 40 MG tablet   Other Relevant Orders   ToxASSURE Select 13 (MW), Urine   Other Visit Diagnoses     Peripheral edema       Relevant Medications   furosemide (LASIX) 20 MG tablet       Renewed contract and urine drug screen for Lyrica.  Seems to be doing well.  No change in medication.  Will do blood work on the way out today.  Spent a good portion of the visit discussing chronic medical issues and management of those including hypertension and fibromyalgia and  anxiety and depression Follow up plan: Return in about 6 months (around 02/10/2023), or if symptoms worsen or fail to improve, for Fibromyalgia and anxiety depression and hypertension.  Counseling provided for all of the vaccine components Orders Placed This Encounter  Procedures   CBC with Differential/Platelet   CMP14+EGFR   Lipid panel   ToxASSURE Select 13 (MW), Urine    Arville Care, MD [facility] 08/10/2022, 11:57 AM

## 2022-08-16 ENCOUNTER — Other Ambulatory Visit: Payer: Self-pay | Admitting: Family Medicine

## 2022-08-16 DIAGNOSIS — K2100 Gastro-esophageal reflux disease with esophagitis, without bleeding: Secondary | ICD-10-CM

## 2022-09-23 ENCOUNTER — Ambulatory Visit: Payer: 59 | Admitting: Family Medicine

## 2022-10-13 ENCOUNTER — Ambulatory Visit (INDEPENDENT_AMBULATORY_CARE_PROVIDER_SITE_OTHER): Payer: 59 | Admitting: Nurse Practitioner

## 2022-10-13 ENCOUNTER — Encounter: Payer: Self-pay | Admitting: Nurse Practitioner

## 2022-10-13 VITALS — BP 106/66 | HR 80 | Temp 98.2°F | Ht 63.0 in | Wt 171.0 lb

## 2022-10-13 DIAGNOSIS — I10 Essential (primary) hypertension: Secondary | ICD-10-CM | POA: Diagnosis not present

## 2022-10-13 DIAGNOSIS — E669 Obesity, unspecified: Secondary | ICD-10-CM

## 2022-10-13 DIAGNOSIS — E785 Hyperlipidemia, unspecified: Secondary | ICD-10-CM | POA: Diagnosis not present

## 2022-10-13 DIAGNOSIS — Z6830 Body mass index (BMI) 30.0-30.9, adult: Secondary | ICD-10-CM

## 2022-10-13 DIAGNOSIS — K2100 Gastro-esophageal reflux disease with esophagitis, without bleeding: Secondary | ICD-10-CM | POA: Diagnosis not present

## 2022-10-13 NOTE — Progress Notes (Signed)
Office: [phone]  /  Fax: [phone]   Initial Visit  Brianna [name] was seen in clinic today to evaluate for obesity. She is interested in losing weight to improve overall health and reduce the risk of weight related complications. She presents today to review program treatment options, initial physical assessment, and evaluation.     She was referred by: PCP  When asked what else they would like to accomplish? She states: Improve energy levels and physical activity, Improve existing medical conditions, Improve quality of life, and Lose a target amount of weight : 30 lbs  Weight history:  She started gaining weigh in her 40s and her weight has fluctuated over the years.   When asked how has your weight affected you? She states: Contributed to medical problems, Having fatigue, and Having poor endurance  Some associated conditions: HTN, GERD, neck pain, back pain, hip pain, fibromyalgia, HLD,   Contributing factors: Family history, Nutritional, Medications, Stress, Reduced physical activity, Life event, and Menopause  Her son, wife and her grand baby lives with her.  He's struggling with drug abuse.  Her other son is in prison.    Weight promoting medications identified: Lyrica  Current nutrition plan: None  Current level of physical activity: Walking  Current or previous pharmacotherapy: Phentermine, OTC  Response to medication: did well and lost weight but stopped due side effects of tachycardia   Past medical history includes:   Past Medical History:  Diagnosis Date   Anemia    Anxiety    Arthritis    Chronic back pain    Chronic neck pain    Chronic right hip pain since 09/2012   DDD (degenerative disc disease), cervical    Depression    Diverticulitis    Fibromyalgia    GERD (gastroesophageal reflux disease)    Headache    Hypertension    Pancreatitis      Objective:   BP 106/66   Pulse 80   Temp 98.2 F (36.8 C)   Ht 5\' 3"  (1.6 m)   Wt 171 lb  (77.6 kg)   LMP 04/14/2012   SpO2 97%   BMI 30.29 kg/m  She was weighed on the bioimpedance scale: Body mass index is 30.29 kg/m.  Peak Weight:195 lbs , Body Fat%:41.3, Visceral Fat Rating:11, Weight trend over the last 12 months: Decreasing  General:  Alert, oriented and cooperative. Patient is in no acute distress.  Respiratory: Normal respiratory effort, no problems with respiration noted   Gait: able to ambulate independently  Mental Status: Normal mood and affect. Normal behavior. Normal judgment and thought content.   DIAGNOSTIC DATA REVIEWED:  BMET    Component Value Date/Time   NA 139 08/10/2022 1217   K 4.2 08/10/2022 1217   CL 103 08/10/2022 1217   CO2 22 08/10/2022 1217   GLUCOSE 84 08/10/2022 1217   GLUCOSE 144 (H) 07/08/2022 1030   BUN 17 08/10/2022 1217   CREATININE 1.06 (H) 08/10/2022 1217   CALCIUM 9.5 08/10/2022 1217   GFRNONAA 57 (L) 07/08/2022 1030   GFRAA 67 01/19/2020 0950   No results found for: "HGBA1C" No results found for: "INSULIN" CBC    Component Value Date/Time   WBC 7.7 08/10/2022 1217   WBC 8.4 12/19/2019 1636   RBC 4.56 08/10/2022 1217   RBC 4.72 12/19/2019 1636   HGB 13.0 08/10/2022 1217   HCT 38.4 08/10/2022 1217   PLT 344 08/10/2022 1217   MCV 84 08/10/2022 1217   MCH 28.5  08/10/2022 1217   MCH 29.0 12/19/2019 1636   MCHC 33.9 08/10/2022 1217   MCHC 34.0 12/19/2019 1636   RDW 13.0 08/10/2022 1217   Iron/TIBC/Ferritin/ %Sat No results found for: "IRON", "TIBC", "FERRITIN", "IRONPCTSAT" Lipid Panel     Component Value Date/Time   CHOL 148 08/10/2022 1217   TRIG 185 (H) 08/10/2022 1217   HDL 65 08/10/2022 1217   CHOLHDL 2.3 08/10/2022 1217   CHOLHDL 2.6 08/06/2014 0626   VLDL 14 08/06/2014 0626   LDLCALC 53 08/10/2022 1217   Hepatic Function Panel     Component Value Date/Time   PROT 6.5 08/10/2022 1217   ALBUMIN 4.3 08/10/2022 1217   AST 21 08/10/2022 1217   ALT 13 08/10/2022 1217   ALKPHOS 109 08/10/2022 1217    BILITOT 0.4 08/10/2022 1217   BILIDIR 0.1 06/17/2021 1319   IBILI 0.5 06/17/2021 1319      Component Value Date/Time   TSH 3.580 01/07/2022 1600     Assessment and Plan:   Essential hypertension, benign Continue to follow-up with PCP.  Continue medications as directed.  Hyperlipidemia, unspecified hyperlipidemia type Continue follow-up with PCP.  Continue medications as directed.  Gastroesophageal reflux disease with esophagitis without hemorrhage Continue follow-up with PCP.  Continue medications as directed.  Generalized obesity  BMI 30.0-30.9,adult        Obesity Treatment / Action Plan:  Patient will work on garnering support from family and friends to begin weight loss journey. Will work on eliminating or reducing the presence of highly palatable, calorie dense foods in the home. Will complete provided nutritional and psychosocial assessment questionnaire before the next appointment. Will be scheduled for indirect calorimetry to determine resting energy expenditure in a fasting state.  This will allow [place] to create a reduced calorie, high-protein meal plan to promote loss of fat mass while preserving muscle mass. Counseled on the health benefits of losing 5%-15% of total body weight. Was counseled on nutritional approaches to weight loss and benefits of reducing processed foods and consuming plant-based foods and high quality protein as part of nutritional weight management. Was counseled on pharmacotherapy and role as an adjunct in weight management.   Obesity Education Performed Today:  She was weighed on the bioimpedance scale and results were discussed and documented in the synopsis.  We discussed obesity as a disease and the importance of a more detailed evaluation of all the factors contributing to the disease.  We discussed the importance of long term lifestyle changes which include nutrition, exercise and behavioral modifications as well as the importance of  customizing this to her specific health and social needs.  We discussed the benefits of reaching a healthier weight to alleviate the symptoms of existing conditions and reduce the risks of the biomechanical, metabolic and psychological effects of obesity.  Brianna [name] appears to be in the action stage of change and states they are ready to start intensive lifestyle modifications and behavioral modifications.  30 minutes was spent today on this visit including the above counseling, pre-visit chart review, and post-visit documentation.  Reviewed by clinician on day of visit: allergies, medications, problem list, medical history, surgical history, family history, social history, and previous encounter notes pertinent to obesity diagnosis.    Theodis Sato [place] FNP-C

## 2022-10-13 NOTE — Progress Notes (Deleted)
Office: [phone]  /  Fax: [phone]   Initial Visit  Brianna [name] was seen in clinic today to evaluate for obesity. She is interested in losing weight to improve overall health and reduce the risk of weight related complications. She presents today to review program treatment options, initial physical assessment, and evaluation.     She was referred by: {emreferby:28303}  When asked what else they would like to accomplish? She states: {EMHopetoaccomplish:28304}  Weight history:  When asked how has your weight affected you? She states: {EMWeightAffected:28305}  Some associated conditions: {EMSomeConditions:28306}  Contributing factors: {EMcontributingfactors:28307}  Weight promoting medications identified: {EMWeightpromotingrx:28308}  Current nutrition plan: {EMNutritionplan:28309::"None"}  Current level of physical activity: {EMcurrentPA:28310::"None"}  Current or previous pharmacotherapy: {EM previousRx:28311}  Response to medication: {EMResponsetomedication:28312}   Past medical history includes:   Past Medical History:  Diagnosis Date   Anemia    Anxiety    Arthritis    Chronic back pain    Chronic neck pain    Chronic right hip pain since 09/2012   DDD (degenerative disc disease), cervical    Depression    Diverticulitis    Fibromyalgia    GERD (gastroesophageal reflux disease)    Headache    Hypertension    Pancreatitis      Objective:   BP 106/66   Pulse 80   Temp 98.2 F (36.8 C)   Ht 5\' 3"  (1.6 m)   Wt 171 lb (77.6 kg)   LMP 04/14/2012   SpO2 97%   BMI 30.29 kg/m  She was weighed on the bioimpedance scale: Body mass index is 30.29 kg/m.  Peak Weight:*** , Body Fat%:***, Visceral Fat Rating:***, Weight trend over the last 12 months: {emweighttrend:28333}  General:  Alert, oriented and cooperative. Patient is in no acute distress.  Respiratory: Normal respiratory effort, no problems with respiration noted   Gait: able to ambulate  independently  Mental Status: Normal mood and affect. Normal behavior. Normal judgment and thought content.   DIAGNOSTIC DATA REVIEWED:  BMET    Component Value Date/Time   NA 139 08/10/2022 1217   K 4.2 08/10/2022 1217   CL 103 08/10/2022 1217   CO2 22 08/10/2022 1217   GLUCOSE 84 08/10/2022 1217   GLUCOSE 144 (H) 07/08/2022 1030   BUN 17 08/10/2022 1217   CREATININE 1.06 (H) 08/10/2022 1217   CALCIUM 9.5 08/10/2022 1217   GFRNONAA 57 (L) 07/08/2022 1030   GFRAA 67 01/19/2020 0950   No results found for: "HGBA1C" No results found for: "INSULIN" CBC    Component Value Date/Time   WBC 7.7 08/10/2022 1217   WBC 8.4 12/19/2019 1636   RBC 4.56 08/10/2022 1217   RBC 4.72 12/19/2019 1636   HGB 13.0 08/10/2022 1217   HCT 38.4 08/10/2022 1217   PLT 344 08/10/2022 1217   MCV 84 08/10/2022 1217   MCH 28.5 08/10/2022 1217   MCH 29.0 12/19/2019 1636   MCHC 33.9 08/10/2022 1217   MCHC 34.0 12/19/2019 1636   RDW 13.0 08/10/2022 1217   Iron/TIBC/Ferritin/ %Sat No results found for: "IRON", "TIBC", "FERRITIN", "IRONPCTSAT" Lipid Panel     Component Value Date/Time   CHOL 148 08/10/2022 1217   TRIG 185 (H) 08/10/2022 1217   HDL 65 08/10/2022 1217   CHOLHDL 2.3 08/10/2022 1217   CHOLHDL 2.6 08/06/2014 0626   VLDL 14 08/06/2014 0626   LDLCALC 53 08/10/2022 1217   Hepatic Function Panel     Component Value Date/Time   PROT 6.5 08/10/2022 1217  ALBUMIN 4.3 08/10/2022 1217   AST 21 08/10/2022 1217   ALT 13 08/10/2022 1217   ALKPHOS 109 08/10/2022 1217   BILITOT 0.4 08/10/2022 1217   BILIDIR 0.1 06/17/2021 1319   IBILI 0.5 06/17/2021 1319      Component Value Date/Time   TSH 3.580 01/07/2022 1600     Assessment and Plan:   There are no diagnoses linked to this encounter.      Obesity Treatment / Action Plan:  {EMobesityactionplanscribe:28314::"Patient will work on garnering support from family and friends to begin weight loss journey.","Will work on eliminating  or reducing the presence of highly palatable, calorie dense foods in the home.","Will complete provided nutritional and psychosocial assessment questionnaire before the next appointment.","Will be scheduled for indirect calorimetry to determine resting energy expenditure in a fasting state.  This will allow [place] to create a reduced calorie, high-protein meal plan to promote loss of fat mass while preserving muscle mass.","Counseled on the health benefits of losing 5%-15% of total body weight.","Was counseled on nutritional approaches to weight loss and benefits of reducing processed foods and consuming plant-based foods and high quality protein as part of nutritional weight management.","Was counseled on pharmacotherapy and role as an adjunct in weight management. "}  Obesity Education Performed Today:  She was weighed on the bioimpedance scale and results were discussed and documented in the synopsis.  We discussed obesity as a disease and the importance of a more detailed evaluation of all the factors contributing to the disease.  We discussed the importance of long term lifestyle changes which include nutrition, exercise and behavioral modifications as well as the importance of customizing this to her specific health and social needs.  We discussed the benefits of reaching a healthier weight to alleviate the symptoms of existing conditions and reduce the risks of the biomechanical, metabolic and psychological effects of obesity.  Brianna [name] appears to be in the action stage of change and states they are ready to start intensive lifestyle modifications and behavioral modifications.  *** minutes was spent today on this visit including the above counseling, pre-visit chart review, and post-visit documentation.  Reviewed by clinician on day of visit: allergies, medications, problem list, medical history, surgical history, family history, social history, and previous encounter notes pertinent to  obesity diagnosis.    Theodis Sato [place] FNP-C

## 2023-01-08 ENCOUNTER — Encounter: Payer: Self-pay | Admitting: Internal Medicine

## 2023-02-10 ENCOUNTER — Ambulatory Visit: Payer: 59 | Admitting: Family Medicine

## 2023-02-17 ENCOUNTER — Other Ambulatory Visit: Payer: Self-pay | Admitting: Family Medicine

## 2023-02-22 ENCOUNTER — Encounter: Payer: Self-pay | Admitting: Family Medicine

## 2023-02-22 ENCOUNTER — Ambulatory Visit (INDEPENDENT_AMBULATORY_CARE_PROVIDER_SITE_OTHER): Payer: 59 | Admitting: Family Medicine

## 2023-02-22 VITALS — BP 131/85 | HR 67 | Ht 63.0 in | Wt 177.0 lb

## 2023-02-22 DIAGNOSIS — E785 Hyperlipidemia, unspecified: Secondary | ICD-10-CM

## 2023-02-22 DIAGNOSIS — I10 Essential (primary) hypertension: Secondary | ICD-10-CM

## 2023-02-22 DIAGNOSIS — K2100 Gastro-esophageal reflux disease with esophagitis, without bleeding: Secondary | ICD-10-CM

## 2023-02-22 DIAGNOSIS — F419 Anxiety disorder, unspecified: Secondary | ICD-10-CM

## 2023-02-22 DIAGNOSIS — R600 Localized edema: Secondary | ICD-10-CM | POA: Diagnosis not present

## 2023-02-22 DIAGNOSIS — F32A Depression, unspecified: Secondary | ICD-10-CM

## 2023-02-22 MED ORDER — FUROSEMIDE 20 MG PO TABS
20.0000 mg | ORAL_TABLET | Freq: Every day | ORAL | 3 refills | Status: AC | PRN
Start: 2023-02-22 — End: ?

## 2023-02-22 MED ORDER — PANTOPRAZOLE SODIUM 40 MG PO TBEC
40.0000 mg | DELAYED_RELEASE_TABLET | Freq: Two times a day (BID) | ORAL | 3 refills | Status: DC
Start: 2023-02-22 — End: 2023-08-23

## 2023-02-22 MED ORDER — PREGABALIN 50 MG PO CAPS: 50.0000 mg | ORAL_CAPSULE | Freq: Two times a day (BID) | ORAL | 5 refills | Status: DC

## 2023-02-22 NOTE — Progress Notes (Signed)
BP 131/85   Pulse 67   Ht 5\' 3"  (1.6 m)   Wt 177 lb (80.3 kg)   LMP 04/14/2012   SpO2 98%   BMI 31.35 kg/m    Subjective:   Patient ID: Brianna [name], female    DOB: 19-Nov-1960, 63 y.o.   MRN: [id number]  HPI: Brianna [name] is a 63 y.o. female presenting on 02/22/2023 for Medical Management of Chronic Issues, Hypertension, Anxiety, and Depression   HPI Hypertension Patient is currently on furosemide and lisinopril, and their blood pressure today is 131/85. Patient denies any lightheadedness or dizziness. Patient denies headaches, blurred vision, chest pains, shortness of breath, or weakness. Denies any side effects from medication and is content with current medication.   Hyperlipidemia Patient is coming in for recheck of his hyperlipidemia. The patient is currently taking atorvastatin. They deny any issues with myalgias or history of liver damage from it. They deny any focal numbness or weakness or chest pain.   GERD Patient is currently on pantoprazole and famotidine.  She denies any major symptoms or abdominal pain or belching or burping. She denies any blood in her stool or lightheadedness or dizziness.   Anxiety depression and fibromyalgia recheck Patient is coming in today for anxiety depression recheck and currently takes citalopram.  She also takes the pregabalin or Lyrica 50 mg 2 times daily can get 60/month and doing well on that.  She had a drug screen on 08/21/2022 contract on same day.    02/22/2023    1:42 PM 08/10/2022   11:29 AM 04/22/2022    4:10 PM 03/04/2022    3:14 PM 02/17/2022    1:19 PM  Depression screen PHQ 2/9  Decreased Interest 1 1 1 1  0  Down, Depressed, Hopeless 1 1 1 1  0  PHQ - 2 Score 2 2 2 2  0  Altered sleeping 1 1 1 1  0  Tired, decreased energy 1 1 1 1  0  Change in appetite 1 1 0 0 0  Feeling bad or failure about yourself  0 0 0 0 0  Trouble concentrating 1 0 1 1 0  Moving slowly or fidgety/restless 0 0 0 0 0  Suicidal thoughts 0 0 0  0 0  PHQ-9 Score 6 5 5 5  0  Difficult doing work/chores  Somewhat difficult Somewhat difficult Somewhat difficult Not difficult at all     Relevant past medical, surgical, family and social history reviewed and updated as indicated. Interim medical history since our last visit reviewed. Allergies and medications reviewed and updated.  Review of Systems  Constitutional:  Negative for chills and fever.  Eyes:  Negative for visual disturbance.  Respiratory:  Negative for chest tightness and shortness of breath.   Cardiovascular:  Negative for chest pain and leg swelling.  Genitourinary:  Negative for difficulty urinating and dysuria.  Musculoskeletal:  Negative for back pain and gait problem.  Skin:  Negative for rash.  Neurological:  Negative for dizziness, light-headedness and headaches.  Psychiatric/Behavioral:  Negative for agitation and behavioral problems.   All other systems reviewed and are negative.   Per HPI unless specifically indicated above   Allergies as of 02/22/2023   No Known Allergies      Medication List        Accurate as of February 22, 2023  2:00 PM. If you have any questions, ask your nurse or doctor.          STOP taking these medications  acetaminophen-codeine 300-30 MG tablet Commonly known as: TYLENOL #3 Stopped by: Elige Radon Nastasha Reising       TAKE these medications    atorvastatin 20 MG tablet Commonly known as: LIPITOR Take 1 tablet (20 mg total) by mouth daily.   citalopram 40 MG tablet Commonly known as: CELEXA Take 1 tablet (40 mg total) by mouth daily.   famotidine 40 MG tablet Commonly known as: PEPCID TAKE ONE TABLET BY MOUTH AT BEDTIME What changed:  when to take this reasons to take this   fluticasone 50 MCG/ACT nasal spray Commonly known as: FLONASE Place 1 spray into both nostrils 2 (two) times daily as needed for allergies or rhinitis.   furosemide 20 MG tablet Commonly known as: LASIX Take 1 tablet (20 mg  total) by mouth daily as needed for edema.   hyoscyamine 0.125 MG SL tablet Commonly known as: LEVSIN SL DISSOLVE 1 TABLET(0.125 MG) UNDER THE TONGUE FOUR TIMES DAILY BEFORE MEALS AND AT BEDTIME   ibuprofen 800 MG tablet Commonly known as: ADVIL Take 800 mg by mouth every 8 (eight) hours as needed for moderate pain.   lisinopril 10 MG tablet Commonly known as: ZESTRIL Take 1 tablet (10 mg total) by mouth daily.   loratadine 10 MG tablet Commonly known as: CLARITIN TAKE 1 TABLET BY MOUTH DAILY   pantoprazole 40 MG tablet Commonly known as: PROTONIX Take 1 tablet (40 mg total) by mouth 2 (two) times daily.   pregabalin 50 MG capsule Commonly known as: LYRICA Take 1 capsule (50 mg total) by mouth 2 (two) times daily.         Objective:   BP 131/85   Pulse 67   Ht 5\' 3"  (1.6 m)   Wt 177 lb (80.3 kg)   LMP 04/14/2012   SpO2 98%   BMI 31.35 kg/m   Wt Readings from Last 3 Encounters:  02/22/23 177 lb (80.3 kg)  10/13/22 171 lb (77.6 kg)  08/10/22 173 lb (78.5 kg)    Physical Exam Vitals and nursing note reviewed.  Constitutional:      General: She is not in acute distress.    Appearance: She is well-developed. She is not diaphoretic.  Eyes:     Conjunctiva/sclera: Conjunctivae normal.  Cardiovascular:     Rate and Rhythm: Normal rate and regular rhythm.     Heart sounds: Normal heart sounds. No murmur heard. Pulmonary:     Effort: Pulmonary effort is normal. No respiratory distress.     Breath sounds: Normal breath sounds. No wheezing.  Musculoskeletal:        General: No swelling or tenderness. Normal range of motion.  Skin:    General: Skin is warm and dry.     Findings: No rash.  Neurological:     Mental Status: She is alert and oriented to person, place, and time.     Coordination: Coordination normal.  Psychiatric:        Behavior: Behavior normal.       Assessment & Plan:   Problem List Items Addressed This Visit       Cardiovascular and  Mediastinum   Essential hypertension, benign - Primary   Relevant Medications   furosemide (LASIX) 20 MG tablet   Other Relevant Orders   CBC with Differential/Platelet   CMP14+EGFR     Digestive   GERD (gastroesophageal reflux disease)   Relevant Medications   pantoprazole (PROTONIX) 40 MG tablet   Other Relevant Orders   CBC with Differential/Platelet  Other   Anxiety and depression   Hyperlipidemia   Relevant Medications   furosemide (LASIX) 20 MG tablet   Other Relevant Orders   CMP14+EGFR   Lipid panel   Other Visit Diagnoses       Peripheral edema       Relevant Medications   furosemide (LASIX) 20 MG tablet       Continue current medicine, seems to be doing well.  Will check blood work today.  No changes Follow up plan: Return in about 6 months (around 08/22/2023), or if symptoms worsen or fail to improve, for Physical exam and Pap smear.  Counseling provided for all of the vaccine components Orders Placed This Encounter  Procedures   CBC with Differential/Platelet   CMP14+EGFR   Lipid panel    Arville Care, MD [facility] 02/22/2023, 2:00 PM

## 2023-02-23 LAB — CMP14+EGFR
ALT: 19 [IU]/L (ref 0–32)
AST: 24 [IU]/L (ref 0–40)
Albumin: 4.2 g/dL (ref 3.9–4.9)
Alkaline Phosphatase: 102 [IU]/L (ref 44–121)
BUN/Creatinine Ratio: 20 (ref 12–28)
BUN: 20 mg/dL (ref 8–27)
Bilirubin Total: 0.6 mg/dL (ref 0.0–1.2)
CO2: 22 mmol/L (ref 20–29)
Calcium: 9.4 mg/dL (ref 8.7–10.3)
Chloride: 107 mmol/L — ABNORMAL HIGH (ref 96–106)
Creatinine, Ser: 1.02 mg/dL — ABNORMAL HIGH (ref 0.57–1.00)
Globulin, Total: 2.2 g/dL (ref 1.5–4.5)
Glucose: 83 mg/dL (ref 70–99)
Potassium: 4 mmol/L (ref 3.5–5.2)
Sodium: 145 mmol/L — ABNORMAL HIGH (ref 134–144)
Total Protein: 6.4 g/dL (ref 6.0–8.5)
eGFR: 62 mL/min/{1.73_m2} (ref >59)

## 2023-02-23 LAB — CBC WITH DIFFERENTIAL/PLATELET
Basophils Absolute: 0.1 10*3/uL (ref 0.0–0.2)
Basos: 1 %
EOS (ABSOLUTE): 0.2 10*3/uL (ref 0.0–0.4)
Eos: 2 %
Hematocrit: 40.2 % (ref 34.0–46.6)
Hemoglobin: 13.3 g/dL (ref 11.1–15.9)
Immature Grans (Abs): 0 10*3/uL (ref 0.0–0.1)
Immature Granulocytes: 0 %
Lymphocytes Absolute: 3 10*3/uL (ref 0.7–3.1)
Lymphs: 37 %
MCH: 29.1 pg (ref 26.6–33.0)
MCHC: 33.1 g/dL (ref 31.5–35.7)
MCV: 88 fL (ref 79–97)
Monocytes Absolute: 0.7 10*3/uL (ref 0.1–0.9)
Monocytes: 8 %
Neutrophils Absolute: 4.1 10*3/uL (ref 1.4–7.0)
Neutrophils: 52 %
Platelets: 339 10*3/uL (ref 150–450)
RBC: 4.57 x10E6/uL (ref 3.77–5.28)
RDW: 12.4 % (ref 11.7–15.4)
WBC: 8 10*3/uL (ref 3.4–10.8)

## 2023-02-23 LAB — LIPID PANEL
Chol/HDL Ratio: 2.3 {ratio} (ref 0.0–4.4)
Cholesterol, Total: 160 mg/dL (ref 100–199)
HDL: 69 mg/dL (ref >39)
LDL Chol Calc (NIH): 72 mg/dL (ref 0–99)
Triglycerides: 104 mg/dL (ref 0–149)
VLDL Cholesterol Cal: 19 mg/dL (ref 5–40)

## 2023-03-02 ENCOUNTER — Ambulatory Visit (INDEPENDENT_AMBULATORY_CARE_PROVIDER_SITE_OTHER): Payer: 59 | Admitting: Internal Medicine

## 2023-03-02 ENCOUNTER — Encounter: Payer: Self-pay | Admitting: Internal Medicine

## 2023-03-02 VITALS — BP 136/83 | HR 87 | Temp 97.8°F | Ht 63.0 in | Wt 176.0 lb

## 2023-03-02 DIAGNOSIS — R1319 Other dysphagia: Secondary | ICD-10-CM

## 2023-03-02 DIAGNOSIS — K219 Gastro-esophageal reflux disease without esophagitis: Secondary | ICD-10-CM

## 2023-03-02 NOTE — Patient Instructions (Signed)
 Good to see you again today!  Continue pantoprazole 40 mg twice daily.  Best taken 30 minutes before meals  Continue to use Levsin sublingual before meals and at bedtime as needed  As far as peanut butter intolerance goes, might try the organic PB powder available at the grocery store.  I believe it contains less preservatives and less fat.  May agree with you.  Office visit with [place] in 6 months  Plan for average risk screening colonoscopy about 9 years from now.

## 2023-03-02 NOTE — Progress Notes (Unsigned)
 Primary Care Physician:  Dettinger, Elige Radon, MD Primary Gastroenterologist:  Dr. Jena Gauss  Pre-Procedure History & Physical: HPI:  Brianna [name] is a 63 y.o. female here for GERD dysphagia.  Normal-appearing esophagus last year dilated to 37 [place].  Dysphagia resolved reflux well-controlled occasional postprandial abdominal cramps rare episodes of diarrhea.  On Levsin sublingual as needed.  It has been a couple of months since she had any bouts of diarrhea or incontinence.  Negative colonoscopy (diverticulosis) last year.  Due for average risk screening exam in about 9 years.  States she is really doing well these days;   1 thing that bothers her she can no longer eat peanut butter  -  causes abdominal cramps she used to eat peanut butter all the time.  Mild ALT elevation previously-normal LFTs last month.  Past Medical History:  Diagnosis Date   Anemia    Anxiety    Arthritis    Chronic back pain    Chronic neck pain    Chronic right hip pain since 09/2012   DDD (degenerative disc disease), cervical    Depression    Diverticulitis    Fibromyalgia    GERD (gastroesophageal reflux disease)    Headache    Hypertension    Pancreatitis     Past Surgical History:  Procedure Laterality Date   CESAREAN SECTION     CHOLECYSTECTOMY N/A 08/16/2014   Procedure: LAPAROSCOPIC CHOLECYSTECTOMY WITH CHOLANGIOGRAM;  Surgeon: Natale Lay, MD;  Location: [facility];  Service: General;  Laterality: N/A;   COLONOSCOPY N/A 08/22/2013   [id number] diverticulosis paper hemtochezia from anorectal irritation   COLONOSCOPY WITH PROPOFOL N/A 07/16/2022   Procedure: COLONOSCOPY WITH PROPOFOL;  Surgeon: Corbin Ade, MD;  [facility];  Service: Endoscopy;  Laterality: N/A;  7:30 am, asa 2   ESOPHAGOGASTRODUODENOSCOPY N/A 08/22/2013   [id number] reflux esophagitis/noncritical schatzki's ring and hiatial hernia   ESOPHAGOGASTRODUODENOSCOPY (EGD) WITH PROPOFOL N/A 01/04/2020     Surgeon:  Corbin Ade, MD; Erosive reflux esophagitis s/p dilation, medium-sized hiatal hernia, normal examined duodenum.   ESOPHAGOGASTRODUODENOSCOPY (EGD) WITH PROPOFOL N/A 07/16/2022   Procedure: ESOPHAGOGASTRODUODENOSCOPY (EGD) WITH PROPOFOL;  Surgeon: Corbin Ade, MD;  Location: [facility];  Service: Endoscopy;  Laterality: N/A;   MALONEY DILATION N/A 01/04/2020   Procedure: Elease Hashimoto DILATION;  Surgeon: Corbin Ade, MD;  Location: [facility];  Service: Endoscopy;  Laterality: N/A;   MALONEY DILATION N/A 07/16/2022   Procedure: Elease Hashimoto DILATION;  Surgeon: Corbin Ade, MD;  Location: [facility];  Service: Endoscopy;  Laterality: N/A;   OOPHORECTOMY      Prior to Admission medications   Medication Sig Start Date End Date Taking? Authorizing Provider  atorvastatin (LIPITOR) 20 MG tablet Take 1 tablet (20 mg total) by mouth daily. 08/10/22  Yes Dettinger, Elige Radon, MD  citalopram (CELEXA) 40 MG tablet Take 1 tablet (40 mg total) by mouth daily. 08/10/22  Yes Dettinger, Elige Radon, MD  famotidine (PEPCID) 40 MG tablet TAKE ONE TABLET BY MOUTH AT BEDTIME Patient taking differently: Take 40 mg by mouth at bedtime as needed for heartburn. 02/11/22  Yes Leean Amezcua, Gerrit Friends, MD  fluticasone [facility]) 50 MCG/ACT nasal spray Place 1 spray into both nostrils 2 (two) times daily as needed for allergies or rhinitis. 07/01/16  Yes Dettinger, Elige Radon, MD  furosemide (LASIX) 20 MG tablet Take 1 tablet (20 mg total) by mouth daily as needed for edema. 02/22/23  Yes Dettinger, Elige Radon,  MD  hyoscyamine (LEVSIN SL) 0.125 MG SL tablet DISSOLVE 1 TABLET(0.125 MG) UNDER THE TONGUE FOUR TIMES DAILY BEFORE MEALS AND AT BEDTIME 07/21/22  Yes Amarii Amy, Gerrit Friends, MD  ibuprofen (ADVIL) 800 MG tablet Take 800 mg by mouth every 8 (eight) hours as needed for moderate pain. 07/02/22  Yes [provider]  lisinopril (ZESTRIL) 10 MG tablet Take 1 tablet (10 mg total) by mouth daily. 08/10/22  Yes Dettinger, Elige Radon, MD   loratadine (CLARITIN) 10 MG tablet TAKE 1 TABLET BY MOUTH DAILY 02/17/23  Yes Dettinger, Elige Radon, MD  pantoprazole (PROTONIX) 40 MG tablet Take 1 tablet (40 mg total) by mouth 2 (two) times daily. 02/22/23  Yes Dettinger, Elige Radon, MD  pregabalin (LYRICA) 50 MG capsule Take 1 capsule (50 mg total) by mouth 2 (two) times daily. 02/22/23  Yes Dettinger, Elige Radon, MD    Allergies as of 03/02/2023   (No Known Allergies)    Family History  Problem Relation Age of Onset   Ovarian cancer Mother    Gallbladder disease Son    Colon cancer Neg Hx     Social History   Socioeconomic History   Marital status: Widowed    Spouse name: Not on file   Number of children: 4   Years of education: Not on file   Highest education level: Associate degree: occupational, Scientist, product/process development, or vocational program  Occupational History    Comment: hasn't worked since October 2014. Filing for disability  Tobacco Use   Smoking status: Former    Current packs/day: 0.00    Average packs/day: 0.5 packs/day for 2.0 years (1.0 ttl pk-yrs)    Types: Cigarettes    Start date: 01/05/2005    Quit date: 01/06/2007    Years since quitting: 16.1   Smokeless tobacco: Never  Vaping Use   Vaping status: Never Used  Substance and Sexual Activity   Alcohol use: No   Drug use: No   Sexual activity: Not Currently  Other Topics Concern   Not on file  Social History Narrative   10 grandchildren.    Social Drivers of Corporate investment banker Strain: Low Risk  (02/19/2023)   Overall Financial Resource Strain (CARDIA)    Difficulty of Paying Living Expenses: Not hard at all  Food Insecurity: No Food Insecurity (02/19/2023)   Hunger Vital Sign    Worried About Running Out of Food in the Last Year: Never true    Ran Out of Food in the Last Year: Never true  Transportation Needs: No Transportation Needs (02/19/2023)   PRAPARE - Administrator, Civil Service (Medical): No    Lack of Transportation (Non-Medical): No   Physical Activity: Insufficiently Active (02/19/2023)   Exercise Vital Sign    Days of Exercise per Week: 3 days    Minutes of Exercise per Session: 30 min  Stress: Stress Concern Present (02/19/2023)   [employer] of Occupational Health - Occupational Stress Questionnaire    Feeling of Stress : Very much  Social Connections: Moderately Isolated (02/19/2023)   Social Connection and Isolation Panel [NHANES]    Frequency of Communication with Friends and Family: More than three times a week    Frequency of Social Gatherings with Friends and Family: More than three times a week    Attends Religious Services: 1 to 4 times per year    Active Member of [employer] or Organizations: No    Attends Banker Meetings: Not on file  Marital Status: Widowed  Intimate Partner Violence: Not At Risk (02/17/2022)   Humiliation, Afraid, Rape, and Kick questionnaire    Fear of Current or Ex-Partner: No    Emotionally Abused: No    Physically Abused: No    Sexually Abused: No    Review of Systems: See HPI, otherwise negative ROS  Physical Exam: BP 136/83 (BP Location: Left Arm, Patient Position: Sitting, Cuff Size: Normal)   Pulse 87   Temp 97.8 F (36.6 C) (Oral)   Ht 5\' 3"  (1.6 m)   Wt 176 lb (79.8 kg)   LMP 04/14/2012   SpO2 97%   BMI 31.18 kg/m  General:   Alert,  Well-developed, well-nourished, pleasant and cooperative in NAD Neck:  Supple; no masses or thyromegaly. No significant cervical adenopathy. Lungs:  Clear throughout to auscultation.   No wheezes, crackles, or rhonchi. No acute distress. Heart:  Regular rate and rhythm; no murmurs, clicks, rubs,  or gallops. Abdomen: Non-distended, normal bowel sounds.  Soft and nontender without appreciable mass or hepatosplenomegaly.   Impression/Plan:   -GERD well-controlled on twice daily PPI.  Dysphagia improved.  -Intermittent postprandial abdominal cramps and diarrhea rare bouts of incontinence.  Goes months in between  -  overall much improved with occasional Levsin.  -CRC screening.  Due for a surveillance colonoscopy 9 years  -OV 6 months     Notice: This dictation was prepared with Dragon dictation along with smaller phrase technology. Any transcriptional errors that result from this process are unintentional and may not be corrected upon review.

## 2023-03-04 ENCOUNTER — Encounter: Payer: Self-pay | Admitting: Family Medicine

## 2023-04-27 ENCOUNTER — Ambulatory Visit: Payer: Self-pay

## 2023-04-27 NOTE — Telephone Encounter (Signed)
  Chief Complaint: sinus pressure Symptoms: pressure, nasal congestion, cough Frequency: x 6 days Pertinent Negatives: Patient denies fever Disposition: [] ED /[] Urgent Care (no appt availability in office) / [x] Appointment(In office/virtual)/ []  [place] [facility]/ [] Home Care/ [] Refused Recommended Disposition /[] [place] [facility]/ []  Follow-up with PCP Additional Notes: states sinus pressure behind eyes and across face and now she is producing green muscus.  States this started on Thursday and has gotten worse. States pressure behind ears. States Advil  allergy and Claritin  and her migraine medication with no help.     Copied from CRM [id number]. Topic: Clinical - Red Word Triage >> Apr 27, 2023 12:33 PM [place] B wrote: [facility] that prompted transfer to Nurse Triage pt has sinus issues, green snot, severe headache and pressurre behind eyes Reason for Disposition  [1] Sinus congestion (pressure, fullness) AND [2] present > 10 days  Answer Assessment - Initial Assessment Questions 1. LOCATION: "Where does it hurt?"      face 2. ONSET: "When did the sinus pain start?"  (e.g., hours, days)      thursday 3. SEVERITY: "How bad is the pain?"   (Scale 1-10; mild, moderate or severe)   - MILD (1-3): doesn't interfere with normal activities    - MODERATE (4-7): interferes with normal activities (e.g., work or school) or awakens from sleep   - SEVERE (8-10): excruciating pain and patient unable to do any normal activities        8/10 headche 4. RECURRENT SYMPTOM: "Have you ever had sinus problems before?" If Yes, ask: "When was the last time?" and "What happened that time?"      yes 5. NASAL CONGESTION: "Is the nose blocked?" If Yes, ask: "Can you open it or must you breathe through your mouth?"     Can breathe 6. NASAL DISCHARGE: "Do you have discharge from your nose?" If so ask, "What color?"     green 7. FEVER: "Do you have a fever?" If Yes, ask: "What is it, how was it measured,  and when did it start?"      no 8. OTHER SYMPTOMS: "Do you have any other symptoms?" (e.g., sore throat, cough, earache, difficulty breathing)     cough  Protocols used: Sinus Pain or Congestion-A-AH

## 2023-04-28 ENCOUNTER — Ambulatory Visit (INDEPENDENT_AMBULATORY_CARE_PROVIDER_SITE_OTHER): Admitting: Family Medicine

## 2023-04-28 ENCOUNTER — Encounter: Payer: Self-pay | Admitting: Family Medicine

## 2023-04-28 VITALS — BP 128/85 | HR 68 | Ht 63.0 in | Wt 171.0 lb

## 2023-04-28 DIAGNOSIS — J0110 Acute frontal sinusitis, unspecified: Secondary | ICD-10-CM | POA: Diagnosis not present

## 2023-04-28 MED ORDER — FLUTICASONE PROPIONATE 50 MCG/ACT NA SUSP
1.0000 | Freq: Two times a day (BID) | NASAL | 6 refills | Status: AC | PRN
Start: 2023-04-28 — End: ?

## 2023-04-28 MED ORDER — AMOXICILLIN 500 MG PO CAPS: 500.0000 mg | ORAL_CAPSULE | Freq: Two times a day (BID) | ORAL | 0 refills | Status: DC

## 2023-04-28 NOTE — Progress Notes (Signed)
 BP 128/85   Pulse 68   Ht 5\' 3"  (1.6 m)   Wt 171 lb (77.6 kg)   LMP 04/14/2012   SpO2 97%   BMI 30.29 kg/m    Subjective:   Patient ID: Brianna [name], female    DOB: 26-Jun-1960, 63 y.o.   MRN: [id number]  HPI: Brianna [name] is a 63 y.o. female presenting on 04/28/2023 for URI (Started 6d ago)   HPI Sinus pressure and pain and headaches. Patient is coming in today because she has been having sinus pressure and pain.  She says it started out with allergies where she had sneezing and runny nose and nasal drainage and she has been taking her Claritin  or Zyrtec and was taking Flonase  till she ran out of it and she said is just not getting better and in fact her headaches and pressure in her head has continued to get worse.  She denies any fevers or chills or bodyaches.  She denies any sick contacts that she knows of.  She has been trying over-the-counter stuff as well such as Advil  Cold and Sinus and she just feels like it is not fully getting better and even worsening.  Relevant past medical, surgical, family and social history reviewed and updated as indicated. Interim medical history since our last visit reviewed. Allergies and medications reviewed and updated.  Review of Systems  Constitutional:  Negative for chills and fever.  HENT:  Positive for congestion, postnasal drip, rhinorrhea, sinus pressure and sneezing. Negative for ear discharge, ear pain and sore throat.   Eyes:  Negative for pain, redness and visual disturbance.  Respiratory:  Positive for cough. Negative for chest tightness and shortness of breath.   Cardiovascular:  Negative for chest pain and leg swelling.  Genitourinary:  Negative for difficulty urinating and dysuria.  Musculoskeletal:  Negative for back pain and gait problem.  Skin:  Negative for rash.  Neurological:  Negative for light-headedness and headaches.  Psychiatric/Behavioral:  Negative for agitation and behavioral problems.   All other  systems reviewed and are negative.   Per HPI unless specifically indicated above   Allergies as of 04/28/2023   No Known Allergies      Medication List        Accurate as of April 28, 2023  9:50 AM. If you have any questions, ask your nurse or doctor.          amoxicillin  500 MG capsule Commonly known as: AMOXIL  Take 1 capsule (500 mg total) by mouth 2 (two) times daily. Started by: Lucio Sabin Eulalah Rupert   atorvastatin  20 MG tablet Commonly known as: LIPITOR Take 1 tablet (20 mg total) by mouth daily.   citalopram  40 MG tablet Commonly known as: CELEXA  Take 1 tablet (40 mg total) by mouth daily.   famotidine  40 MG tablet Commonly known as: PEPCID  TAKE ONE TABLET BY MOUTH AT BEDTIME What changed:  when to take this reasons to take this   fluticasone  50 MCG/ACT nasal spray Commonly known as: FLONASE  Place 1 spray into both nostrils 2 (two) times daily as needed for allergies or rhinitis.   furosemide  20 MG tablet Commonly known as: LASIX  Take 1 tablet (20 mg total) by mouth daily as needed for edema.   hyoscyamine  0.125 MG SL tablet Commonly known as: LEVSIN SL DISSOLVE 1 TABLET(0.125 MG) UNDER THE TONGUE FOUR TIMES DAILY BEFORE MEALS AND AT BEDTIME   ibuprofen  800 MG tablet Commonly known as: ADVIL  Take 800 mg by mouth  every 8 (eight) hours as needed for moderate pain.   lisinopril  10 MG tablet Commonly known as: ZESTRIL  Take 1 tablet (10 mg total) by mouth daily.   loratadine  10 MG tablet Commonly known as: CLARITIN  TAKE 1 TABLET BY MOUTH DAILY   pantoprazole  40 MG tablet Commonly known as: PROTONIX  Take 1 tablet (40 mg total) by mouth 2 (two) times daily.   pregabalin  50 MG capsule Commonly known as: LYRICA  Take 1 capsule (50 mg total) by mouth 2 (two) times daily.         Objective:   BP 128/85   Pulse 68   Ht 5\' 3"  (1.6 m)   Wt 171 lb (77.6 kg)   LMP 04/14/2012   SpO2 97%   BMI 30.29 kg/m   Wt Readings from Last 3 Encounters:   04/28/23 171 lb (77.6 kg)  03/02/23 176 lb (79.8 kg)  02/22/23 177 lb (80.3 kg)    Physical Exam Vitals reviewed.  Constitutional:      General: She is not in acute distress.    Appearance: She is well-developed. She is not diaphoretic.  HENT:     Right Ear: Tympanic membrane, ear canal and external ear normal.     Left Ear: Tympanic membrane, ear canal and external ear normal.     Nose: Mucosal edema present. No rhinorrhea.     Right Sinus: Maxillary sinus tenderness and frontal sinus tenderness present.     Left Sinus: Maxillary sinus tenderness and frontal sinus tenderness present.     Mouth/Throat:     Pharynx: Uvula midline. No oropharyngeal exudate or posterior oropharyngeal erythema.     Tonsils: No tonsillar abscesses.  Eyes:     Conjunctiva/sclera: Conjunctivae normal.  Cardiovascular:     Rate and Rhythm: Normal rate and regular rhythm.     Heart sounds: Normal heart sounds. No murmur heard. Pulmonary:     Effort: Pulmonary effort is normal. No respiratory distress.     Breath sounds: Normal breath sounds. No wheezing or rhonchi.  Musculoskeletal:        General: No tenderness. Normal range of motion.  Skin:    General: Skin is warm and dry.     Findings: No rash.  Neurological:     Mental Status: She is alert and oriented to person, place, and time.     Coordination: Coordination normal.  Psychiatric:        Behavior: Behavior normal.       Assessment & Plan:   Problem List Items Addressed This Visit   None Visit Diagnoses       Acute non-recurrent frontal sinusitis    -  Primary   Relevant Medications   amoxicillin  (AMOXIL ) 500 MG capsule   fluticasone  (FLONASE ) 50 MCG/ACT nasal spray       Will treat like sinus infection, as she has been fighting this for over a week.  Will give amoxicillin  and refilled her Flonase  and also recommend that she could add Benadryl  at night on top of her current allergy medicines. Follow up plan: Return if symptoms  worsen or fail to improve.  Counseling provided for all of the vaccine components No orders of the defined types were placed in this encounter.   Jolyne Needs, MD [facility] 04/28/2023, 9:50 AM

## 2023-05-10 ENCOUNTER — Encounter: Payer: Self-pay | Admitting: Family Medicine

## 2023-05-10 NOTE — Telephone Encounter (Signed)
 Copied from CRM [id number]. Topic: General - Other >> May 10, 2023  3:58 PM Tiffany H wrote: Reason for CRM: Patient called to advise that she wanted to order an Elvis cup. She'll pick it up tomorrow. Advised that this is non-clinical. Please follow up with patient for clarity.

## 2023-05-11 ENCOUNTER — Encounter: Payer: Self-pay | Admitting: Family Medicine

## 2023-05-13 NOTE — Telephone Encounter (Signed)
 Copied from CRM [id number]. Topic: Clinical - Medical Advice >> May 13, 2023 12:53 PM Zipporah Him wrote: Reason for CRM: Patient would like a callback from [facility] in xray, she states its personal and did not want to provide the reason. Please call patient back per her request if/when available.

## 2023-06-07 ENCOUNTER — Telehealth: Payer: Self-pay

## 2023-06-07 NOTE — Telephone Encounter (Signed)
 Copied from CRM [id number]. Topic: General - Other >> Jun 07, 2023  8:54 AM Shelby Dessert H wrote: Reason for CRM: Patient is calling and is wanting to speak to the nurse for [employer]vorst 05/16/2015, she was left a message on Friday, patients callback number is [phone].

## 2023-06-07 NOTE — Telephone Encounter (Signed)
 See other patient's chart for updates. Will not document about other patient's in a different chart.

## 2023-07-30 ENCOUNTER — Encounter: Payer: Self-pay | Admitting: Internal Medicine

## 2023-08-06 ENCOUNTER — Telehealth: Payer: Self-pay

## 2023-08-06 NOTE — Telephone Encounter (Signed)
 Copied from CRM [id number]. Topic: Clinical - Request for Lab/Test Order >> Aug 06, 2023 11:50 AM Jasmin G wrote: Reason for CRM: Pt would like to speak to Ms. Melissa from [facility] to discuss the possibility of  getting another cub, please call her back ASAP

## 2023-08-11 ENCOUNTER — Other Ambulatory Visit: Payer: Self-pay | Admitting: Family Medicine

## 2023-08-11 DIAGNOSIS — F32A Depression, unspecified: Secondary | ICD-10-CM

## 2023-08-23 ENCOUNTER — Ambulatory Visit: Payer: 59 | Admitting: Family Medicine

## 2023-08-23 ENCOUNTER — Encounter: Payer: Self-pay | Admitting: Family Medicine

## 2023-08-23 ENCOUNTER — Other Ambulatory Visit (HOSPITAL_COMMUNITY)
Admission: RE | Admit: 2023-08-23 | Discharge: 2023-08-23 | Disposition: A | Discharge status: Home / Self Care | Source: Ambulatory Visit | Attending: Family Medicine | Admitting: Family Medicine

## 2023-08-23 VITALS — BP 106/74 | HR 71 | Ht 63.0 in | Wt 169.0 lb

## 2023-08-23 DIAGNOSIS — Z1151 Encounter for screening for human papillomavirus (HPV): Secondary | ICD-10-CM | POA: Insufficient documentation

## 2023-08-23 DIAGNOSIS — E785 Hyperlipidemia, unspecified: Secondary | ICD-10-CM

## 2023-08-23 DIAGNOSIS — Z01411 Encounter for gynecological examination (general) (routine) with abnormal findings: Secondary | ICD-10-CM | POA: Diagnosis not present

## 2023-08-23 DIAGNOSIS — Z01419 Encounter for gynecological examination (general) (routine) without abnormal findings: Secondary | ICD-10-CM | POA: Insufficient documentation

## 2023-08-23 DIAGNOSIS — I10 Essential (primary) hypertension: Secondary | ICD-10-CM | POA: Diagnosis not present

## 2023-08-23 DIAGNOSIS — M7061 Trochanteric bursitis, right hip: Secondary | ICD-10-CM

## 2023-08-23 DIAGNOSIS — F32A Depression, unspecified: Secondary | ICD-10-CM

## 2023-08-23 DIAGNOSIS — K2100 Gastro-esophageal reflux disease with esophagitis, without bleeding: Secondary | ICD-10-CM

## 2023-08-23 DIAGNOSIS — M7062 Trochanteric bursitis, left hip: Secondary | ICD-10-CM

## 2023-08-23 DIAGNOSIS — M797 Fibromyalgia: Secondary | ICD-10-CM

## 2023-08-23 DIAGNOSIS — F419 Anxiety disorder, unspecified: Secondary | ICD-10-CM | POA: Diagnosis not present

## 2023-08-23 LAB — CMP14+EGFR
ALT: 43 IU/L — ABNORMAL HIGH (ref 0–32)
AST: 27 IU/L (ref 0–40)
Albumin: 4.1 g/dL (ref 3.9–4.9)
Alkaline Phosphatase: 117 IU/L (ref 44–121)
BUN/Creatinine Ratio: 18 (ref 12–28)
BUN: 19 mg/dL (ref 8–27)
Bilirubin Total: 0.3 mg/dL (ref 0.0–1.2)
CO2: 22 mmol/L (ref 20–29)
Calcium: 9.6 mg/dL (ref 8.7–10.3)
Chloride: 104 mmol/L (ref 96–106)
Creatinine, Ser: 1.04 mg/dL — ABNORMAL HIGH (ref 0.57–1.00)
Globulin, Total: 2.5 g/dL (ref 1.5–4.5)
Glucose: 106 mg/dL — ABNORMAL HIGH (ref 70–99)
Potassium: 4.6 mmol/L (ref 3.5–5.2)
Sodium: 139 mmol/L (ref 134–144)
Total Protein: 6.6 g/dL (ref 6.0–8.5)
eGFR: 61 mL/min/1.73 (ref >59)

## 2023-08-23 LAB — CBC WITH DIFFERENTIAL/PLATELET
Basophils Absolute: 0.1 x10E3/uL (ref 0.0–0.2)
Basos: 1 %
EOS (ABSOLUTE): 0.2 x10E3/uL (ref 0.0–0.4)
Eos: 4 %
Hematocrit: 40.8 % (ref 34.0–46.6)
Hemoglobin: 13.3 g/dL (ref 11.1–15.9)
Immature Grans (Abs): 0 x10E3/uL (ref 0.0–0.1)
Immature Granulocytes: 0 %
Lymphocytes Absolute: 2.5 x10E3/uL (ref 0.7–3.1)
Lymphs: 41 %
MCH: 28.9 pg (ref 26.6–33.0)
MCHC: 32.6 g/dL (ref 31.5–35.7)
MCV: 89 fL (ref 79–97)
Monocytes Absolute: 0.5 x10E3/uL (ref 0.1–0.9)
Monocytes: 8 %
Neutrophils Absolute: 2.8 x10E3/uL (ref 1.4–7.0)
Neutrophils: 46 %
Platelets: 317 x10E3/uL (ref 150–450)
RBC: 4.6 x10E6/uL (ref 3.77–5.28)
RDW: 12.1 % (ref 11.7–15.4)
WBC: 6.1 x10E3/uL (ref 3.4–10.8)

## 2023-08-23 LAB — LIPID PANEL
Chol/HDL Ratio: 1.8 ratio (ref 0.0–4.4)
Cholesterol, Total: 139 mg/dL (ref 100–199)
HDL: 76 mg/dL (ref >39)
LDL Chol Calc (NIH): 48 mg/dL (ref 0–99)
Triglycerides: 78 mg/dL (ref 0–149)
VLDL Cholesterol Cal: 15 mg/dL (ref 5–40)

## 2023-08-23 MED ORDER — FAMOTIDINE 40 MG PO TABS: 40.0000 mg | ORAL_TABLET | Freq: Every day | ORAL | 11 refills | Status: AC

## 2023-08-23 MED ORDER — CITALOPRAM HYDROBROMIDE 40 MG PO TABS
40.0000 mg | ORAL_TABLET | Freq: Every day | ORAL | 0 refills | Status: AC
Start: 2023-08-23 — End: ?

## 2023-08-23 MED ORDER — METHYLPREDNISOLONE ACETATE 80 MG/ML IJ SUSP
80.0000 mg | Freq: Once | INTRAMUSCULAR | Status: AC
  Administered 2023-08-23: 80 mg via INTRA_ARTICULAR

## 2023-08-23 MED ORDER — PREGABALIN 50 MG PO CAPS: 50.0000 mg | ORAL_CAPSULE | Freq: Two times a day (BID) | ORAL | 5 refills | Status: AC

## 2023-08-23 MED ORDER — PANTOPRAZOLE SODIUM 40 MG PO TBEC: 40.0000 mg | DELAYED_RELEASE_TABLET | Freq: Two times a day (BID) | ORAL | 3 refills | Status: AC

## 2023-08-23 MED ORDER — ATORVASTATIN CALCIUM 20 MG PO TABS
20.0000 mg | ORAL_TABLET | Freq: Every day | ORAL | 3 refills | Status: AC
Start: 2023-08-23 — End: ?

## 2023-08-23 MED ORDER — LISINOPRIL 10 MG PO TABS
10.0000 mg | ORAL_TABLET | Freq: Every day | ORAL | 3 refills | Status: AC
Start: 2023-08-23 — End: ?

## 2023-08-23 NOTE — Progress Notes (Signed)
 BP 106/74   Pulse 71   Ht 5' 3 (1.6 m)   Wt 169 lb (76.7 kg)   LMP 04/14/2012   SpO2 97%   BMI 29.94 kg/m    Subjective:   Patient ID: Brianna [name], female    DOB: 07/18/1960, 63 y.o.   MRN: [id number]  HPI: Brianna [name] is a 63 y.o. female presenting on 08/23/2023 for Annual Exam (With pap)   Discussed the use of AI scribe software for clinical note transcription with the patient, who gave verbal consent to proceed.  History of Present Illness   Brianna [name] is a 63 year old female who presents for a well woman exam and recheck of chronic medical issues. She is accompanied by her son, the father of her grandbabies.  She is here for a well woman exam, including a pelvic exam, Pap smear, and breast exam. She also seeks a recheck of her chronic medical issues, including hypertension, anxiety, depression, fibromyalgia, and GERD.  For fibromyalgia, she takes Lyrica  (pregabalin ) and has recently increased her dosage from two to four pills per day due to worsening pain in her hips and legs, particularly at night. The pain is described as different from her usual muscle aches, located on the outside of her hips, and worsens when lying on her side. She has been using Lyrica  inconsistently, spacing it out and using it as needed, which she feels has been effective.  Regarding her anxiety and depression, she continues to take Celexa  and reports doing 'pretty good' despite recent stressors, including her son's hospitalization and another son's upcoming release from prison. She manages her stress with the medication and her grandbabies, who she cares for regularly.  Her hypertension is managed with lisinopril . She reports occasional lightheadedness, particularly when standing up quickly, which has been more frequent recently. She has experienced episodes of dizziness, such as at a family reunion.  For GERD, she takes famotidine  and pantoprazole , which control her symptoms well as  long as she takes them consistently. She also takes atorvastatin  for cholesterol management.  She mentions a past negative experience with a mammogram and biopsy, which has made her hesitant to undergo future mammograms. Her family history includes an aunt who died of breast cancer, prompting her to perform regular self-breast exams.  No vaginal discharge or irritation. She reports issues with urgency in urination and defecation, sometimes not reaching the bathroom in time.          Relevant past medical, surgical, family and social history reviewed and updated as indicated. Interim medical history since our last visit reviewed. Allergies and medications reviewed and updated.  Review of Systems  Constitutional:  Negative for chills and fever.  HENT:  Negative for congestion, ear discharge and ear pain.   Eyes:  Negative for redness and visual disturbance.  Respiratory:  Negative for chest tightness and shortness of breath.   Cardiovascular:  Negative for chest pain and leg swelling.  Genitourinary:  Negative for difficulty urinating and dysuria.  Musculoskeletal:  Negative for back pain and gait problem.  Skin:  Negative for rash.  Neurological:  Negative for dizziness, light-headedness and headaches.  Psychiatric/Behavioral:  Negative for agitation and behavioral problems.   All other systems reviewed and are negative.   Per HPI unless specifically indicated above   Allergies as of 08/23/2023   No Known Allergies      Medication List        Accurate as of August 23, 2023  9:43 AM. If you have any questions, ask your nurse or doctor.          STOP taking these medications    amoxicillin  500 MG capsule Commonly known as: AMOXIL  Stopped by: Fonda LABOR Anushri Casalino       TAKE these medications    atorvastatin  20 MG tablet Commonly known as: LIPITOR Take 1 tablet (20 mg total) by mouth daily.   citalopram  40 MG tablet Commonly known as: CELEXA  Take 1 tablet (40 mg  total) by mouth daily.   famotidine  40 MG tablet Commonly known as: PEPCID  Take 1 tablet (40 mg total) by mouth at bedtime. What changed:  when to take this reasons to take this   fluticasone  50 MCG/ACT nasal spray Commonly known as: FLONASE  Place 1 spray into both nostrils 2 (two) times daily as needed for allergies or rhinitis.   furosemide  20 MG tablet Commonly known as: LASIX  Take 1 tablet (20 mg total) by mouth daily as needed for edema.   hyoscyamine  0.125 MG SL tablet Commonly known as: LEVSIN  SL DISSOLVE 1 TABLET(0.125 MG) UNDER THE TONGUE FOUR TIMES DAILY BEFORE MEALS AND AT BEDTIME   ibuprofen  800 MG tablet Commonly known as: ADVIL  Take 800 mg by mouth every 8 (eight) hours as needed for moderate pain.   lisinopril  10 MG tablet Commonly known as: ZESTRIL  Take 1 tablet (10 mg total) by mouth daily.   loratadine  10 MG tablet Commonly known as: CLARITIN  TAKE 1 TABLET BY MOUTH DAILY   pantoprazole  40 MG tablet Commonly known as: PROTONIX  Take 1 tablet (40 mg total) by mouth 2 (two) times daily.   pregabalin  50 MG capsule Commonly known as: LYRICA  Take 1 capsule (50 mg total) by mouth 2 (two) times daily.         Objective:   BP 106/74   Pulse 71   Ht 5' 3 (1.6 m)   Wt 169 lb (76.7 kg)   LMP 04/14/2012   SpO2 97%   BMI 29.94 kg/m   Wt Readings from Last 3 Encounters:  08/23/23 169 lb (76.7 kg)  04/28/23 171 lb (77.6 kg)  03/02/23 176 lb (79.8 kg)    Physical Exam Musculoskeletal:     Right hip: Tenderness (Lateral tenderness and location of the trochanteric bursa) present.     Left hip: Tenderness present.    Physical Exam   VITALS: BP- 106/ CHEST: Lungs clear to auscultation. CARDIOVASCULAR: Heart sounds regular. BREAST: Breasts symmetrical, no masses, no tenderness. ABDOMEN: Abdomen non-tender, no masses. GENITOURINARY: External vagina without lesions. Cervix non-tender, no lesions. MUSCULOSKELETAL: Right hip tender on palpation. Pain  on hip flexion and adduction.       Right trochanteric bursitis injection: Consent form signed. Risk factors of bleeding and infection discussed with patient and patient is agreeable towards injection. Patient prepped with Betadine . Lateral approach towards injection used. Injected 80 mg of Depo-Medrol  and 1 mL of 2% lidocaine . Patient tolerated procedure well and no side effects from noted. Minimal to no bleeding. Simple bandage applied after.   Assessment & Plan:   Problem List Items Addressed This Visit       Cardiovascular and Mediastinum   Essential hypertension, benign   Relevant Medications   atorvastatin  (LIPITOR) 20 MG tablet   lisinopril  (ZESTRIL ) 10 MG tablet     Digestive   GERD (gastroesophageal reflux disease)   Relevant Medications   famotidine  (PEPCID ) 40 MG tablet   pantoprazole  (PROTONIX ) 40 MG tablet   Other Relevant Orders  CBC with Differential/Platelet     Other   Anxiety and depression   Relevant Medications   citalopram  (CELEXA ) 40 MG tablet   Other Relevant Orders   CBC with Differential/Platelet   CMP14+EGFR   Hyperlipidemia   Relevant Medications   atorvastatin  (LIPITOR) 20 MG tablet   lisinopril  (ZESTRIL ) 10 MG tablet   Other Relevant Orders   Lipid panel   Fibromyalgia   Relevant Medications   citalopram  (CELEXA ) 40 MG tablet   pregabalin  (LYRICA ) 50 MG capsule   methylPREDNISolone  acetate (DEPO-MEDROL ) injection 80 mg (Start on 08/23/2023  9:45 AM)   Other Relevant Orders   ToxASSURE [facility] 13 (MW), Urine   Other Visit Diagnoses       Well woman exam with routine gynecological exam    -  Primary     Trochanteric bursitis of both hips       Relevant Medications   methylPREDNISolone  acetate (DEPO-MEDROL ) injection 80 mg (Start on 08/23/2023  9:45 AM)          Well Woman Visit Discussed regular mammograms and self-breast exams due to family history of breast cancer. - Schedule mammogram. - Continue self-breast exams.  Bilateral hip  trochanteric bursitis Exacerbation of bilateral hip pain, particularly on the right, due to trochanteric bursitis. Pain not related to fibromyalgia. - Administer cortisone injection in the right hip. - Perform daily hip stretches. - Use a rolling pin or frozen water  bottle on the outside of the hips daily.  Fibromyalgia Chronic condition managed with pregabalin . Recent dosage increase due to hip pain attributed to bursitis. Noted inconsistent pregabalin  use. - Renew pregabalin  prescription. - Renew controlled substance contract and drug screen.  Hypertension Blood pressure lower than usual at 106/66 mmHg. Lightheadedness possibly related to increased Lyrica  dosage. - Monitor blood pressure closely. - Adjust hypertension medication if necessary based on future assessments.  Major depressive disorder and anxiety disorder Symptoms well-managed with Celexa  despite recent stress increase. - Continue Celexa . - Monitor mental health status and adjust treatment if stress persists.  Gastroesophageal reflux disease (GERD) Symptoms well-controlled with famotidine  and pantoprazole . - Continue famotidine  and pantoprazole .  Hyperlipidemia Managed with atorvastatin . Discussed statins' role in preventing cardiovascular events. - Continue atorvastatin . - Check cholesterol levels with blood work.  Urinary and fecal urgency with occasional incontinence Reports occasional urgency and incontinence.          Follow up plan: Return in about 6 months (around 02/23/2024), or if symptoms worsen or fail to improve, for Fibromyalgia and hypertension recheck.  Counseling provided for all of the vaccine components Orders Placed This Encounter  Procedures   CBC with Differential/Platelet   CMP14+EGFR   Lipid panel   ToxASSURE Select 13 (MW), Urine    Fonda Levins, MD [facility] 08/23/2023, 9:43 AM

## 2023-08-25 LAB — TOXASSURE SELECT 13 (MW), URINE

## 2023-08-27 LAB — CYTOLOGY - PAP
Comment: NEGATIVE
Diagnosis: NEGATIVE
High risk HPV: NEGATIVE

## 2023-08-30 ENCOUNTER — Ambulatory Visit: Payer: Self-pay | Admitting: Family Medicine

## 2023-09-02 ENCOUNTER — Other Ambulatory Visit: Payer: Self-pay | Admitting: Family Medicine

## 2023-10-07 ENCOUNTER — Telehealth: Payer: Self-pay | Admitting: Family Medicine

## 2023-10-07 NOTE — Telephone Encounter (Signed)
 Copied from CRM #[id number]. Topic: Clinical - Prescription Issue >> Oct 07, 2023  9:34 AM Wyona SQUIBB wrote: Reason for CRM: [facility] sent request to have all medication sent and refilled through [facility], fax will be sent and would like to receive a reply   Callback [phone]

## 2023-10-18 ENCOUNTER — Ambulatory Visit
Admission: RE | Admit: 2023-10-18 | Discharge: 2023-10-18 | Disposition: A | Discharge status: Home / Self Care | Source: Ambulatory Visit | Attending: Family Medicine | Admitting: Family Medicine

## 2023-10-18 ENCOUNTER — Other Ambulatory Visit: Payer: Self-pay | Admitting: Family Medicine

## 2023-10-18 DIAGNOSIS — Z1231 Encounter for screening mammogram for malignant neoplasm of breast: Secondary | ICD-10-CM

## 2023-12-22 ENCOUNTER — Ambulatory Visit: Payer: Self-pay

## 2023-12-22 NOTE — Progress Notes (Unsigned)
 This encounter was created in error - please disregard.

## 2023-12-23 ENCOUNTER — Ambulatory Visit

## 2024-02-23 ENCOUNTER — Ambulatory Visit: Payer: Self-pay | Admitting: Family Medicine

## 2024-08-23 ENCOUNTER — Encounter: Payer: Self-pay | Admitting: Family Medicine
